# Patient Record
Sex: Female | Born: 1965
Health system: Southern US, Community
[De-identification: ages and names within clinical notes are randomized; demographics above are authoritative.]

## PROBLEM LIST (undated history)

## (undated) DIAGNOSIS — G8929 Other chronic pain: Secondary | ICD-10-CM

## (undated) DIAGNOSIS — E119 Type 2 diabetes mellitus without complications: Secondary | ICD-10-CM

## (undated) DIAGNOSIS — J40 Bronchitis, not specified as acute or chronic: Secondary | ICD-10-CM

## (undated) DIAGNOSIS — M549 Dorsalgia, unspecified: Secondary | ICD-10-CM

## (undated) DIAGNOSIS — F32A Depression, unspecified: Secondary | ICD-10-CM

## (undated) DIAGNOSIS — F329 Major depressive disorder, single episode, unspecified: Secondary | ICD-10-CM

## (undated) DIAGNOSIS — M1990 Unspecified osteoarthritis, unspecified site: Secondary | ICD-10-CM

## (undated) HISTORY — PX: CHOLECYSTECTOMY: SHX55

## (undated) HISTORY — PX: TUBAL LIGATION: SHX77

---

## 1997-12-21 ENCOUNTER — Ambulatory Visit (HOSPITAL_BASED_OUTPATIENT_CLINIC_OR_DEPARTMENT_OTHER): Admission: RE | Admit: 1997-12-21 | Discharge: 1997-12-21 | Payer: Self-pay | Admitting: Orthopedic Surgery

## 1998-01-25 ENCOUNTER — Ambulatory Visit (HOSPITAL_BASED_OUTPATIENT_CLINIC_OR_DEPARTMENT_OTHER): Admission: RE | Admit: 1998-01-25 | Discharge: 1998-01-25 | Payer: Self-pay | Admitting: Orthopedic Surgery

## 1998-10-10 ENCOUNTER — Other Ambulatory Visit: Admission: RE | Admit: 1998-10-10 | Discharge: 1998-10-10 | Payer: Self-pay | Admitting: *Deleted

## 1998-12-09 ENCOUNTER — Inpatient Hospital Stay (HOSPITAL_COMMUNITY): Admission: AD | Admit: 1998-12-09 | Discharge: 1998-12-09 | Payer: Self-pay | Admitting: Obstetrics

## 1999-01-14 ENCOUNTER — Inpatient Hospital Stay (HOSPITAL_COMMUNITY): Admission: AD | Admit: 1999-01-14 | Discharge: 1999-01-14 | Payer: Self-pay | Admitting: Obstetrics

## 1999-04-24 ENCOUNTER — Inpatient Hospital Stay (HOSPITAL_COMMUNITY): Admission: AD | Admit: 1999-04-24 | Discharge: 1999-04-24 | Payer: Self-pay | Admitting: Obstetrics

## 1999-04-26 ENCOUNTER — Inpatient Hospital Stay (HOSPITAL_COMMUNITY): Admission: AD | Admit: 1999-04-26 | Discharge: 1999-04-30 | Payer: Self-pay | Admitting: Obstetrics

## 1999-04-26 ENCOUNTER — Encounter (INDEPENDENT_AMBULATORY_CARE_PROVIDER_SITE_OTHER): Payer: Self-pay | Admitting: Specialist

## 1999-08-31 ENCOUNTER — Emergency Department (HOSPITAL_COMMUNITY): Admission: EM | Admit: 1999-08-31 | Discharge: 1999-08-31 | Payer: Self-pay | Admitting: Emergency Medicine

## 2000-03-14 ENCOUNTER — Emergency Department (HOSPITAL_COMMUNITY): Admission: EM | Admit: 2000-03-14 | Discharge: 2000-03-14 | Payer: Self-pay | Admitting: *Deleted

## 2000-07-09 ENCOUNTER — Emergency Department (HOSPITAL_COMMUNITY): Admission: EM | Admit: 2000-07-09 | Discharge: 2000-07-09 | Payer: Self-pay | Admitting: Emergency Medicine

## 2003-03-21 ENCOUNTER — Encounter: Payer: Self-pay | Admitting: Emergency Medicine

## 2003-03-21 ENCOUNTER — Emergency Department (HOSPITAL_COMMUNITY): Admission: EM | Admit: 2003-03-21 | Discharge: 2003-03-21 | Payer: Self-pay | Admitting: Emergency Medicine

## 2003-11-21 ENCOUNTER — Emergency Department (HOSPITAL_COMMUNITY): Admission: AD | Admit: 2003-11-21 | Discharge: 2003-11-21 | Payer: Self-pay | Admitting: Family Medicine

## 2003-11-24 ENCOUNTER — Emergency Department (HOSPITAL_COMMUNITY): Admission: AD | Admit: 2003-11-24 | Discharge: 2003-11-24 | Payer: Self-pay | Admitting: Family Medicine

## 2005-05-16 ENCOUNTER — Encounter: Admission: RE | Admit: 2005-05-16 | Discharge: 2005-05-16 | Payer: Self-pay | Admitting: Occupational Medicine

## 2005-07-05 ENCOUNTER — Emergency Department (HOSPITAL_COMMUNITY): Admission: EM | Admit: 2005-07-05 | Discharge: 2005-07-05 | Payer: Self-pay | Admitting: Family Medicine

## 2006-07-08 ENCOUNTER — Emergency Department (HOSPITAL_COMMUNITY): Admission: EM | Admit: 2006-07-08 | Discharge: 2006-07-08 | Payer: Self-pay | Admitting: Family Medicine

## 2006-09-04 ENCOUNTER — Emergency Department (HOSPITAL_COMMUNITY): Admission: EM | Admit: 2006-09-04 | Discharge: 2006-09-04 | Payer: Self-pay | Admitting: Family Medicine

## 2006-12-25 ENCOUNTER — Emergency Department (HOSPITAL_COMMUNITY): Admission: EM | Admit: 2006-12-25 | Discharge: 2006-12-25 | Payer: Self-pay | Admitting: Family Medicine

## 2007-01-04 ENCOUNTER — Emergency Department (HOSPITAL_COMMUNITY): Admission: EM | Admit: 2007-01-04 | Discharge: 2007-01-04 | Payer: Self-pay | Admitting: Emergency Medicine

## 2007-03-21 ENCOUNTER — Emergency Department (HOSPITAL_COMMUNITY): Admission: EM | Admit: 2007-03-21 | Discharge: 2007-03-21 | Payer: Self-pay | Admitting: Family Medicine

## 2007-11-19 ENCOUNTER — Emergency Department (HOSPITAL_COMMUNITY): Admission: EM | Admit: 2007-11-19 | Discharge: 2007-11-19 | Payer: Self-pay | Admitting: Family Medicine

## 2007-12-12 ENCOUNTER — Emergency Department (HOSPITAL_COMMUNITY): Admission: EM | Admit: 2007-12-12 | Discharge: 2007-12-12 | Payer: Self-pay | Admitting: Family Medicine

## 2008-03-02 ENCOUNTER — Ambulatory Visit (HOSPITAL_COMMUNITY): Admission: RE | Admit: 2008-03-02 | Discharge: 2008-03-02 | Payer: Self-pay | Admitting: Family Medicine

## 2009-05-30 ENCOUNTER — Emergency Department (HOSPITAL_COMMUNITY): Admission: EM | Admit: 2009-05-30 | Discharge: 2009-05-30 | Payer: Self-pay | Admitting: Emergency Medicine

## 2009-06-10 ENCOUNTER — Emergency Department (HOSPITAL_COMMUNITY): Admission: EM | Admit: 2009-06-10 | Discharge: 2009-06-10 | Payer: Self-pay | Admitting: Family Medicine

## 2010-06-27 ENCOUNTER — Emergency Department (HOSPITAL_COMMUNITY): Admission: EM | Admit: 2010-06-27 | Discharge: 2010-06-27 | Payer: Self-pay | Admitting: Family Medicine

## 2010-07-19 ENCOUNTER — Emergency Department (HOSPITAL_COMMUNITY): Admission: EM | Admit: 2010-07-19 | Discharge: 2010-07-19 | Payer: Self-pay | Admitting: Family Medicine

## 2010-11-19 ENCOUNTER — Emergency Department (HOSPITAL_COMMUNITY)
Admission: EM | Admit: 2010-11-19 | Discharge: 2010-11-20 | Disposition: A | Discharge status: Home / Self Care | Payer: BC Managed Care – PPO | Attending: Emergency Medicine | Admitting: Emergency Medicine

## 2010-11-19 DIAGNOSIS — M545 Low back pain, unspecified: Secondary | ICD-10-CM | POA: Insufficient documentation

## 2010-11-19 DIAGNOSIS — R52 Pain, unspecified: Secondary | ICD-10-CM

## 2010-11-19 DIAGNOSIS — R109 Unspecified abdominal pain: Secondary | ICD-10-CM | POA: Insufficient documentation

## 2010-11-19 DIAGNOSIS — N898 Other specified noninflammatory disorders of vagina: Secondary | ICD-10-CM | POA: Insufficient documentation

## 2010-11-19 LAB — WET PREP, GENITAL
Clue Cells Wet Prep HPF POC: NONE SEEN
Trich, Wet Prep: NONE SEEN
Yeast Wet Prep HPF POC: NONE SEEN

## 2010-11-20 ENCOUNTER — Encounter (HOSPITAL_COMMUNITY): Payer: Self-pay

## 2010-11-20 ENCOUNTER — Emergency Department (HOSPITAL_COMMUNITY): Payer: BC Managed Care – PPO

## 2010-11-20 LAB — URINE MICROSCOPIC-ADD ON

## 2010-11-20 LAB — COMPREHENSIVE METABOLIC PANEL
ALT: 25 U/L (ref 0–35)
Albumin: 3.5 g/dL (ref 3.5–5.2)
Alkaline Phosphatase: 56 U/L (ref 39–117)
BUN: 15 mg/dL (ref 6–23)
Calcium: 8.9 mg/dL (ref 8.4–10.5)
Chloride: 105 mEq/L (ref 96–112)
Creatinine, Ser: 0.64 mg/dL (ref 0.4–1.2)
GFR calc non Af Amer: >60 mL/min (ref >60)
Glucose, Bld: 123 mg/dL — ABNORMAL HIGH (ref 70–99)
Potassium: 4.1 mEq/L (ref 3.5–5.1)
Total Protein: 7.1 g/dL (ref 6.0–8.3)

## 2010-11-20 LAB — URINALYSIS, ROUTINE W REFLEX MICROSCOPIC
Bilirubin Urine: NEGATIVE
Glucose, UA: NEGATIVE mg/dL
Ketones, ur: NEGATIVE mg/dL
Nitrite: NEGATIVE
Protein, ur: NEGATIVE mg/dL
Urobilinogen, UA: 1 mg/dL (ref 0.0–1.0)
pH: 7 (ref 5.0–8.0)

## 2010-11-20 LAB — CBC
HCT: 37.8 % (ref 36.0–46.0)
Hemoglobin: 12.2 g/dL (ref 12.0–15.0)
MCH: 27.5 pg (ref 26.0–34.0)
MCHC: 32.3 g/dL (ref 30.0–36.0)
MCV: 85.3 fL (ref 78.0–100.0)
Platelets: 184 10*3/uL (ref 150–400)
RBC: 4.43 MIL/uL (ref 3.87–5.11)
RDW: 14 % (ref 11.5–15.5)

## 2010-11-20 LAB — DIFFERENTIAL
Basophils Absolute: 0 10*3/uL (ref 0.0–0.1)
Basophils Relative: 0 % (ref 0–1)
Eosinophils Absolute: 0.2 10*3/uL (ref 0.0–0.7)
Eosinophils Relative: 2 % (ref 0–5)
Lymphocytes Relative: 33 % (ref 12–46)
Lymphs Abs: 2.5 10*3/uL (ref 0.7–4.0)
Monocytes Absolute: 0.4 10*3/uL (ref 0.1–1.0)
Monocytes Relative: 5 % (ref 3–12)
Neutrophils Relative %: 59 % (ref 43–77)

## 2010-11-20 LAB — POCT PREGNANCY, URINE: Preg Test, Ur: NEGATIVE

## 2010-11-20 LAB — LIPASE, BLOOD: Lipase: 68 U/L — ABNORMAL HIGH (ref 11–59)

## 2010-11-21 LAB — GC/CHLAMYDIA PROBE AMP, GENITAL
Chlamydia, DNA Probe: NEGATIVE
GC Probe Amp, Genital: NEGATIVE

## 2010-12-25 ENCOUNTER — Inpatient Hospital Stay (INDEPENDENT_AMBULATORY_CARE_PROVIDER_SITE_OTHER)
Admission: RE | Admit: 2010-12-25 | Discharge: 2010-12-25 | Disposition: A | Discharge status: Home / Self Care | Payer: BC Managed Care – PPO | Source: Ambulatory Visit | Attending: Family Medicine | Admitting: Family Medicine

## 2010-12-25 DIAGNOSIS — M542 Cervicalgia: Secondary | ICD-10-CM

## 2010-12-25 DIAGNOSIS — S139XXA Sprain of joints and ligaments of unspecified parts of neck, initial encounter: Secondary | ICD-10-CM

## 2011-06-04 LAB — POCT RAPID STREP A: Streptococcus, Group A Screen (Direct): NEGATIVE

## 2011-06-21 ENCOUNTER — Ambulatory Visit (INDEPENDENT_AMBULATORY_CARE_PROVIDER_SITE_OTHER): Payer: BC Managed Care – PPO

## 2011-06-21 ENCOUNTER — Inpatient Hospital Stay (INDEPENDENT_AMBULATORY_CARE_PROVIDER_SITE_OTHER)
Admission: RE | Admit: 2011-06-21 | Discharge: 2011-06-21 | Disposition: A | Discharge status: Home / Self Care | Payer: BC Managed Care – PPO | Source: Ambulatory Visit | Attending: Family Medicine | Admitting: Family Medicine

## 2011-06-21 DIAGNOSIS — J45909 Unspecified asthma, uncomplicated: Secondary | ICD-10-CM

## 2011-06-25 ENCOUNTER — Inpatient Hospital Stay (INDEPENDENT_AMBULATORY_CARE_PROVIDER_SITE_OTHER)
Admission: RE | Admit: 2011-06-25 | Discharge: 2011-06-25 | Disposition: A | Discharge status: Home / Self Care | Payer: BC Managed Care – PPO | Source: Ambulatory Visit | Attending: Family Medicine | Admitting: Family Medicine

## 2011-06-25 DIAGNOSIS — J45909 Unspecified asthma, uncomplicated: Secondary | ICD-10-CM

## 2011-07-31 ENCOUNTER — Emergency Department (INDEPENDENT_AMBULATORY_CARE_PROVIDER_SITE_OTHER)
Admission: EM | Admit: 2011-07-31 | Discharge: 2011-07-31 | Disposition: A | Discharge status: Home / Self Care | Payer: BC Managed Care – PPO | Source: Home / Self Care | Attending: Emergency Medicine | Admitting: Emergency Medicine

## 2011-07-31 ENCOUNTER — Encounter (HOSPITAL_COMMUNITY): Payer: Self-pay | Admitting: *Deleted

## 2011-07-31 DIAGNOSIS — J3489 Other specified disorders of nose and nasal sinuses: Secondary | ICD-10-CM

## 2011-07-31 DIAGNOSIS — R0981 Nasal congestion: Secondary | ICD-10-CM

## 2011-07-31 DIAGNOSIS — R05 Cough: Secondary | ICD-10-CM

## 2011-07-31 HISTORY — DX: Depression, unspecified: F32.A

## 2011-07-31 HISTORY — DX: Unspecified osteoarthritis, unspecified site: M19.90

## 2011-07-31 HISTORY — DX: Major depressive disorder, single episode, unspecified: F32.9

## 2011-07-31 HISTORY — DX: Bronchitis, not specified as acute or chronic: J40

## 2011-07-31 MED ORDER — FEXOFENADINE HCL 60 MG PO TABS: 180.0000 mg | ORAL_TABLET | Freq: Every day | ORAL | Status: DC

## 2011-07-31 MED ORDER — PREDNISONE 20 MG PO TABS: 40.0000 mg | ORAL_TABLET | Freq: Every day | ORAL | Status: AC

## 2011-07-31 MED ORDER — FEXOFENADINE HCL 60 MG PO TABS: 180.0000 mg | ORAL_TABLET | Freq: Once | ORAL | Status: DC

## 2011-07-31 MED ORDER — FEXOFENADINE HCL 60 MG PO TABS: 60.0000 mg | ORAL_TABLET | Freq: Two times a day (BID) | ORAL | Status: DC

## 2011-07-31 NOTE — ED Notes (Signed)
Pt reports having  Bronchitis  several weeks ago.  She did get some better, but today c/o pressure in her sinus, productive cough--thick sputum with streaks of blood--, achy back, neck and  Shoulders.  denies fever.  She tried albuterol inhaler with only partial relief

## 2011-07-31 NOTE — ED Provider Notes (Signed)
History     CSN: [id number] Arrival date & time: 07/31/2011 10:20 AM   First MD Initiated Contact with Patient 07/31/11 [phone]      Chief Complaint  Patient presents with  . URI    (HPI Comments: Pressure between my nose and head congested" x 2 days coughing up with streaks of blood and green looking phlegm, can't see my doctor cause i ow them 100 dollars"  Patient is a 45 y.o. female presenting with URI. The history is provided by the patient. No language interpreter was used.  URI The primary symptoms include headaches, sore throat and cough. Primary symptoms do not include fever, fatigue, ear pain, swollen glands, wheezing, nausea, vomiting, myalgias, arthralgias or rash. The current episode started 2 days ago. This is a new problem.  Symptoms associated with the illness include facial pain, sinus pressure, congestion and rhinorrhea. The illness is not associated with chills or plugged ear sensation.    Past Medical History  Diagnosis Date  . Bronchitis   . Depression   . Osteoarthritis     Past Surgical History  Procedure Date  . Cholecystectomy   . Cesarean section   . Tubal ligation     Family History  Problem Relation Age of Onset  . Cancer Father     History  Substance Use Topics  . Smoking status: Former Smoker    Quit date: 07/31/2007  . Smokeless tobacco: Not on file  . Alcohol Use: No    OB History    Grav Para Term Preterm Abortions TAB SAB Ect Mult Living                  Review of Systems  Constitutional: Negative for fever, chills and fatigue.  HENT: Positive for congestion, sore throat, rhinorrhea, postnasal drip and sinus pressure. Negative for ear pain and sneezing.   Respiratory: Positive for cough. Negative for chest tightness and wheezing.   Gastrointestinal: Negative for nausea and vomiting.  Musculoskeletal: Negative for myalgias and arthralgias.  Skin: Negative for rash.  Neurological: Positive for headaches.    Allergies  Biaxin  and Sulfa antibiotics  Home Medications   Current Outpatient Rx  Name Route Sig Dispense Refill  . ALBUTEROL SULFATE HFA 108 (90 BASE) MCG/ACT IN AERS Inhalation Inhale 2 puffs into the lungs every 6 (six) hours as needed.      Marland Kitchen CITALOPRAM HYDROBROMIDE 10 MG PO TABS Oral Take 10 mg by mouth daily.      Marland Kitchen DICLOFENAC SODIUM 25 MG PO TBEC Oral Take 25 mg by mouth.      . TRAMADOL HCL 50 MG PO TABS Oral Take 50 mg by mouth every 6 (six) hours as needed. Maximum dose= 8 tablets per day       BP 128/70  Pulse 88  Temp(Src) 97.9 F (36.6 C) (Oral)  Resp 18  SpO2 100%  Physical Exam  Constitutional: No distress.  HENT:  Head: Normocephalic.  Mouth/Throat: No oropharyngeal exudate.  Eyes: Conjunctivae are normal.  Neck: Trachea normal and normal range of motion. No mass present.  Cardiovascular: Normal rate.   Pulmonary/Chest: Effort normal and breath sounds normal. No respiratory distress. She has no wheezes. She has no rales.  Lymphadenopathy:    She has no cervical adenopathy.  Neurological: She is alert.    ED Course  Procedures (including critical care time)  Labs Reviewed - No data to display No results found.   No diagnosis found.    MDM  Cough-sinus  congestion x 2 days- requesting work note-        Jimmie Molly, MD 07/31/11 [phone]

## 2011-07-31 NOTE — ED Notes (Signed)
[facility] pharmacy sent fax asking to verify quantity on Allegra.  Dr. Artis Flock said to change it to # 30.  I called [phone] and gave information to the Pharmacist. Vassie Moselle 07/31/2011

## 2011-10-10 ENCOUNTER — Emergency Department (INDEPENDENT_AMBULATORY_CARE_PROVIDER_SITE_OTHER)
Admission: EM | Admit: 2011-10-10 | Discharge: 2011-10-10 | Disposition: A | Discharge status: Home / Self Care | Payer: BC Managed Care – PPO | Source: Home / Self Care | Attending: Family Medicine | Admitting: Family Medicine

## 2011-10-10 ENCOUNTER — Encounter (HOSPITAL_COMMUNITY): Payer: Self-pay | Admitting: *Deleted

## 2011-10-10 DIAGNOSIS — F32A Depression, unspecified: Secondary | ICD-10-CM

## 2011-10-10 DIAGNOSIS — F329 Major depressive disorder, single episode, unspecified: Secondary | ICD-10-CM

## 2011-10-10 DIAGNOSIS — G8929 Other chronic pain: Secondary | ICD-10-CM

## 2011-10-10 DIAGNOSIS — M549 Dorsalgia, unspecified: Secondary | ICD-10-CM

## 2011-10-10 MED ORDER — CITALOPRAM HYDROBROMIDE 10 MG PO TABS: 10.0000 mg | ORAL_TABLET | Freq: Every day | ORAL | Status: DC

## 2011-10-10 MED ORDER — TRAMADOL HCL 50 MG PO TABS: 50.0000 mg | ORAL_TABLET | Freq: Four times a day (QID) | ORAL | Status: DC | PRN

## 2011-10-10 MED ORDER — DICLOFENAC SODIUM 50 MG PO TBEC: 50.0000 mg | DELAYED_RELEASE_TABLET | Freq: Three times a day (TID) | ORAL | Status: AC | PRN

## 2011-10-10 NOTE — ED Provider Notes (Signed)
History     CSN: [id number]  Arrival date & time 10/10/11  0865   First MD Initiated Contact with Patient 10/10/11 [phone]      Chief Complaint  Patient presents with  . Headache  . Leg Pain  . Back Pain    (Consider location/radiation/quality/duration/timing/severity/associated sxs/prior treatment) HPI Comments: Amanda Larson presents for refill of her chronic medications. She is in the process of changing providers after her recent practice refused to see her secondary to scheduling conflicts and outstanding payments. She reports a hx of chronic back pain, secondary to arthritis, and depression. She takes diclofenac daily and citalopram. She uses tramadol as needed. She denies any exacerbation or change in her chronic symptoms.   Patient is a 46 y.o. female presenting with back pain. The history is provided by the patient.  Back Pain  This is a chronic problem. The current episode started more than 1 week ago. The problem occurs constantly. The problem has not changed since onset.The pain is associated with no known injury. The pain is moderate. The symptoms are aggravated by bending and twisting. Pertinent negatives include no numbness, no bowel incontinence, no perianal numbness, no paresthesias, no paresis, no tingling and no weakness. She has tried analgesics and NSAIDs for the symptoms. Risk factors include obesity and lack of exercise.    Past Medical History  Diagnosis Date  . Bronchitis   . Depression   . Osteoarthritis     Past Surgical History  Procedure Date  . Cholecystectomy   . Cesarean section   . Tubal ligation     Family History  Problem Relation Age of Onset  . Cancer Father     History  Substance Use Topics  . Smoking status: Former Smoker    Quit date: 07/31/2007  . Smokeless tobacco: Not on file  . Alcohol Use: No    OB History    Grav Para Term Preterm Abortions TAB SAB Ect Mult Living                  Review of Systems  Constitutional:  Negative.   HENT: Negative.   Eyes: Negative.   Respiratory: Negative.   Cardiovascular: Negative.   Gastrointestinal: Negative.  Negative for bowel incontinence.  Genitourinary: Negative.   Musculoskeletal: Positive for back pain.  Skin: Negative.   Neurological: Negative.  Negative for tingling, weakness, numbness and paresthesias.  Psychiatric/Behavioral: Negative.     Allergies  Biaxin and Sulfa antibiotics  Home Medications   Current Outpatient Rx  Name Route Sig Dispense Refill  . ALBUTEROL SULFATE HFA 108 (90 BASE) MCG/ACT IN AERS Inhalation Inhale 2 puffs into the lungs every 6 (six) hours as needed.      Marland Kitchen FEXOFENADINE HCL 60 MG PO TABS Oral Take 1 tablet (60 mg total) by mouth 2 (two) times daily. X 10 days 20 tablet 0  . IBUPROFEN 800 MG PO TABS Oral Take 800 mg by mouth every 8 (eight) hours as needed. Taking 5 at a time    . CITALOPRAM HYDROBROMIDE 10 MG PO TABS Oral Take 1 tablet (10 mg total) by mouth daily. 30 tablet 0  . DICLOFENAC SODIUM 50 MG PO TBEC Oral Take 1 tablet (50 mg total) by mouth 3 (three) times daily as needed. 60 tablet 0  . TRAMADOL HCL 50 MG PO TABS Oral Take 1 tablet (50 mg total) by mouth every 6 (six) hours as needed. Maximum dose= 8 tablets per day 30 tablet 0    BP  133/88  Pulse 89  Temp(Src) 98.8 F (37.1 C) (Oral)  Resp 20  SpO2 96%  Physical Exam  Nursing note and vitals reviewed. Constitutional: She is oriented to person, place, and time. She appears well-developed and well-nourished.  HENT:  Head: Normocephalic and atraumatic.  Eyes: EOM are normal.  Neck: Normal range of motion.  Pulmonary/Chest: Effort normal.  Musculoskeletal: Normal range of motion.       Lumbar back: She exhibits tenderness, bony tenderness and pain. She exhibits no swelling and no spasm.  Neurological: She is alert and oriented to person, place, and time.  Skin: Skin is warm and dry.  Psychiatric: Her behavior is normal.    ED Course  Procedures  (including critical care time)  Labs Reviewed - No data to display No results found.   1. Chronic back pain   2. Depression       MDM  Refilled diclofenac, citalopram, and tramadol; referred to [facility]        Richardo Priest, MD 10/10/11 [phone]

## 2011-10-10 NOTE — ED Notes (Signed)
Pt with c/o soreness legs/back pain / headache x one week - taking otc ibuprofen per pt ran out of medications diclofenac/citalopram/tramadol - per pt diclofenac relieves her pain - needs referral for new primary care physician

## 2011-12-12 ENCOUNTER — Emergency Department (INDEPENDENT_AMBULATORY_CARE_PROVIDER_SITE_OTHER)
Admission: EM | Admit: 2011-12-12 | Discharge: 2011-12-12 | Disposition: A | Discharge status: Home / Self Care | Payer: BC Managed Care – PPO | Source: Home / Self Care | Attending: Emergency Medicine | Admitting: Emergency Medicine

## 2011-12-12 ENCOUNTER — Encounter (HOSPITAL_COMMUNITY): Payer: Self-pay | Admitting: *Deleted

## 2011-12-12 DIAGNOSIS — L409 Psoriasis, unspecified: Secondary | ICD-10-CM

## 2011-12-12 DIAGNOSIS — L408 Other psoriasis: Secondary | ICD-10-CM

## 2011-12-12 DIAGNOSIS — M722 Plantar fascial fibromatosis: Secondary | ICD-10-CM

## 2011-12-12 DIAGNOSIS — I872 Venous insufficiency (chronic) (peripheral): Secondary | ICD-10-CM

## 2011-12-12 LAB — POCT I-STAT, CHEM 8
BUN: 18 mg/dL (ref 6–23)
Calcium, Ion: 1.2 mmol/L (ref 1.12–1.32)
Chloride: 107 mEq/L (ref 96–112)
Glucose, Bld: 101 mg/dL — ABNORMAL HIGH (ref 70–99)

## 2011-12-12 LAB — POCT URINALYSIS DIP (DEVICE)
Bilirubin Urine: NEGATIVE
Hgb urine dipstick: NEGATIVE
Ketones, ur: 15 mg/dL — AB
pH: 7 (ref 5.0–8.0)

## 2011-12-12 MED ORDER — DICLOFENAC SODIUM 75 MG PO TBEC: 75.0000 mg | DELAYED_RELEASE_TABLET | Freq: Two times a day (BID) | ORAL | Status: AC

## 2011-12-12 MED ORDER — TRAMADOL HCL 50 MG PO TABS: 100.0000 mg | ORAL_TABLET | Freq: Three times a day (TID) | ORAL | Status: DC | PRN

## 2011-12-12 MED ORDER — TRIAMCINOLONE ACETONIDE 0.1 % EX CREA: TOPICAL_CREAM | Freq: Three times a day (TID) | CUTANEOUS | Status: AC

## 2011-12-12 NOTE — Discharge Instructions (Signed)
Do stretching exercises to right foot twice daily followed by ice massage.  Wear heal pad or heel cup.  Wear knee high support hose.  Take Venastat daily for swelling.  Plantar Fasciitis Plantar fasciitis is a common condition that causes foot pain. It is soreness (inflammation) of the band of tough fibrous tissue on the bottom of the foot that runs from the heel bone (calcaneus) to the ball of the foot. The cause of this soreness may be from excessive standing, poor fitting shoes, running on hard surfaces, being overweight, having an abnormal walk, or overuse (this is common in runners) of the painful foot or feet. It is also common in aerobic exercise dancers and ballet dancers. SYMPTOMS  Most people with plantar fasciitis complain of:  Severe pain in the morning on the bottom of their foot especially when taking the first steps out of bed. This pain recedes after a few minutes of walking.   Severe pain is experienced also during walking following a long period of inactivity.   Pain is worse when walking barefoot or up stairs  DIAGNOSIS   Your caregiver will diagnose this condition by examining and feeling your foot.   Special tests such as X-rays of your foot, are usually not needed.  PREVENTION   Consult a sports medicine professional before beginning a new exercise program.   Walking programs offer a good workout. With walking there is a lower chance of overuse injuries common to runners. There is less impact and less jarring of the joints.   Begin all new exercise programs slowly. If problems or pain develop, decrease the amount of time or distance until you are at a comfortable level.   Wear good shoes and replace them regularly.   Stretch your foot and the heel cords at the back of the ankle (Achilles tendon) both before and after exercise.   Run or exercise on even surfaces that are not hard. For example, asphalt is better than pavement.   Do not run barefoot on hard  surfaces.   If using a treadmill, vary the incline.   Do not continue to workout if you have foot or joint problems. Seek professional help if they do not improve.  HOME CARE INSTRUCTIONS   Avoid activities that cause you pain until you recover.   Use ice or cold packs on the problem or painful areas after working out.   Only take over-the-counter or prescription medicines for pain, discomfort, or fever as directed by your caregiver.   Soft shoe inserts or athletic shoes with air or gel sole cushions may be helpful.   If problems continue or become more severe, consult a sports medicine caregiver or your own health care provider. Cortisone is a potent anti-inflammatory medication that may be injected into the painful area. You can discuss this treatment with your caregiver.  MAKE SURE YOU:   Understand these instructions.   Will watch your condition.   Will get help right away if you are not doing well or get worse.  Document Released: 05/22/2001 Document Revised: 08/16/2011 Document Reviewed: 07/21/2008 [facility] Patient Information 2012 [place], [place].Psoriasis Psoriasis is a common, long-lasting (chronic) inflammation of the skin. It affects both men and women equally, of all ages and all races. Psoriasis cannot be passed from person to person (not contagious). Psoriasis varies from mild to very severe. When severe, it can greatly affect your quality of life. Psoriasis is an inflammatory disorder affecting the skin as well as other organs including the  joints (causing an arthritis). With psoriasis, the skin sheds its top layer of cells more rapidly than it does in someone without psoriasis. CAUSES  The cause of psoriasis is largely unknown. Genetics, your immune system, and the environment seem to play a role in causing psoriasis. Factors that can make psoriasis worse include:  Damage or trauma to the skin, such as cuts, scrapes, and sunburn. This damage often causes new areas of  psoriasis (lesions).   Winter dryness and lack of sunlight.   Medicines such as lithium, beta-blockers, antimalarial drugs, ACE inhibitors, nonsteroidal anti-inflammatory drugs (ibuprofen, aspirin), and terbinafine. Let your caregiver know if you are taking any of these drugs.   Alcohol. Excessive alcohol use should be avoided if you have psoriasis. Drinking large amounts of alcohol can affect:   How well your psoriasis treatment works.   How safe your psoriasis treatment is.   Smoking. If you smoke, ask your caregiver for help to quit.   Stress.   Bacterial or viral infections.   Arthritis. Arthritis associated with psoriasis (psoriatic arthritis) affects less than 10% of patients with psoriasis. The arthritic intensity does not always match the skin psoriasis intensity. It is important to let your caregiver know if your joints hurt or if they are stiff.  SYMPTOMS  The most common form of psoriasis begins with little red bumps that gradually become larger. The bumps begin to form scales that flake off easily. The lower layers of scales stick together. When these scales are scratched or removed, the underlying skin is tender and bleeds easily. These areas then grow in size and may become large. Psoriasis often creates a rash that looks the same on both sides of the body (symmetrical). It often affects the elbows, knees, groin, genitals, arms, legs, scalp, and nails. Affected nails often have pitting, loosen, thicken, crumble, and are difficult to treat.  "Inverse psoriasis"occurs in the armpits, under breasts, in skin folds, and around the groin, buttocks, and genitals.   "Guttate psoriasis" generally occurs in children and young adults following a recent sore throat (strep throat). It begins with many small, red, scaly spots on the skin. It clears spontaneously in weeks or a few months without treatment.  DIAGNOSIS  Psoriasis is diagnosed by physical exam. A tissue sample (biopsy) may  also be taken. TREATMENT The treatment of psoriasis depends on your age, health, and living conditions.  Steroid (cortisone) creams, lotions, and ointments may be used. These treatments are associated with thinning of the skin, blood vessels that get larger (dilated), loss of skin pigmentation, and easy bruising. It is important to use these steroids as directed by your caregiver. Only treat the affected areas and not the normal, unaffected skin. People on long-term steroid treatment should wear a medical alert bracelet. Injections may be used in areas that are difficult to treat.   Scalp treatments are available as shampoos, solutions, sprays, foams, and oils. Avoid scratching the scalp and picking at the scales.   Anthralin medicine works well on areas that are difficult to treat. However, it stains clothes and skin and may cause temporary irritation.   Synthetic vitamin D (calcipotriene)can be used on small areas. It is available by prescription. The forms of synthetic vitamin D available in health food stores do not help with psoriasis.   Coal tarsare available in various strengths for psoriasis that is difficult to treat. They are one of the longest used treatments for difficult to treat psoriasis. However, they are messy to use.  Light therapy (UV therapy) can be carefully and professionally monitored in a dermatologist's office. Careful sunbathing is helpful for many people as directed by your caregiver. The exposure should be just long enough to cause a mild redness (erythema) of your skin. Avoid sunburn as this may make the condition worse. Sunscreen (SPF of 30 or higher) should be used to protect against sunburn. Cataracts, wrinkles, and skin aging are some of the harmful side effects of light therapy.   If creams (topical medicines) fail, there are several other options for systemic or oral medicines your caregiver can suggest.  Psoriasis can sometimes be very difficult to treat. It  can come and go. It is necessary to follow up with your caregiver regularly if your psoriasis is difficult to treat. Usually, with persistence you can get a good amount of relief. Maintaining consistent care is important. Do not change caregivers just because you do not see immediate results. It may take several trials to find the right combination of treatment for you. PREVENTING FLARE-UPS  Wear gloves while you wash dishes, while cleaning, and when you are outside in the cold.   If you have radiators, place a bowl of water or damp towel on the radiator. This will help put water back in the air. You can also use a humidifier to keep the air moist. Try to keep the humidity at about 60% in your home.   Apply moisturizer while your skin is still damp from bathing or showering. This traps water in the skin.   Avoid long, hot baths or showers. Keep soap use to a minimum. Soaps dry out the skin and wash away the protective oils. Use a fragrance free, dye free soap.   Drink enough water and fluids to keep your urine clear or pale yellow. Not drinking enough water depletes your skin's water supply.   Turn off the heat at night and keep it low during the day. Cool air is less drying.  SEEK MEDICAL CARE IF:  You have increasing pain in the affected areas.   You have uncontrolled bleeding in the affected areas.   You have increasing redness or warmth in the affected areas.   You start to have pain or stiffness in your joints.   You start feeling depressed about your condition.   You have a fever.  Document Released: 08/24/2000 Document Revised: 08/16/2011 Document Reviewed: 02/19/2011 [facility] Patient Information 2012 [place], [place].Venous Stasis and Chronic Venous Insufficiency As people age, the veins located in their legs may weaken and stretch. When veins weaken and lose the ability to pump blood effectively, the condition is called chronic venous insufficiency (CVI) or venous stasis. Almost  all veins return blood back to the heart. This happens by:  The force of the heart pumping fresh blood pushes blood back to the heart.   Blood flowing to the heart from the force of gravity.  In the deep veins of the legs, blood has to fight gravity and flow upstream back to the heart. Here, the leg muscles contract to pump blood back toward the heart. Vein walls are elastic, and many veins have small valves that only allow blood to flow in one direction. When leg muscles contract, they push inward against the elastic vein walls. This squeezes blood upward, opens the valves, and moves blood toward the heart. When leg muscles relax, the vein wall also relaxes and the valves inside the vein close to prevent blood from flowing backward. This method of pumping blood out  of the legs is called the venous pump. CAUSES  The venous pump works best while walking and leg muscles are contracting. But when a person sits or stands, blood pressure in leg veins can build. Deep veins are usually able to withstand short periods of inactivity, but long periods of inactivity (and increased pressure) can stretch, weaken, and damage vein walls. High blood pressure can also stretch and damage vein walls. The veins may no longer be able to pump blood back to the heart. Venous hypertension (high blood pressure inside veins) that lasts over time is a primary cause of CVI. CVI can also be caused by:   Deep vein thrombosis, a condition where a thrombus (blood clot) blocks blood flow in a vein.   Phlebitis, an inflammation of a superficial vein that causes a blood clot to form.  Other risk factors for CVI may include:   Heredity.   Obesity.   Pregnancy.   Sedentary lifestyle.   Smoking.   Jobs requiring long periods of standing or sitting in one place.   Age and gender:   Women in their 31's and 42's and men in their 25's are more prone to developing CVI.  SYMPTOMS  Symptoms of CVI may include:   Varicose veins.     Ulceration or skin breakdown.   Lipodermatosclerosis, a condition that affects the skin just above the ankle, usually on the inside surface. Over time the skin becomes brown, smooth, tight and often painful. Those with this condition have a high risk of developing skin ulcers.   Reddened or discolored skin on the leg.   Swelling.  DIAGNOSIS  Your caregiver can diagnose CVI after performing a careful medical history and physical examination. To confirm the diagnosis, the following tests may also be ordered:   Duplex ultrasound.   Plethysmography (tests blood flow).   Venograms (x-ray using a special dye).  TREATMENT The goals of treatment for CVI are to restore a person to an active life and to minimize pain or disability. Typically, CVI does not pose a serious threat to life or limb, and with proper treatment most people with this condition can continue to lead active lives. In most cases, mild CVI can be treated on an outpatient basis with simple procedures. Treatment methods include:   Elastic compression socks.   Sclerotherapy, a procedure involving an injection of a material that "dissolves" the damaged veins. Other veins in the network of blood vessels take over the function of the damaged veins.   Vein stripping (an older procedure less commonly used).   Laser Ablation surgery.   Valve repair.  HOME CARE INSTRUCTIONS   Elastic compression socks must be worn every day. They can help with symptoms and lower the chances of the problem getting worse, but they do not cure the problem.   Only take over-the-counter or prescription medicines for pain, discomfort, or fever as directed by your caregiver.   Your caregiver will review your other medications with you.  SEEK MEDICAL CARE IF:   You are confused about how to take your medications.   There is redness, swelling, or increasing pain in the affected area.   There is a red streak or line that extends up or down from the  affected area.   There is a breakdown or loss of skin in the affected area, even if the breakdown is small.   You develop an unexplained oral temperature above 102 F (38.9 C).   There is an injury to the  affected area.  SEEK IMMEDIATE MEDICAL CARE IF:   There is an injury and open wound to the affected area.   Pain is not adequately relieved with pain medication prescribed or becomes severe.   An oral temperature above 102 F (38.9 C) develops.   The foot/ankle below the affected area becomes suddenly numb or the area feels weak and hard to move.  MAKE SURE YOU:   Understand these instructions.   Will watch your condition.   Will get help right away if you are not doing well or get worse.  Document Released: 12/31/2006 Document Revised: 08/16/2011 Document Reviewed: 03/10/2007 [facility] Patient Information 2012 [place], [place].

## 2011-12-12 NOTE — ED Provider Notes (Signed)
Chief Complaint  Patient presents with  . Leg Swelling    History of Present Illness:  The patient is a 46 year old female with psoriasis and arthritis who has had aching in her right foot for the past 3-4 weeks. The pain is localized to the plantar surface of the heel. It hurts her to stand. The pain is sometimes severe. Because of this she's been favoring her right foot and bearing more weight on her left foot, and since then she's also had pain over the dorsum of her left foot which shoots up her leg towards her knee. It hurts her to stand for any length of time and she has to sit down to do her job. She's been out of arthritis meds including diclofenac and tramadol. She has arthritis in her back and legs. Also for the past 3 or 4 weeks she has swelling in her ankles and feet, right worse than left. She denies any PND or orthopnea. She's had no history of congestive heart failure cardiac problems of any kind. No history of kidney problems, hypothyroidism, or liver disease. She denies any calf pain. She's had no chest pain or shortness of breath. No bowel pain or swelling. She does have psoriasis and she uses over-the-counter meds for this but it's not under very good control.  Review of Systems:  Other than noted above, the patient denies any of the following symptoms: Systemic:  No fever, chills, sweats, weight gain or loss. Respiratory:  No coughing, wheezing, or shortness of breath. Cardiac:  No chest pain, tightness, pressure or syncope. GI:  No abdominal pain, swelling, distension, nausea, or vomiting. GU:  No dysuria, frequency, or hematuria. Ext:  No joint pain, muscle pain, or weakness. Skin:  No rash or itching. Neuro:  No paresthesias.   PMFSH:  Past medical history, family history, social history, meds, and allergies were reviewed.  Physical Exam:   Vital signs:  BP 159/78  Pulse 91  Temp(Src) 97.1 F (36.2 C) (Oral)  Resp 18  SpO2 96% Gen:  Alert, oriented, in no  distress. Neck:  No tenderness, adenopathy, or JVD. Lungs:  Breath sounds clear and equal bilaterally.  No rales, rhonchi or wheezes. Heart:  Regular rhythm, no gallops or murmers. Abdomen:  Soft, nontender, no organomegaly or mass. Ext:  She has pitting edema of both ankles, right more so than left. No calf tenderness and Homans sign is negative. There is pain to palpation over the insertion of the plantar fascia on the right heel. No pain over the ankle joints or of the Achilles tendon. She does have some pain around the sides of the calcaneus as well. No pain to palpation over the MTP joints. Neuro:  Alert and oriented times 3.  No muscle weakness.  Sensation intact to light touch. Skin:  Warm and dry.  No rash or skin lesions.  Labs:   Results for orders placed during the hospital encounter of 12/12/11  POCT URINALYSIS DIP (DEVICE)      Component Value Range   Glucose, UA NEGATIVE  NEGATIVE (mg/dL)   Bilirubin Urine NEGATIVE  NEGATIVE    Ketones, ur 15 (*) NEGATIVE (mg/dL)   Specific Gravity, Urine 1.020  1.005 - 1.030    Hgb urine dipstick NEGATIVE  NEGATIVE    pH 7.0  5.0 - 8.0    Protein, ur NEGATIVE  NEGATIVE (mg/dL)   Urobilinogen, UA 4.0 (*) 0.0 - 1.0 (mg/dL)   Nitrite NEGATIVE  NEGATIVE    Leukocytes, UA NEGATIVE  NEGATIVE   POCT I-STAT, CHEM 8      Component Value Range   Sodium 144  135 - 145 (mEq/L)   Potassium 3.9  3.5 - 5.1 (mEq/L)   Chloride 107  96 - 112 (mEq/L)   BUN 18  6 - 23 (mg/dL)   Creatinine, Ser 5.78  0.50 - 1.10 (mg/dL)   Glucose, Bld 469 (*) 70 - 99 (mg/dL)   Calcium, Ion 6.29  1.12 - 1.32 (mmol/L)   TCO2 26  0 - 100 (mmol/L)   Hemoglobin 13.6  12.0 - 15.0 (g/dL)   HCT 52.8  41.3 - 24.4 (%)    Assessment:  The primary encounter diagnosis was Plantar fasciitis of right foot. Diagnoses of Venous insufficiency and Psoriasis were also pertinent to this visit.  Plan:   1.  The following meds were prescribed:   New Prescriptions   DICLOFENAC (VOLTAREN)  75 MG EC TABLET    Take 1 tablet (75 mg total) by mouth 2 (two) times daily.   TRAMADOL (ULTRAM) 50 MG TABLET    Take 2 tablets (100 mg total) by mouth every 8 (eight) hours as needed for pain.   TRIAMCINOLONE CREAM (KENALOG) 0.1 %    Apply topically 3 (three) times daily.   2.  The patient was instructed in symptomatic care and handouts were given. 3.  The patient was told to return if becoming worse in any way, if no better in 3 or 4 days, and given some red flag symptoms that would indicate earlier return. 4.  She was told to do stretching exercises to the plantar fascia twice daily followed by ice massage. I also suggested use of a heel pad or heel cup. For the edema I suggested knee-high support hose and use of Venastat. I also suggested she try to minimize salt and sodium intake and try to lose weight. I suggested she followup with her primary care doctor soon as possible.    Reuben Likes, MD 12/12/11 2129

## 2011-12-12 NOTE — ED Notes (Signed)
Pt  Reports   Pain /  Swelling  Of  Both  Feet  And  Legs      She        denys  Any  Injury  She       Reports  The  Pain is  Worse  On weight  Bearing         She  Reports  She  Has  Been  Soaking her  Feet and  Legs  To alleviate  The  Symptoms

## 2012-02-07 ENCOUNTER — Encounter (HOSPITAL_COMMUNITY): Payer: Self-pay | Admitting: *Deleted

## 2012-02-07 ENCOUNTER — Emergency Department (INDEPENDENT_AMBULATORY_CARE_PROVIDER_SITE_OTHER)
Admission: EM | Admit: 2012-02-07 | Discharge: 2012-02-07 | Disposition: A | Discharge status: Home / Self Care | Payer: BC Managed Care – PPO | Source: Home / Self Care | Attending: Emergency Medicine | Admitting: Emergency Medicine

## 2012-02-07 DIAGNOSIS — K047 Periapical abscess without sinus: Secondary | ICD-10-CM

## 2012-02-07 HISTORY — DX: Other chronic pain: G89.29

## 2012-02-07 HISTORY — DX: Dorsalgia, unspecified: M54.9

## 2012-02-07 MED ORDER — AMOXICILLIN-POT CLAVULANATE 875-125 MG PO TABS: 1.0000 | ORAL_TABLET | Freq: Two times a day (BID) | ORAL | Status: AC

## 2012-02-07 MED ORDER — LIDOCAINE VISCOUS 2 % MT SOLN: 10.0000 mL | OROMUCOSAL | Status: AC | PRN

## 2012-02-07 MED ORDER — CHLORHEXIDINE GLUCONATE 0.12 % MT SOLN: OROMUCOSAL | Status: AC

## 2012-02-07 NOTE — ED Provider Notes (Signed)
History     CSN: [id number]  Arrival date & time 02/07/12  1513   First MD Initiated Contact with Patient 02/07/12 1515      Chief Complaint  Patient presents with  . Facial Swelling    (Consider location/radiation/quality/duration/timing/severity/associated sxs/prior treatment) HPI Comments: Patient states that she has a known "bad tooth" in her left lateral incisor,  and she was supposed to get a root canal, but she never went back for the procedure. States that the tooth broke several days ago, she reports dental pain. She's been taking her Voltaren, tramadol Rx to her for her chronic back pain with adequate pain relief. She also started amoxicillin unknown dose, took one pill yesterday, and another pill this morning. States she woke up this afternoon with the left side of her face swollen and tender. No nausea, vomiting, fevers, visual changes, nasal congestion. No drooling, trismus, neck pain, sensation of throat swelling shut.  Patient is a 46 y.o. female presenting with tooth pain. The history is provided by the patient. No language interpreter was used.  Dental PainThe primary symptoms include mouth pain. The symptoms began 2 days ago. The symptoms are worsening. The symptoms are new. The symptoms occur constantly.  Additional symptoms include: dental sensitivity to temperature, gum tenderness and facial swelling. Additional symptoms do not include: gum swelling, purulent gums, jaw pain, drooling and ear pain. Medical issues include: periodontal disease. Medical issues do not include: smoking.    Past Medical History  Diagnosis Date  . Bronchitis   . Depression   . Osteoarthritis   . Chronic back pain     Past Surgical History  Procedure Date  . Cholecystectomy   . Cesarean section   . Tubal ligation     Family History  Problem Relation Age of Onset  . Cancer Father     History  Substance Use Topics  . Smoking status: Former Smoker    Quit date: 07/31/2007  .  Smokeless tobacco: Not on file  . Alcohol Use: No    OB History    Grav Para Term Preterm Abortions TAB SAB Ect Mult Living                  Review of Systems  HENT: Positive for facial swelling. Negative for ear pain and drooling.     Allergies  Clarithromycin and Sulfa antibiotics  Home Medications   Current Outpatient Rx  Name Route Sig Dispense Refill  . ALBUTEROL SULFATE HFA 108 (90 BASE) MCG/ACT IN AERS Inhalation Inhale 2 puffs into the lungs every 6 (six) hours as needed.      . AMOXICILLIN-POT CLAVULANATE 875-125 MG PO TABS Oral Take 1 tablet by mouth 2 (two) times daily. X 10 days 20 tablet 0  . CHLORHEXIDINE GLUCONATE 0.12 % MT SOLN  15 mL swish and spit bid 120 mL 0  . CITALOPRAM HYDROBROMIDE 10 MG PO TABS Oral Take 1 tablet (10 mg total) by mouth daily. 30 tablet 0  . DICLOFENAC SODIUM 50 MG PO TBEC Oral Take 1 tablet (50 mg total) by mouth 3 (three) times daily as needed. 60 tablet 0  . DICLOFENAC SODIUM 75 MG PO TBEC Oral Take 1 tablet (75 mg total) by mouth 2 (two) times daily. 60 tablet 0  . FEXOFENADINE HCL 60 MG PO TABS Oral Take 1 tablet (60 mg total) by mouth 2 (two) times daily. X 10 days 20 tablet 0  . LIDOCAINE VISCOUS 2 % MT SOLN Oral Take 10  mLs by mouth as needed for pain. Hold in mouth and spit. Do not swallow. 100 mL 0  . TRAMADOL HCL 50 MG PO TABS Oral Take 1 tablet (50 mg total) by mouth every 6 (six) hours as needed. Maximum dose= 8 tablets per day 30 tablet 0  . TRIAMCINOLONE ACETONIDE 0.1 % EX CREA Topical Apply topically 3 (three) times daily. 454 g 0    BP 125/78  Pulse 110  Temp(Src) 98.4 F (36.9 C) (Oral)  Resp 20  SpO2 97%  Physical Exam  Nursing note and vitals reviewed. Constitutional: She is oriented to person, place, and time. She appears well-developed and well-nourished. No distress.  HENT:  Head: Normocephalic and atraumatic. No trismus in the jaw.    Nose: Nose normal. Left sinus exhibits no maxillary sinus tenderness.    Mouth/Throat: Uvula is midline and oropharynx is clear and moist. Abnormal dentition. Dental caries present.         Tender facial swelling, see drawing. No loss of nasolabial fold. Left upper lateral incisor tooth #10 broken in half. Pulp exposed. Tooth slightly loose, tender with palpation. Gingiva tender to percussion. No purulent drainage.  Eyes: Conjunctivae and EOM are normal. Pupils are equal, round, and reactive to light.  Neck: Normal range of motion.  Cardiovascular: Normal rate.   Pulmonary/Chest: Effort normal.  Abdominal: She exhibits no distension.  Musculoskeletal: Normal range of motion.  Lymphadenopathy:    She has no cervical adenopathy.  Neurological: She is alert and oriented to person, place, and time.  Skin: Skin is warm and dry.  Psychiatric: She has a normal mood and affect. Her behavior is normal. Judgment and thought content normal.    ED Course  Procedures (including critical care time)  Labs Reviewed - No data to display No results found.   1. Dental abscess     MDM  Afebrile here. No evidence of canine space abscess at this time, but advised patient that she needs to have close followup with dentist or oral surgeon, as this could progress rapidly. Starting her on Augmentin, Peridex. She states that she has plenty of Voltaren and Tylenol at home. Controlling her pain currently. Will have her continue this as well.   Luiz Blare, MD 02/07/12 [phone]

## 2012-02-07 NOTE — ED Notes (Signed)
Pt  Reports  Swelling of  l  Side  Of  Face    For  Several  Days       She   Took  Some  amox  She  Had  On hand     -    She  Reports  It  Started  Odd  As  A  Toothache  But she has  Dental  Decay

## 2012-02-07 NOTE — Discharge Instructions (Signed)
Continue the voltaren and tramadol for pain and swelling. Cold compresses. Follow up with your dentist or one of our referrals within 48 hours. Return sooner if you do not get better on the augmentin, if you have a fever >100.4, or other concerns.

## 2012-06-03 ENCOUNTER — Emergency Department (INDEPENDENT_AMBULATORY_CARE_PROVIDER_SITE_OTHER)
Admission: EM | Admit: 2012-06-03 | Discharge: 2012-06-03 | Disposition: A | Discharge status: Home / Self Care | Payer: BC Managed Care – PPO | Source: Home / Self Care | Attending: Family Medicine | Admitting: Family Medicine

## 2012-06-03 ENCOUNTER — Encounter (HOSPITAL_COMMUNITY): Payer: Self-pay | Admitting: Emergency Medicine

## 2012-06-03 DIAGNOSIS — J069 Acute upper respiratory infection, unspecified: Secondary | ICD-10-CM

## 2012-06-03 MED ORDER — FLUTICASONE PROPIONATE 50 MCG/ACT NA SUSP: 2.0000 | Freq: Every day | NASAL | Status: DC

## 2012-06-03 NOTE — ED Provider Notes (Signed)
History     CSN: [id number]  Arrival date & time 06/03/12  1239   First MD Initiated Contact with Patient 06/03/12 1415      Chief Complaint  Patient presents with  . Cough    (Consider location/radiation/quality/duration/timing/severity/associated sxs/prior treatment) Patient is a 46 y.o. female presenting with cough. The history is provided by the patient.  Cough Associated symptoms include rhinorrhea and sore throat. Pertinent negatives include no shortness of breath and no wheezing.  Hila is a 46 y.o. female who complains of cough for 3 days.  No sore throat + cough No pleuritic pain No wheezing + nasal congestion + post-nasal drainage + sinus pain/pressure No voice changes No chest congestion No itchy/red eyes No earache No hemoptysis No SOB No chills/sweats No fever No nausea No vomiting No abdominal pain No diarrhea No skin rashes No fatigue No myalgias No headache  No ill contacts   Past Medical History  Diagnosis Date  . Bronchitis   . Depression   . Osteoarthritis   . Chronic back pain     Past Surgical History  Procedure Date  . Cholecystectomy   . Cesarean section   . Tubal ligation     Family History  Problem Relation Age of Onset  . Cancer Father     History  Substance Use Topics  . Smoking status: Former Smoker    Quit date: 07/31/2007  . Smokeless tobacco: Not on file  . Alcohol Use: No    OB History    Grav Para Term Preterm Abortions TAB SAB Ect Mult Living                  Review of Systems  Constitutional: Negative.   HENT: Positive for congestion, sore throat, rhinorrhea, sneezing, postnasal drip and sinus pressure. Negative for trouble swallowing and voice change.   Respiratory: Positive for cough. Negative for choking, shortness of breath, wheezing and stridor.   Cardiovascular: Negative.     Allergies  Clarithromycin and Sulfa antibiotics  Home Medications   Current Outpatient Rx  Name Route Sig  Dispense Refill  . CITALOPRAM HYDROBROMIDE 10 MG PO TABS Oral Take 1 tablet (10 mg total) by mouth daily. 30 tablet 0  . DICLOFENAC SODIUM 50 MG PO TBEC Oral Take 1 tablet (50 mg total) by mouth 3 (three) times daily as needed. 60 tablet 0  . TRAMADOL HCL 50 MG PO TABS Oral Take 1 tablet (50 mg total) by mouth every 6 (six) hours as needed. Maximum dose= 8 tablets per day 30 tablet 0  . ALBUTEROL SULFATE HFA 108 (90 BASE) MCG/ACT IN AERS Inhalation Inhale 2 puffs into the lungs every 6 (six) hours as needed.      Marland Kitchen DICLOFENAC SODIUM 75 MG PO TBEC Oral Take 1 tablet (75 mg total) by mouth 2 (two) times daily. 60 tablet 0  . FEXOFENADINE HCL 60 MG PO TABS Oral Take 1 tablet (60 mg total) by mouth 2 (two) times daily. X 10 days 20 tablet 0  . FLUTICASONE PROPIONATE 50 MCG/ACT NA SUSP Nasal Place 2 sprays into the nose daily. 16 g 2  . TRIAMCINOLONE ACETONIDE 0.1 % EX CREA Topical Apply topically 3 (three) times daily. 454 g 0    BP 132/7  Pulse 72  Temp 98.3 F (36.8 C) (Oral)  Resp 20  SpO2 97%  Physical Exam  Nursing note and vitals reviewed. Constitutional: She is oriented to person, place, and time. Vital signs are normal. She appears  well-developed and well-nourished. She is active and cooperative.  HENT:  Head: Normocephalic.  Right Ear: Hearing, tympanic membrane, external ear and ear canal normal.  Left Ear: Hearing, tympanic membrane, external ear and ear canal normal.  Nose: Nose normal.  Mouth/Throat: Uvula is midline, oropharynx is clear and moist and mucous membranes are normal.  Eyes: Conjunctivae normal are normal. Pupils are equal, round, and reactive to light. No scleral icterus.  Neck: Trachea normal. Neck supple.  Cardiovascular: Normal rate, regular rhythm, normal heart sounds and normal pulses.   Pulmonary/Chest: Effort normal and breath sounds normal.  Lymphadenopathy:       Head (right side): No submental, no submandibular, no tonsillar, no preauricular, no  posterior auricular and no occipital adenopathy present.       Head (left side): No submental, no submandibular, no tonsillar, no preauricular, no posterior auricular and no occipital adenopathy present.    She has no cervical adenopathy.  Neurological: She is alert and oriented to person, place, and time. No cranial nerve deficit or sensory deficit.  Skin: Skin is warm and dry.  Psychiatric: She has a normal mood and affect. Her speech is normal and behavior is normal. Judgment and thought content normal. Cognition and memory are normal.    ED Course  Procedures (including critical care time)  Labs Reviewed - No data to display No results found.   1. URI (upper respiratory infection)       MDM         Johnsie Kindred, NP 06/03/12 1432

## 2012-06-03 NOTE — ED Notes (Signed)
Pt c/o cough w/yellow sputum x2 days... It's worse at night preventing her to sleep... Also started having diarrhea this morning... Denies: fever, vomiting, nausea

## 2012-06-06 NOTE — ED Provider Notes (Signed)
Medical screening examination/treatment/procedure(s) were performed by resident physician or non-physician practitioner and as supervising physician I was immediately available for consultation/collaboration.   Barkley Bruns MD.    Linna Hoff, MD 06/06/12 [phone]

## 2012-09-26 ENCOUNTER — Other Ambulatory Visit (HOSPITAL_COMMUNITY)
Admission: RE | Admit: 2012-09-26 | Discharge: 2012-09-26 | Disposition: A | Discharge status: Home / Self Care | Payer: BC Managed Care – PPO | Source: Ambulatory Visit | Attending: Family Medicine | Admitting: Family Medicine

## 2012-09-26 ENCOUNTER — Other Ambulatory Visit: Payer: Self-pay | Admitting: Family Medicine

## 2012-09-26 DIAGNOSIS — Z01419 Encounter for gynecological examination (general) (routine) without abnormal findings: Secondary | ICD-10-CM | POA: Insufficient documentation

## 2014-03-10 ENCOUNTER — Emergency Department (INDEPENDENT_AMBULATORY_CARE_PROVIDER_SITE_OTHER)
Admission: EM | Admit: 2014-03-10 | Discharge: 2014-03-10 | Disposition: A | Discharge status: Home / Self Care | Payer: Self-pay | Source: Home / Self Care | Attending: Family Medicine | Admitting: Family Medicine

## 2014-03-10 ENCOUNTER — Emergency Department (INDEPENDENT_AMBULATORY_CARE_PROVIDER_SITE_OTHER): Payer: BC Managed Care – PPO

## 2014-03-10 ENCOUNTER — Encounter (HOSPITAL_COMMUNITY): Payer: Self-pay | Admitting: Emergency Medicine

## 2014-03-10 DIAGNOSIS — J45901 Unspecified asthma with (acute) exacerbation: Secondary | ICD-10-CM

## 2014-03-10 MED ORDER — PREDNISONE 20 MG PO TABS
60.0000 mg | ORAL_TABLET | Freq: Once | ORAL | Status: AC
  Administered 2014-03-10: 60 mg via ORAL

## 2014-03-10 MED ORDER — IPRATROPIUM BROMIDE 0.02 % IN SOLN
RESPIRATORY_TRACT | Status: AC
  Filled 2014-03-10: qty 2.5

## 2014-03-10 MED ORDER — METHYLPREDNISOLONE SODIUM SUCC 125 MG IJ SOLR
125.0000 mg | Freq: Once | INTRAMUSCULAR | Status: AC
  Administered 2014-03-10: 125 mg via INTRAMUSCULAR

## 2014-03-10 MED ORDER — ALBUTEROL SULFATE HFA 108 (90 BASE) MCG/ACT IN AERS: 2.0000 | INHALATION_SPRAY | Freq: Four times a day (QID) | RESPIRATORY_TRACT | Status: DC | PRN

## 2014-03-10 MED ORDER — ALBUTEROL SULFATE (2.5 MG/3ML) 0.083% IN NEBU
5.0000 mg | INHALATION_SOLUTION | Freq: Once | RESPIRATORY_TRACT | Status: AC
  Administered 2014-03-10: 5 mg via RESPIRATORY_TRACT

## 2014-03-10 MED ORDER — METHYLPREDNISOLONE SODIUM SUCC 125 MG IJ SOLR
INTRAMUSCULAR | Status: AC
  Filled 2014-03-10: qty 2

## 2014-03-10 MED ORDER — PREDNISONE 20 MG PO TABS
ORAL_TABLET | ORAL | Status: AC
  Filled 2014-03-10: qty 3

## 2014-03-10 MED ORDER — ALBUTEROL SULFATE (2.5 MG/3ML) 0.083% IN NEBU
INHALATION_SOLUTION | RESPIRATORY_TRACT | Status: AC
  Filled 2014-03-10: qty 6

## 2014-03-10 MED ORDER — PREDNISONE 50 MG PO TABS: 50.0000 mg | ORAL_TABLET | Freq: Every day | ORAL | Status: DC

## 2014-03-10 MED ORDER — METHYLPREDNISOLONE SODIUM SUCC 125 MG IJ SOLR: 125.0000 mg | Freq: Once | INTRAMUSCULAR | Status: DC

## 2014-03-10 MED ORDER — IPRATROPIUM BROMIDE 0.02 % IN SOLN
0.5000 mg | Freq: Once | RESPIRATORY_TRACT | Status: AC
  Administered 2014-03-10: 0.5 mg via RESPIRATORY_TRACT

## 2014-03-10 NOTE — ED Notes (Addendum)
States she got caught in rain a couple of days ago, and since then has been coughing, wheezing, minimal control of her breathing issues w her [employer] MDI. Reportedly expectorating green secretions. Able to articulate in full sentences. Scattered faint wheezing on ascultation

## 2014-03-10 NOTE — ED Notes (Signed)
Went to get pt for CXR, patient receiving breathing treatment

## 2014-03-10 NOTE — Discharge Instructions (Signed)
Thank you for coming in today. °Call or go to the emergency room if you get worse, have trouble breathing, have chest pains, or palpitations.  ° ° °Asthma, Acute Bronchospasm °Acute bronchospasm caused by asthma is also referred to as an asthma attack. Bronchospasm means your air passages become narrowed. The narrowing is caused by inflammation and tightening of the muscles in the air tubes (bronchi) in your lungs. This can make it hard to breathe or cause you to wheeze and cough. °CAUSES °Possible triggers are: °· Animal dander from the skin, hair, or feathers of animals. °· Dust mites contained in house dust. °· Cockroaches. °· Pollen from trees or grass. °· Mold. °· Cigarette or tobacco smoke. °· Air pollutants such as dust, household cleaners, hair sprays, aerosol sprays, paint fumes, strong chemicals, or strong odors. °· Cold air or weather changes. Cold air may trigger inflammation. Winds increase molds and pollens in the air. °· Strong emotions such as crying or laughing hard. °· Stress. °· Certain medicines such as aspirin or beta-blockers. °· Sulfites in foods and drinks, such as dried fruits and wine. °· Infections or inflammatory conditions, such as a flu, cold, or inflammation of the nasal membranes (rhinitis). °· Gastroesophageal reflux disease (GERD). GERD is a condition where stomach acid backs up into your esophagus. °· Exercise or strenuous activity. °SIGNS AND SYMPTOMS  °· Wheezing. °· Excessive coughing, particularly at night. °· Chest tightness. °· Shortness of breath. °DIAGNOSIS  °Your health care provider will ask you about your medical history and perform a physical exam. A chest X-ray or blood testing may be performed to look for other causes of your symptoms or other conditions that may have triggered your asthma attack.  °TREATMENT  °Treatment is aimed at reducing inflammation and opening up the airways in your lungs.  Most asthma attacks are treated with inhaled medicines. These include  quick relief or rescue medicines (such as bronchodilators) and controller medicines (such as inhaled corticosteroids). These medicines are sometimes given through an inhaler or a nebulizer. Systemic steroid medicine taken by mouth or given through an IV tube also can be used to reduce the inflammation when an attack is moderate or severe. Antibiotic medicines are only used if a bacterial infection is present.  °HOME CARE INSTRUCTIONS  °· Rest. °· Drink plenty of liquids. This helps the mucus to remain thin and be easily coughed up. Only use caffeine in moderation and do not use alcohol until you have recovered from your illness. °· Do not smoke. Avoid being exposed to secondhand smoke. °· You play a critical role in keeping yourself in good health. Avoid exposure to things that cause you to wheeze or to have breathing problems. °· Keep your medicines up-to-date and available. Carefully follow your health care provider's treatment plan. °· Take your medicine exactly as prescribed. °· When pollen or pollution is bad, keep windows closed and use an air conditioner or go to places with air conditioning. °· Asthma requires careful medical care. See your health care provider for a follow-up as advised. If you are more than [redacted] weeks pregnant and you were prescribed any new medicines, let your obstetrician know about the visit and how you are doing. Follow up with your health care provider as directed. °· After you have recovered from your asthma attack, make an appointment with your outpatient doctor to talk about ways to reduce the likelihood of future attacks. If you do not have a doctor who manages your asthma, make an appointment with a   primary care doctor to discuss your asthma. °SEEK IMMEDIATE MEDICAL CARE IF:  °· You are getting worse. °· You have trouble breathing. If severe, call your local emergency services (911 in the [place] °· You develop chest pain or discomfort. °· You are vomiting. °· You are not able to  keep fluids down. °· You are coughing up yellow, green, brown, or bloody sputum. °· You have a fever and your symptoms suddenly get worse. °· You have trouble swallowing. °MAKE SURE YOU:  °· Understand these instructions. °· Will watch your condition. °· Will get help right away if you are not doing well or get worse. °Document Released: 12/12/2006 Document Revised: 09/01/2013 Document Reviewed: 03/04/2013 °[employer]® Patient Information ©2015 [employer]. This information is not intended to replace advice given to you by your health care provider. Make sure you discuss any questions you have with your health care provider. ° °

## 2014-03-10 NOTE — ED Provider Notes (Signed)
Amanda GrapesKristina M Larson is a 48 y.o. female who presents to Urgent Care today for wheezing shortness of breath and cough. Symptoms present for days. No fevers chills nausea vomiting or diarrhea. Patient has tried albuterol multiple times which helps only a little.   Past Medical History  Diagnosis Date  . Bronchitis   . Depression   . Osteoarthritis   . Chronic back pain    History  Substance Use Topics  . Smoking status: Former Smoker    Quit date: 07/31/2007  . Smokeless tobacco: Not on file  . Alcohol Use: No   ROS as above Medications: No current facility-administered medications for this encounter.   Current Outpatient Prescriptions  Medication Sig Dispense Refill  . albuterol (PROVENTIL HFA;VENTOLIN HFA) 108 (90 BASE) MCG/ACT inhaler Inhale 2 puffs into the lungs every 6 (six) hours as needed.  1 Inhaler  1  . citalopram (CELEXA) 10 MG tablet Take 1 tablet (10 mg total) by mouth daily.  30 tablet  0  . fexofenadine (ALLEGRA) 60 MG tablet Take 1 tablet (60 mg total) by mouth 2 (two) times daily. X 10 days  20 tablet  0  . fluticasone (FLONASE) 50 MCG/ACT nasal spray Place 2 sprays into the nose daily.  16 g  2  . predniSONE (DELTASONE) 50 MG tablet Take 1 tablet (50 mg total) by mouth daily.  5 tablet  0  . traMADol (ULTRAM) 50 MG tablet Take 1 tablet (50 mg total) by mouth every 6 (six) hours as needed. Maximum dose= 8 tablets per day  30 tablet  0    Exam:  BP 137/89  Pulse 101  Temp(Src) 98.4 F (36.9 C) (Oral)  Resp 16  SpO2 94%  LMP 01/17/2014 Gen: Well NAD morbidly obese HEENT: EOMI,  MMM Lungs: Increased work of breathing with prolonged expiratory phase and wheezing. Heart: RRR no MRG Abd: NABS, Soft. NT, ND Exts: Brisk capillary refill, warm and well perfused.   Patient was given a duoneb treatment and improved. She continues to have mild wheezing but had improved WOB.   No results found for this or any previous visit (from the past 24 hour(s)). Dg Chest 2  View  03/10/2014   CLINICAL DATA:  48 year old female with shortness of breath cough and wheeze. History of bronchitis. Possible asthma. Initial encounter.  EXAM: CHEST  2 VIEW  COMPARISON:  06/21/2011 and earlier.  FINDINGS: Large body habitus. Somewhat low but larger lung volumes than in 2012. Normal cardiac size and mediastinal contours. Visualized tracheal air column is within normal limits. No pneumothorax, pulmonary edema, pleural effusion or confluent pulmonary opacity. No acute osseous abnormality identified.  IMPRESSION: No acute cardiopulmonary abnormality.   Electronically Signed   By: Augusto GambleLee  Hall M.D.   On: 03/10/2014 17:14    Assessment and Plan: 48 y.o. female with asthma exacerbation. Plan to treat with prednisone and albuterol.  Discussed warning signs or symptoms. Please see discharge instructions. Patient expresses understanding.    Rodolph BongEvan S Solace Wendorff, MD 03/10/14 [phone]

## 2014-07-27 ENCOUNTER — Emergency Department (HOSPITAL_COMMUNITY)
Admission: EM | Admit: 2014-07-27 | Discharge: 2014-07-27 | Disposition: A | Discharge status: Home / Self Care | Payer: Self-pay | Attending: Emergency Medicine | Admitting: Emergency Medicine

## 2014-07-27 ENCOUNTER — Encounter (HOSPITAL_COMMUNITY): Payer: Self-pay | Admitting: Emergency Medicine

## 2014-07-27 DIAGNOSIS — Z7951 Long term (current) use of inhaled steroids: Secondary | ICD-10-CM | POA: Insufficient documentation

## 2014-07-27 DIAGNOSIS — Z791 Long term (current) use of non-steroidal anti-inflammatories (NSAID): Secondary | ICD-10-CM | POA: Insufficient documentation

## 2014-07-27 DIAGNOSIS — G8929 Other chronic pain: Secondary | ICD-10-CM | POA: Insufficient documentation

## 2014-07-27 DIAGNOSIS — M1990 Unspecified osteoarthritis, unspecified site: Secondary | ICD-10-CM | POA: Insufficient documentation

## 2014-07-27 DIAGNOSIS — M25571 Pain in right ankle and joints of right foot: Secondary | ICD-10-CM | POA: Insufficient documentation

## 2014-07-27 DIAGNOSIS — R269 Unspecified abnormalities of gait and mobility: Secondary | ICD-10-CM | POA: Insufficient documentation

## 2014-07-27 DIAGNOSIS — Z87891 Personal history of nicotine dependence: Secondary | ICD-10-CM | POA: Insufficient documentation

## 2014-07-27 DIAGNOSIS — Z79899 Other long term (current) drug therapy: Secondary | ICD-10-CM | POA: Insufficient documentation

## 2014-07-27 DIAGNOSIS — Z8709 Personal history of other diseases of the respiratory system: Secondary | ICD-10-CM | POA: Insufficient documentation

## 2014-07-27 DIAGNOSIS — F329 Major depressive disorder, single episode, unspecified: Secondary | ICD-10-CM | POA: Insufficient documentation

## 2014-07-27 MED ORDER — DICLOFENAC SODIUM 25 MG PO TBEC
25.0000 mg | DELAYED_RELEASE_TABLET | ORAL | Status: AC
  Administered 2014-07-27: 25 mg via ORAL
  Filled 2014-07-27: qty 1

## 2014-07-27 MED ORDER — DICLOFENAC SODIUM 75 MG PO TBEC: 75.0000 mg | DELAYED_RELEASE_TABLET | Freq: Two times a day (BID) | ORAL | Status: DC

## 2014-07-27 MED ORDER — TRAMADOL HCL 50 MG PO TABS
50.0000 mg | ORAL_TABLET | ORAL | Status: AC
  Administered 2014-07-27: 50 mg via ORAL
  Filled 2014-07-27: qty 1

## 2014-07-27 MED ORDER — TRAMADOL HCL 50 MG PO TABS: 50.0000 mg | ORAL_TABLET | Freq: Four times a day (QID) | ORAL | Status: DC | PRN

## 2014-07-27 MED ORDER — CITALOPRAM HYDROBROMIDE 10 MG PO TABS: 10.0000 mg | ORAL_TABLET | Freq: Every day | ORAL | Status: DC

## 2014-07-27 NOTE — ED Notes (Signed)
Pt. Stated, Im having severe pain in my right foot and ankle, it started around 4 today.  i have arthritis and Im out of my medicine.

## 2014-07-27 NOTE — Discharge Instructions (Signed)
Please follow the directions provided.  Be sure to establish care with one of the resources provided for follow-up.  Use the medicines as directed.  Don't hesitate to return for any new, worsening or concerning symptoms.     SEEK IMMEDIATE MEDICAL CARE IF:  You have a fever.  You develop severe joint pain, swelling or redness.  Many joints are involved and become painful and swollen.  There is severe back pain and/or leg weakness.  You have loss of bowel or bladder control.    Emergency Department Resource Guide 1) Find a Doctor and Pay Out of Pocket Although you won't have to find out who is covered by your insurance plan, it is a good idea to ask around and get recommendations. You will then need to call the office and see if the doctor you have chosen will accept you as a new patient and what types of options they offer for patients who are self-pay. Some doctors offer discounts or will set up payment plans for their patients who do not have insurance, but you will need to ask so you aren't surprised when you get to your appointment.  2) Contact Your Local Health Department Not all health departments have doctors that can see patients for sick visits, but many do, so it is worth a call to see if yours does. If you don't know where your local health department is, you can check in your phone book. The CDC also has a tool to help you locate your state's health department, and many state websites also have listings of all of their local health departments.  3) Find a Walk-in Clinic If your illness is not likely to be very severe or complicated, you may want to try a walk in clinic. These are popping up all over the country in pharmacies, drugstores, and shopping centers. They're usually staffed by nurse practitioners or physician assistants that have been trained to treat common illnesses and complaints. They're usually fairly quick and inexpensive. However, if you have serious medical issues or  chronic medical problems, these are probably not your best option.  No Primary Care Doctor: - Call Health Connect at  [phone] - they can help you locate a primary care doctor that  accepts your insurance, provides certain services, etc. - Physician Referral Service- [phone]  Chronic Pain Problems: Organization         Address  Phone   Notes  [facility]  [phone] Patients need to be referred by their primary care doctor.   Medication Assistance: Organization         Address  Phone   Notes  [facility] Medication [facility] [address] [place], [place] [address] [phone] --Must be a resident of [facility] -- Must have NO insurance coverage whatsoever (no Medicaid/ Medicare, etc.) -- The pt. MUST have a primary care doctor that directs their care regularly and follows them in the community   MedAssist  [phone]   [employer]  [phone]    Agencies that provide inexpensive medical care: Organization         Address  Phone   Notes  [facility]  [phone]   [facility]    [phone]   [facility] [address] [place], [place] [address] [phone]   [facility] [address], [place] [phone]   Planned Parenthood    (  [phone]   [facility]    [phone]   [facility]  [address], [place] Phone:  [phone], Fax:  [phone] Hours of Operation:  9 am - 6 pm, M-F.  Also accepts Medicaid/Medicare and self-pay.  [facility] for [place], [address], [place] Phone: [phone], Fax: [phone]. Hours of Operation:  8:30 am - 5:30 pm, M-F.  Also accepts Medicaid and self-pay.  [facility] [place] [address], [place] Phone: [phone]   [place], [place], [place]  [phone], Ext. [phone] Mondays & Thursdays: 7-9 AM.  First 15 patients are seen on a first come, first serve basis.    [place] Providers:  Organization         Address  Phone   Notes  [facility] [address], [place] [phone] Also accepts self-pay patients.  [facility] [address], [place]  [phone]   [place], [address], [place] [phone]   [facility] [address] [place] [phone]   Lucianne Lei [address], [place]   [phone] Only accepts [place] Access [place] patients after they have their name applied to their card.   Self-Pay (no insurance) in [facility]:  Organization         Address  Phone   Notes  Sickle Cell Patients, [facility] [place] [phone]   [facility] [place] [phone]   [facility] [place]  [place], [place], [place] [phone]   [facility]/Dr. Osei-Bonsu  [address] [place] or [address], [place] [phone] Phone number for both [place] and [place] locations is the same.  [facility] [address] [place] [phone]   [facility] [address] [place] or [address] [phone]   [facility] [address] [place] [phone], phone; [phone], fax Sees patients 1st and 3rd Saturday of every month.  Must not qualify for public or private insurance (i.e. Medicaid, Medicare, [place] Health Choice, Veterans' Benefits)  Household income should be no more than 200% of the poverty level The clinic cannot treat you if you are pregnant or think you are pregnant  Sexually transmitted  diseases are not treated at the clinic.    Dental Care: Organization         Address  Phone  Notes  [facility] [place] [phone] Accepts children up to age 73 who are enrolled in [place] or [place]; pregnant women with a Medicaid card; and children who have applied for Medicaid or [place] Health Choice, but were declined, whose parents can pay a reduced fee at time of service.  [facility] Department of [facility]  [address], [place] [phone] Accepts children up to age 14 who are enrolled in [place] or [place]; pregnant women with a Medicaid card; and children who have applied for Medicaid or [place], but were declined, whose parents can pay a  reduced fee at time of service.  [place] Adult [facility] Access PROGRAM  [place] [phone] Patients are seen by appointment only. Walk-ins are not accepted. [place] will see patients 42 years of age and older. Monday - Tuesday (8am-5pm) Most Wednesdays (8:30-5pm) $30 per visit, cash only  [facility] Adult Dental Access PROGRAM  [address], [facility] [phone] Patients are seen by appointment only. Walk-ins are not accepted. [place] will see patients 71 years of age and older. One Wednesday Evening (Monthly: Volunteer Based).  $30 per visit, cash only  [place]  [phone] for adults; Children under age 66, call Graduate Pediatric Dentistry at [phone]. Children aged 12-14, please call [phone] to request a pediatric application.  Dental services are provided in all areas of dental care including fillings, crowns and bridges, complete and partial dentures, implants, gum treatment, root canals, and extractions. Preventive care is also provided. Treatment is provided to both adults and children. Patients are selected via a  lottery and there is often a waiting list.   [facility] [address], [place]  [phone] [email or web address]   [facility] [address] [place], [place] [phone], Ext. [phone] Second and Fourth Thursday of each month, opens at 6:30 AM; Clinic ends at 9 AM.  Patients are seen on a first-come first-served basis, and a limited number are seen during each clinic.   [facility]  [address] [place], [place] [phone]   Eligibility Requirements You must have lived in [place], [place], or [place] counties for at least the last three months.   You cannot be eligible for state or federal sponsored [employer], including [employer], [place], or [employer].   You generally cannot be eligible for healthcare insurance through your employer.    How to apply: Eligibility screenings are held every Tuesday and Wednesday afternoon from 1:00 pm until 4:00 pm. You do not need an appointment for the interview!  [facility] [address] [place], [place]   [place]  [place] Department  [place]  [phone]    Behavioral Health Resources in the Community: Intensive Outpatient Programs Organization         Address  Phone  Notes  [place] [address], [place], [place] [phone]   [facility] [address] [place], [place]   ADS: Alcohol & Drug Svcs [address] [place], [place]   [place] [address],  [place], [place] or [phone]   Substance Abuse Resources Organization         Address  Phone  Notes  Alcohol and Drug Services  [phone]   [place]  [phone]   The [place]   Chinita Pester  [phone]   Residential &  Outpatient Substance Abuse Program  [phone]   Psychological Services Organization         Address  Phone  Notes  [facility] [place]  [place]  [phone]   [place] [address] [place] or [phone]    Mobile Crisis Teams Organization         Address  Phone  Notes  [facility], Mobile Crisis Care Unit  [phone]  [facility]ive Psychotherapeutic Services  [address] [place], [place]   [facility] [address], [place] [phone]    Self-Help/Support Groups Organization         Address  Phone             Notes  [facility]ental [facility] of [place] - variety of support groups  [place] Call for more information  Narcotics Anonymous (NA), [facility]aring [facility] [address], [employer] [place]  2 meetings at this location   Special educational needs teacher         Address  Phone  Notes  [facility] Residential Treatment [place],    [place]  [phone]   [facility]  [address] [place] [address], [place], [place]   [place], [place] [phone] Admissions: 8am-3pm M-F  Incentives Substance [place] [address]    [address], [place] [phone]   [facility] [address] [place], [place], [place]   [facility] [address]  [place], [place]   [facility] Programs - Intensive Outpatient [address], [place], [place]   [facility] ([place].,  [place], [place] [phone] or [phone]   [facility] Services (RTS) [address], [place], [place] Accepts Medicaid  [facility] [address],  [place] [phone] Substance Abuse/Addiction Treatment   [facility] Organization          Address  Phone  Notes  [facility]  [phone]   Domenic Schwab, PhD [address] [place], [place]   [phone] or [phone]   [place] [facility] [place], [place] [phone]   [facility] [address] [place], [place] [phone] Insurance/Medicaid/sponsorship through [facility] and Families [address] [place]                                    [place], [place] [phone] [place] [address], [place] [phone]    Dr. Adele Schilder  [phone]   [facility] Dept. 1) [address] [place] 2) [facility] 3)  [place] [address], [place] [phone]  [phone]   [place] [phone] or [phone] (After Hours)

## 2014-07-27 NOTE — ED Provider Notes (Signed)
CSN: [id number]     Arrival date & time 07/27/14  1827 History   First MD Initiated Contact with Patient 07/27/14 1958     Chief Complaint  Patient presents with  . Ankle Pain  . Foot Pain   (Consider location/radiation/quality/duration/timing/severity/associated sxs/prior Treatment) HPI Amanda Larson is a 48 yo female presenting with right ankle pain onset today.  She reports stepping out of the car and noticing the pain.  She does report of a history of arthritis and being out of her medicines due to a lapse in insurance coverage. She denies any falls or known injury.  She denies any fever, new swelling or redness to the ankle.  Past Medical History  Diagnosis Date  . Bronchitis   . Depression   . Osteoarthritis   . Chronic back pain    Past Surgical History  Procedure Laterality Date  . Cholecystectomy    . Cesarean section    . Tubal ligation     Family History  Problem Relation Age of Onset  . Cancer Father    History  Substance Use Topics  . Smoking status: Former Smoker    Quit date: 07/31/2007  . Smokeless tobacco: Not on file  . Alcohol Use: No   OB History    No data available     Review of Systems  Constitutional: Negative for fever.  Respiratory: Negative for shortness of breath.   Cardiovascular: Negative for chest pain and leg swelling.  Musculoskeletal: Positive for arthralgias and gait problem. Negative for myalgias.  Skin: Negative for rash.  Neurological: Negative for weakness and numbness.      Allergies  Clarithromycin and Sulfa antibiotics  Home Medications   Prior to Admission medications   Medication Sig Start Date End Date Taking? Authorizing Provider  albuterol (PROVENTIL HFA;VENTOLIN HFA) 108 (90 BASE) MCG/ACT inhaler Inhale 2 puffs into the lungs every 6 (six) hours as needed. 03/10/14  Yes Rodolph BongEvan S Corey, MD  citalopram (CELEXA) 10 MG tablet Take 1 tablet (10 mg total) by mouth daily. 10/10/11  Yes Delanna Noticeonald Laney, MD  diclofenac  (VOLTAREN) 75 MG EC tablet Take 75 mg by mouth 2 (two) times daily.   Yes Historical Provider, MD  traMADol (ULTRAM) 50 MG tablet Take 1 tablet (50 mg total) by mouth every 6 (six) hours as needed. Maximum dose= 8 tablets per day 10/10/11  Yes Delanna Noticeonald Laney, MD  fexofenadine (ALLEGRA) 60 MG tablet Take 1 tablet (60 mg total) by mouth 2 (two) times daily. X 10 days 07/31/11 07/30/12  Jimmie MollyPaolo Coll, MD  fluticasone [facility](FLONASE) 50 MCG/ACT nasal spray Place 2 sprays into the nose daily. Patient not taking: Reported on 07/27/2014 06/03/12   Johnsie Kindredarmen L Chatten, NP  predniSONE (DELTASONE) 50 MG tablet Take 1 tablet (50 mg total) by mouth daily. Patient not taking: Reported on 07/27/2014 03/10/14   Rodolph BongEvan S Corey, MD   BP 127/100 mmHg  Pulse 83  Temp(Src) 97.8 F (36.6 C) (Oral)  Resp 18  SpO2 97%  LMP 01/17/2014 Physical Exam  Constitutional: She is oriented to person, place, and time. She appears well-developed and well-nourished. No distress.  HENT:  Head: Normocephalic and atraumatic.  Eyes: Conjunctivae are normal. No scleral icterus.  Neck: Neck supple.  Cardiovascular: Normal rate, regular rhythm and intact distal pulses.   Pulmonary/Chest: Effort normal and breath sounds normal. No respiratory distress.  Abdominal: Soft. There is no tenderness.  Musculoskeletal: She exhibits tenderness. She exhibits no edema.  Right ankle: She exhibits no swelling and no deformity. Tenderness.  Neurological: She is alert and oriented to person, place, and time. She has normal strength. No cranial nerve deficit or sensory deficit. Coordination normal. GCS eye subscore is 4. GCS verbal subscore is 5. GCS motor subscore is 6.  Skin: Skin is warm and dry. No rash noted. She is not diaphoretic.  Nursing note and vitals reviewed.   ED Course  Procedures (including critical care time) Labs Review Labs Reviewed - No data to display  Imaging Review No results found.   EKG Interpretation None      MDM    Final diagnoses:  Ankle pain, right   48 yo female with ankle pain without injury.  Her right ankle is symmetrical to her left ankle and there is no redness, warmth or deformity.  No indication for imaging.  Pt has been off of her arthritis meds because of insurance coverage.  Will treat with home doses of diclofenac and tramadol and provide limited prescriptions and resources to establish care with PCP and orthopedics. Pain managed in ED. Conservative therapy recommended and discussed. Patient will be dc home & is agreeable with above plan. Return precautions provided.   Filed Vitals:   07/27/14 1841 07/27/14 2115  BP: 127/100 122/60  Pulse: 83 63  Temp: 97.8 F (36.6 C) 98.5 F (36.9 C)  TempSrc: Oral Oral  Resp: 18 20  SpO2: 97% 100%   Meds given in ED:  Medications  diclofenac (VOLTAREN) EC tablet 25 mg (25 mg Oral Given 07/27/14 2105)  traMADol (ULTRAM) tablet 50 mg (50 mg Oral Given 07/27/14 2105)    Discharge Medication List as of 07/27/2014  9:16 PM         Harle BattiestElizabeth Lurine Imel, NP 07/29/14 2230  Gilda Creasehristopher J. Pollina, MD 08/02/14 1040

## 2014-07-30 ENCOUNTER — Encounter (HOSPITAL_COMMUNITY): Payer: Self-pay | Admitting: *Deleted

## 2014-07-30 ENCOUNTER — Emergency Department (INDEPENDENT_AMBULATORY_CARE_PROVIDER_SITE_OTHER)
Admission: EM | Admit: 2014-07-30 | Discharge: 2014-07-30 | Disposition: A | Discharge status: Home / Self Care | Payer: Self-pay | Source: Home / Self Care | Attending: Emergency Medicine | Admitting: Emergency Medicine

## 2014-07-30 DIAGNOSIS — L409 Psoriasis, unspecified: Secondary | ICD-10-CM

## 2014-07-30 DIAGNOSIS — G8929 Other chronic pain: Secondary | ICD-10-CM

## 2014-07-30 DIAGNOSIS — M25571 Pain in right ankle and joints of right foot: Secondary | ICD-10-CM

## 2014-07-30 MED ORDER — PREDNISONE 10 MG PO TABS: ORAL_TABLET | ORAL | Status: DC

## 2014-07-30 MED ORDER — TRIAMCINOLONE ACETONIDE 0.5 % EX CREA: 1.0000 "application " | TOPICAL_CREAM | Freq: Two times a day (BID) | CUTANEOUS | Status: DC

## 2014-07-30 NOTE — ED Provider Notes (Signed)
CSN: [id number]     Arrival date & time 07/30/14  1101 History   First MD Initiated Contact with Patient 07/30/14 1157     Chief Complaint  Patient presents with  . Rash   (Consider location/radiation/quality/duration/timing/severity/associated sxs/prior Treatment) HPI  She is a 48 year old woman here for evaluation of right ankle pain and rash.  She was seen for the right ankle pain in the ER 3 days ago. She has known arthritis and she did not of medication for several days. The ER restarted her on diclofenac and tramadol. Her pain is improved, but still present. She is worried about her psoriasis causing the arthritis.  She also describes worsening of her psoriatic rash. She has multiple red plaques on her legs. This is been worse since she has stopped going to the tanning bed.  Past Medical History  Diagnosis Date  . Bronchitis   . Depression   . Osteoarthritis   . Chronic back pain    Past Surgical History  Procedure Laterality Date  . Cholecystectomy    . Cesarean section    . Tubal ligation     Family History  Problem Relation Age of Onset  . Cancer Father    History  Substance Use Topics  . Smoking status: Former Smoker    Quit date: 07/31/2007  . Smokeless tobacco: Not on file  . Alcohol Use: No   OB History    No data available     Review of Systems  Musculoskeletal:       Right ankle pain  Skin: Positive for rash.    Allergies  Clarithromycin and Sulfa antibiotics  Home Medications   Prior to Admission medications   Medication Sig Start Date End Date Taking? Authorizing Provider  albuterol (PROVENTIL HFA;VENTOLIN HFA) 108 (90 BASE) MCG/ACT inhaler Inhale 2 puffs into the lungs every 6 (six) hours as needed. 03/10/14   Rodolph BongEvan S Corey, MD  citalopram (CELEXA) 10 MG tablet Take 1 tablet (10 mg total) by mouth daily. 07/27/14   Harle BattiestElizabeth Tysinger, NP  diclofenac (VOLTAREN) 75 MG EC tablet Take 1 tablet (75 mg total) by mouth 2 (two) times daily. 07/27/14    Harle BattiestElizabeth Tysinger, NP  fexofenadine (ALLEGRA) 60 MG tablet Take 1 tablet (60 mg total) by mouth 2 (two) times daily. X 10 days 07/31/11 07/30/12  Jimmie MollyPaolo Coll, MD  fluticasone [facility](FLONASE) 50 MCG/ACT nasal spray Place 2 sprays into the nose daily. Patient not taking: Reported on 07/27/2014 06/03/12   Johnsie Kindredarmen L Chatten, NP  predniSONE (DELTASONE) 10 MG tablet Take 3 tablets for 5 days, then 2 tablets for 3 days, then 1 tablet for 3 days. 07/30/14   Charm RingsErin J Tyshell Ramberg, MD  traMADol (ULTRAM) 50 MG tablet Take 1 tablet (50 mg total) by mouth every 6 (six) hours as needed. Maximum dose= 8 tablets per day 07/27/14   Harle BattiestElizabeth Tysinger, NP  triamcinolone cream (KENALOG) 0.5 % Apply 1 application topically 2 (two) times daily. 07/30/14   Charm RingsErin J Quaron Delacruz, MD   BP 134/70 mmHg  Pulse 71  Temp(Src) 97.9 F (36.6 C) (Oral)  Resp 20  SpO2 100%  LMP 01/17/2014 Physical Exam  Constitutional: She is oriented to person, place, and time. She appears well-developed and well-nourished. No distress.  Cardiovascular: Normal rate.   Pulmonary/Chest: Effort normal.  Musculoskeletal:  Right ankle: no erythema or edema.  No point tenderness.  Full range of motion.  No laxity.  Neurological: She is alert and oriented to person, place, and time.  Skin:  Raised erythematous plaque on right elbow.  Multiple 0.5cm erythematous plaques with scale over bilateral lower extremities.    ED Course  Procedures (including critical care time) Labs Review Labs Reviewed - No data to display  Imaging Review No results found.   MDM   1. Psoriasis   2. Chronic ankle pain, right    Will treat ankle arthritis as well as psoriasis with a prednisone taper. Also provided triamcinolone 0.5% cream as is the strongest topical steroid available on the $4 list. Follow-up as needed.    Charm RingsErin J Tyreek Clabo, MD 07/30/14 1240

## 2014-07-30 NOTE — ED Notes (Signed)
Pt  Has   Two complaints  Today  She  Reports  Is here  For  A  followup of  Her  r  Ankle she  Reports  Seen er   sev days  Ago for  Ankle  Pain    Still has  Symptoms   Pt  Also reports  A  Dry rash on  Arms     History  Of  Psoriasis      States  Rash  Getting  Worse

## 2014-07-30 NOTE — Discharge Instructions (Signed)
Take the prednisone as on the prescription to help with ankle pain and rash. Use the Triamcinolone 0.5% cream twice a day (this is the strongest cream available on the $4 list). You may benefit from seeing a rheumatologist or dermatologist for additional treatment of the psoriasis. Follow up as needed.

## 2014-08-23 ENCOUNTER — Emergency Department (INDEPENDENT_AMBULATORY_CARE_PROVIDER_SITE_OTHER)
Admission: EM | Admit: 2014-08-23 | Discharge: 2014-08-23 | Disposition: A | Discharge status: Home / Self Care | Payer: Self-pay | Source: Home / Self Care | Attending: Emergency Medicine | Admitting: Emergency Medicine

## 2014-08-23 ENCOUNTER — Ambulatory Visit (HOSPITAL_COMMUNITY): Payer: Self-pay | Attending: Emergency Medicine

## 2014-08-23 ENCOUNTER — Encounter (HOSPITAL_COMMUNITY): Payer: Self-pay | Admitting: Emergency Medicine

## 2014-08-23 DIAGNOSIS — R042 Hemoptysis: Secondary | ICD-10-CM

## 2014-08-23 DIAGNOSIS — R69 Illness, unspecified: Principal | ICD-10-CM

## 2014-08-23 DIAGNOSIS — J111 Influenza due to unidentified influenza virus with other respiratory manifestations: Secondary | ICD-10-CM

## 2014-08-23 DIAGNOSIS — R509 Fever, unspecified: Secondary | ICD-10-CM | POA: Insufficient documentation

## 2014-08-23 LAB — POCT RAPID STREP A: STREPTOCOCCUS, GROUP A SCREEN (DIRECT): NEGATIVE

## 2014-08-23 MED ORDER — BENZONATATE 200 MG PO CAPS: 200.0000 mg | ORAL_CAPSULE | Freq: Three times a day (TID) | ORAL | Status: DC | PRN

## 2014-08-23 MED ORDER — IPRATROPIUM BROMIDE 0.06 % NA SOLN: 2.0000 | Freq: Four times a day (QID) | NASAL | Status: DC

## 2014-08-23 MED ORDER — TRAMADOL HCL 50 MG PO TABS: 100.0000 mg | ORAL_TABLET | Freq: Three times a day (TID) | ORAL | Status: DC | PRN

## 2014-08-23 NOTE — Discharge Instructions (Signed)
Most upper respiratory infections are caused by viruses and do not require antibiotics.  We try to save the antibiotics for when we really need them to prevent bacteria from developing resistance to them.  Here are a few hints about things that can be done at home to help get over an upper respiratory infection quicker: ° °Get extra sleep and extra fluids.  Get 7 to 9 hours of sleep per night and 6 to 8 glasses of water a day.  Getting extra sleep keeps the immune system from getting run down.  Most people with an upper respiratory infection are a little dehydrated.  The extra fluids also keep the secretions liquified and easier to deal with.  Also, get extra vitamin C.  4000 mg per day is the recommended dose. °For the aches, headache, and fever, acetaminophen or ibuprofen are helpful.  These can be alternated every 4 hours.  People with liver disease should avoid large amounts of acetaminophen, and people with ulcer disease, gastroesophageal reflux, gastritis, congestive heart failure, chronic kidney disease, coronary artery disease and the elderly should avoid ibuprofen. °For nasal congestion try Mucinex-D, or if you're having lots of sneezing or clear nasal drainage use Zyrtec-D. People with high blood pressure can take these if their blood pressure is controlled, if not, it's best to avoid the forms with a "D" (decongestants).  You can use the plain Mucinex, Allegra, Claritin, or Zyrtec even if your blood pressure is not controlled.   °A Saline nasal spray such as Ocean Spray can also help.  You can add a decongestant sprays such as Afrin, but you should not use the decongestant sprays for more than 3 or 4 days since they can be habituating.  Breathe Rite nasal strips can also offer a non-drug alternative treatment to nasal congestion, especially at night. °For people with symptoms of sinusitis, sleeping with your head elevated can be helpful.  For sinus pain, moist, hot compresses to the face may provide some  relief.  Many people find that inhaling steam as in a shower or from a pot of steaming water can help. °For any viral infection, zinc containing lozenges such as Cold-Eze or Zicam are helpful.  Zinc helps to fight viral infection.  Hot salt water gargles (8 oz of hot water, 1/2 tsp of table salt, and a pinch of baking soda) can give relief as well as hot beverages such as hot tea.  Sucrets extra strength lozenges will help the sore throat.  °For the cough, take Delsym 2 tsp every 12 hours.  It has also been found recently that Aleve can help control a cough.  The dose is 1 to 2 tablets twice daily with food.  This can be combined with Delsym. (Note, if you are taking ibuprofen, you should not take Aleve as well--take one or the other.) °A cool mist vaporizer will help keep your mucous membranes from drying out.  ° °It's important when you have an upper respiratory infection not to pass the infection to others.  This involves being very careful about the following: ° °Frequent hand washing or use of hand sanitizer, especially after coughing, sneezing, blowing your nose or touching your face, nose or eyes. °Do not shake hands or touch anyone and try to avoid touching surfaces that other people use such as doorknobs, shopping carts, telephones and computer keyboards. °Use tissues and dispose of them properly in a garbage can or ziplock bag. °Cough into your sleeve. °Do not let others eat or   drink after you. ° °It's also important to recognize the signs of serious illness and get evaluated if they occur: °Any respiratory infection that lasts more than 7 to 10 days.  Yellow nasal drainage and sputum are not reliable indicators of a bacterial infection, but if they last for more than 1 week, see your doctor. °Fever and sore throat can indicate strep. °Fever and cough can indicate influenza or pneumonia. °Any kind of severe symptom such as difficulty breathing, intractable vomiting, or severe pain should prompt you to see  a doctor as soon as possible. ° ° °Your body's immune system is really the thing that will get rid of this infection.  Your immune system is comprised of 2 types of specialized cells called T cells and B cells.  T cells coordinate the array of cells in your body that engulf invading bacteria or viruses while B cells orchestrate the production of antibodies that neutralize infection.  Anything we do or any medications we give you, will just strengthen your immune system or help it clear up the infection quicker.  Here are a few helpful hints to improve your immune system to help overcome this illness or to prevent future infections: °· A few vitamins can improve the health of your immune system.  That's why your diet should include plenty of fruits, vegetables, fish, nuts, and whole grains. °· Vitamin A and bet-carotene can increase the cells that fight infections (T cells and B cells).  Vitamin A is abundant in dark greens and orange vegetables such as spinach, greens, sweet potatoes, and carrots. °· Vitamin B6 contributes to the maturation of white blood cells, the cells that fight disease.  Foods with vitamin B6 include cold cereal and bananas. °· Vitamin C is credited with preventing colds because it increases white blood cells and also prevents cellular damage.  Citrus fruits, peaches and green and red bell peppers are all hight in vitamin C. °· Vitamin E is an anti-oxidant that encourages the production of natural killer cells which reject foreign invaders and B cells that produce antibodies.  Foods high in vitamin E include wheat germ, nuts and seeds. °· Foods high in omega-3 fatty acids found in foods like salmon, tuna and mackerel boost your immune system and help cells to engulf and absorb germs. °· Probiotics are good bacteria that increase your T cells.  These can be found in yogurt and are available in supplements such as Culturelle or Align. °· Moderate exercise increases the strength of your immune  system and your ability to recover from illness.  I suggest 3 to 5 moderate intensity 30 minute workouts per week.   °· Sleep is another component of maintaining a strong immune system.  It enables your body to recuperate from the day's activities, stress and work.  My recommendation is to get between 7 and 9 hours of sleep per night. °· If you smoke, try to quit completely or at least cut down.  Drink alcohol only in moderation if at all.  No more than 2 drinks daily for men or 1 for women. °· Get a flu vaccine early in the fall or if you have not gotten one yet, once this illness has run its course.  If you are over 65, a smoker, or an asthmatic, get a pneumococcal vaccine. °· My final recommendation is to maintain a healthy weight.  Excess weight can impair the immune system by interfering with the way the immune system deals with invading viruses or   bacteria. ° ° ° °Influenza °Influenza ("the flu") is a viral infection of the respiratory tract. It occurs more often in winter months because people spend more time in close contact with one another. Influenza can make you feel very sick. Influenza easily spreads from person to person (contagious). °CAUSES  °Influenza is caused by a virus that infects the respiratory tract. You can catch the virus by breathing in droplets from an infected person's cough or sneeze. You can also catch the virus by touching something that was recently contaminated with the virus and then touching your mouth, nose, or eyes. °RISKS AND COMPLICATIONS °You may be at risk for a more severe case of influenza if you smoke cigarettes, have diabetes, have chronic heart disease (such as heart failure) or lung disease (such as asthma), or if you have a weakened immune system. Elderly people and pregnant women are also at risk for more serious infections. The most common problem of influenza is a lung infection (pneumonia). Sometimes, this problem can require emergency medical care and may be life  threatening. °SIGNS AND SYMPTOMS  °Symptoms typically last 4 to 10 days and may include: °· Fever. °· Chills. °· Headache, body aches, and muscle aches. °· Sore throat. °· Chest discomfort and cough. °· Poor appetite. °· Weakness or feeling tired. °· Dizziness. °· Nausea or vomiting. °DIAGNOSIS  °Diagnosis of influenza is often made based on your history and a physical exam. A nose or throat swab test can be done to confirm the diagnosis. °TREATMENT  °In mild cases, influenza goes away on its own. Treatment is directed at relieving symptoms. For more severe cases, your health care provider may prescribe antiviral medicines to shorten the sickness. Antibiotic medicines are not effective because the infection is caused by a virus, not by bacteria. °HOME CARE INSTRUCTIONS °· Take medicines only as directed by your health care provider. °· Use a cool mist humidifier to make breathing easier. °· Get plenty of rest until your temperature returns to normal. This usually takes 3 to 4 days. °· Drink enough fluid to keep your urine clear or pale yellow. °· Cover your mouth and nose when coughing or sneezing, and wash your hands well to prevent the virus from spreading. °· Stay home from work or school until the fever is gone for at least 1 full day. °PREVENTION  °An annual influenza vaccination (flu shot) is the best way to avoid getting influenza. An annual flu shot is now routinely recommended for all adults in the [place] °SEEK MEDICAL CARE IF: °· You experience chest pain, your cough worsens, or you produce more mucus. °· You have nausea, vomiting, or diarrhea. °· Your fever returns or gets worse. °SEEK IMMEDIATE MEDICAL CARE IF: °· You have trouble breathing, you become short of breath, or your skin or nails become bluish. °· You have severe pain or stiffness in the neck. °· You develop a sudden headache, or pain in the face or ear. °· You have nausea or vomiting that you cannot control. °MAKE SURE YOU:  °· Understand these  instructions. °· Will watch your condition. °· Will get help right away if you are not doing well or get worse. °Document Released: 08/24/2000 Document Revised: 01/11/2014 Document Reviewed: 11/26/2011 °[employer]® Patient Information ©2015 [employer]. This information is not intended to replace advice given to you by your health care provider. Make sure you discuss any questions you have with your health care provider. ° °

## 2014-08-23 NOTE — ED Provider Notes (Signed)
Chief Complaint   URI and Cough   History of Present Illness   Amanda Larson is a [age] year old female with a 2 day history of fever to 101, aches, chills, swweats, cough productive of bloody sputum, chest tightness, wheezing, chest pain, diarrhea, abdominal pain, nausea, sores in mouth, sore throat, nasal congestion with yellow rhinorrhea, headache, and sinus pressure.  Review of Systems   Other than as noted above, the patient denies any of the following symptoms: Systemic:  No fevers, chills, sweats, or myalgias. Eye:  No redness or discharge. ENT:  No ear pain, headache, nasal congestion, drainage, sinus pressure, or sore throat. Neck:  No neck pain, stiffness, or swollen glands. Lungs:  No cough, sputum production, hemoptysis, wheezing, chest tightness, shortness of breath or chest pain. GI:  No abdominal pain, nausea, vomiting or diarrhea.  PMFSH   Past medical history, family history, social history, meds, and allergies were reviewed. Allergic to Biaxin and Septra.  She has a history of depression and takes Celexa.  Physical exam   Vital signs:  BP 142/115 mmHg  Pulse 93  Temp(Src) 98.4 F (36.9 C) (Oral)  Resp 18  SpO2 95%  LMP 01/17/2014 General:  Alert and oriented.  In no distress.  Skin warm and dry. Eye:  No conjunctival injection or drainage. Lids were normal. ENT:  TMs and canals were normal, without erythema or inflammation.  Nasal mucosa was clear and uncongested, without drainage.  Mucous membranes were moist.  Pharynx was clear with no exudate or drainage.  There were no oral ulcerations or lesions. Neck:  Supple, no adenopathy, tenderness or mass. Lungs:  No respiratory distress.  Lungs were clear to auscultation, without wheezes, rales or rhonchi.  Breath sounds were clear and equal bilaterally.  Heart:  Regular rhythm, without gallops, murmers or rubs. Skin:  Clear, warm, and dry, without rash or lesions.  Labs   Results for orders placed or performed  during the hospital encounter of 08/23/14  POCT rapid strep A [facility]  Result Value Ref Range   Streptococcus, Group A Screen (Direct) NEGATIVE NEGATIVE     Radiology   Dg Chest 2 View  08/23/2014   CLINICAL DATA:  Initial encounter for 2 day history of fever with hemoptysis.  EXAM: CHEST  2 VIEW  COMPARISON:  03/10/2014.  FINDINGS: The heart size and mediastinal contours are within normal limits. Both lungs are clear. The visualized skeletal structures are unremarkable.  IMPRESSION: No active cardiopulmonary disease.   Electronically Signed   By: Kennith CenterEric  Mansell M.D.   On: 08/23/2014 14:34   Assessment     The primary encounter diagnosis was Influenza-like illness. A diagnosis of Hemoptysis was also pertinent to this visit.  There is no evidence of pneumonia, strep throat, sinusitis, otitis media.    Plan    1.  Meds:  The following meds were prescribed:   New Prescriptions   BENZONATATE (TESSALON) 200 MG CAPSULE    Take 1 capsule (200 mg total) by mouth 3 (three) times daily as needed for cough.   IPRATROPIUM (ATROVENT) 0.06 % NASAL SPRAY    Place 2 sprays into both nostrils 4 (four) times daily.   TRAMADOL (ULTRAM) 50 MG TABLET    Take 2 tablets (100 mg total) by mouth every 8 (eight) hours as needed.    2.  Patient Education/Counseling:  The patient was given appropriate handouts, self care instructions, and instructed in symptomatic relief.  Instructed to get extra fluids  and extra rest.    3.  Follow up:  The patient was told to follow up here if no better in 3 to 4 days, or sooner if becoming worse in any way, and given some red flag symptoms such as increasing fever, difficulty breathing, chest pain, or persistent vomiting which would prompt immediate return.       Reuben Likesavid C Veronica Guerrant, MD 08/23/14 [phone]

## 2014-08-23 NOTE — ED Notes (Signed)
Patient going to radiology at [facility] for [facility] xray

## 2014-08-23 NOTE — ED Notes (Signed)
Patient has multiple complaints: body aches, sneezing, sore throat, mouth soreness, cough, reports fever 101

## 2014-08-25 LAB — CULTURE, GROUP A STREP

## 2015-09-22 ENCOUNTER — Encounter (HOSPITAL_COMMUNITY): Payer: Self-pay | Admitting: Emergency Medicine

## 2015-09-22 ENCOUNTER — Emergency Department (HOSPITAL_COMMUNITY)
Admission: EM | Admit: 2015-09-22 | Discharge: 2015-09-22 | Disposition: A | Discharge status: Home / Self Care | Payer: BLUE CROSS/BLUE SHIELD | Source: Home / Self Care | Attending: Family Medicine | Admitting: Family Medicine

## 2015-09-22 DIAGNOSIS — S39012A Strain of muscle, fascia and tendon of lower back, initial encounter: Secondary | ICD-10-CM

## 2015-09-22 MED ORDER — DICLOFENAC POTASSIUM 50 MG PO TABS: 50.0000 mg | ORAL_TABLET | Freq: Three times a day (TID) | ORAL | Status: DC

## 2015-09-22 MED ORDER — CYCLOBENZAPRINE HCL 5 MG PO TABS: 5.0000 mg | ORAL_TABLET | Freq: Three times a day (TID) | ORAL | Status: DC | PRN

## 2015-09-22 NOTE — Discharge Instructions (Signed)
Heat and medicine as needed, see your doctor if further problems. °

## 2015-09-22 NOTE — ED Notes (Signed)
Woke one morning, turned to get out of bed and pain started.  Pain in right lower back and side

## 2015-09-22 NOTE — ED Provider Notes (Signed)
CSN: [id number]     Arrival date & time 09/22/15  1301 History   First MD Initiated Contact with Patient 09/22/15 1345     Chief Complaint  Patient presents with  . Muscle Pain   (Consider location/radiation/quality/duration/timing/severity/associated sxs/prior Treatment) Patient is a 50 y.o. female presenting with back pain. The history is provided by the patient.  Back Pain Location:  Lumbar spine Quality:  Stiffness Radiates to:  Does not radiate Pain severity:  Moderate Onset quality:  Gradual Duration:  2 days Progression:  Unchanged Chronicity:  New Context comment:  Felt pain getting out of bed Relieved by:  None tried Worsened by:  Nothing tried Associated symptoms: no abdominal pain, no bladder incontinence, no leg pain, no numbness, no paresthesias and no perianal numbness   Risk factors: obesity     Past Medical History  Diagnosis Date  . Bronchitis   . Depression   . Osteoarthritis   . Chronic back pain    Past Surgical History  Procedure Laterality Date  . Cholecystectomy    . Cesarean section    . Tubal ligation     Family History  Problem Relation Age of Onset  . Cancer Father    Social History  Substance Use Topics  . Smoking status: Former Smoker    Quit date: 07/31/2007  . Smokeless tobacco: None  . Alcohol Use: No   OB History    No data available     Review of Systems  Constitutional: Negative.   Cardiovascular: Negative.   Gastrointestinal: Negative.  Negative for abdominal pain.  Genitourinary: Negative.  Negative for bladder incontinence.  Musculoskeletal: Positive for back pain. Negative for gait problem.  Skin: Negative.  Negative for rash.  Neurological: Negative for numbness and paresthesias.  All other systems reviewed and are negative.   Allergies  Clarithromycin and Sulfa antibiotics  Home Medications   Prior to Admission medications   Medication Sig Start Date End Date Taking? Authorizing Provider  albuterol  (PROVENTIL HFA;VENTOLIN HFA) 108 (90 BASE) MCG/ACT inhaler Inhale 2 puffs into the lungs every 6 (six) hours as needed. 03/10/14   Rodolph BongEvan S Corey, MD  benzonatate (TESSALON) 200 MG capsule Take 1 capsule (200 mg total) by mouth 3 (three) times daily as needed for cough. Patient not taking: Reported on 09/22/2015 08/23/14   Reuben Likesavid C Keller, MD  citalopram (CELEXA) 10 MG tablet Take 1 tablet (10 mg total) by mouth daily. 07/27/14   Harle BattiestElizabeth Tysinger, NP  cyclobenzaprine (FLEXERIL) 5 MG tablet Take 1 tablet (5 mg total) by mouth 3 (three) times daily as needed for muscle spasms. 09/22/15   Linna HoffJames D Raymound Katich, MD  diclofenac (CATAFLAM) 50 MG tablet Take 1 tablet (50 mg total) by mouth 3 (three) times daily. 09/22/15   Linna HoffJames D Kamir Selover, MD  diclofenac (VOLTAREN) 75 MG EC tablet Take 1 tablet (75 mg total) by mouth 2 (two) times daily. 07/27/14   Harle BattiestElizabeth Tysinger, NP  fexofenadine (ALLEGRA) 60 MG tablet Take 1 tablet (60 mg total) by mouth 2 (two) times daily. X 10 days 07/31/11 07/30/12  Jimmie MollyPaolo Coll, MD  fluticasone [facility](FLONASE) 50 MCG/ACT nasal spray Place 2 sprays into the nose daily. Patient not taking: Reported on 07/27/2014 06/03/12   Johnsie Kindredarmen L Chatten, NP  ipratropium (ATROVENT) 0.06 % nasal spray Place 2 sprays into both nostrils 4 (four) times daily. 08/23/14   Reuben Likesavid C Keller, MD  predniSONE (DELTASONE) 10 MG tablet Take 3 tablets for 5 days, then 2 tablets for 3 days,  then 1 tablet for 3 days. Patient not taking: Reported on 09/22/2015 07/30/14   Charm Rings, MD  traMADol (ULTRAM) 50 MG tablet Take 1 tablet (50 mg total) by mouth every 6 (six) hours as needed. Maximum dose= 8 tablets per day 07/27/14   Harle Battiest, NP  traMADol (ULTRAM) 50 MG tablet Take 2 tablets (100 mg total) by mouth every 8 (eight) hours as needed. 08/23/14   Reuben Likes, MD  triamcinolone cream (KENALOG) 0.5 % Apply 1 application topically 2 (two) times daily. 07/30/14   Charm Rings, MD   Meds Ordered and Administered this Visit    Medications - No data to display  BP 138/85 mmHg  Pulse 79  Temp(Src) 98.2 F (36.8 C) (Oral)  Resp 16  SpO2 95%  LMP 01/17/2014 No data found.   Physical Exam  Constitutional: She is oriented to person, place, and time. She appears well-developed and well-nourished. No distress.  Abdominal: Soft. Bowel sounds are normal.  Musculoskeletal: She exhibits tenderness.       Lumbar back: She exhibits tenderness, pain and spasm. She exhibits normal pulse.       Back:  Neurological: She is alert and oriented to person, place, and time.  Skin: Skin is warm and dry.  Nursing note and vitals reviewed.   ED Course  Procedures (including critical care time)  Labs Review Labs Reviewed - No data to display  Imaging Review No results found.   Visual Acuity Review  Right Eye Distance:   Left Eye Distance:   Bilateral Distance:    Right Eye Near:   Left Eye Near:    Bilateral Near:         MDM   1. Low back strain, initial encounter        Linna Hoff, MD 09/22/15 2103

## 2015-09-29 ENCOUNTER — Emergency Department (HOSPITAL_COMMUNITY)
Admission: EM | Admit: 2015-09-29 | Discharge: 2015-09-29 | Disposition: A | Discharge status: Home / Self Care | Payer: BLUE CROSS/BLUE SHIELD | Source: Home / Self Care | Attending: Family Medicine | Admitting: Family Medicine

## 2015-09-29 ENCOUNTER — Encounter (HOSPITAL_COMMUNITY): Payer: Self-pay | Admitting: *Deleted

## 2015-09-29 DIAGNOSIS — M545 Low back pain: Secondary | ICD-10-CM | POA: Diagnosis not present

## 2015-09-29 DIAGNOSIS — G8929 Other chronic pain: Secondary | ICD-10-CM | POA: Diagnosis not present

## 2015-09-29 MED ORDER — METHOCARBAMOL 500 MG PO TABS: 500.0000 mg | ORAL_TABLET | Freq: Three times a day (TID) | ORAL | Status: DC

## 2015-09-29 NOTE — Discharge Instructions (Signed)
Use medicine as needed and see orthopedist for further care

## 2015-09-29 NOTE — ED Provider Notes (Signed)
CSN: [id number]     Arrival date & time 09/29/15  1301 History   First MD Initiated Contact with Patient 09/29/15 1311     Chief Complaint  Patient presents with  . Back Pain   (Consider location/radiation/quality/duration/timing/severity/associated sxs/prior Treatment) Patient is a 50 y.o. female presenting with back pain. The history is provided by the patient.  Back Pain Location:  Lumbar spine Quality:  Stiffness Radiates to:  Does not radiate Pain severity:  Mild Onset quality:  Gradual Duration:  3 weeks Progression:  Waxing and waning Chronicity:  New Context: lifting heavy objects   Context comment:  Seen 1/12 and treated with relief but sx persist and pt concerned about addiction. onset getting oob. Associated symptoms: no abdominal pain, no leg pain, no numbness, no paresthesias, no perianal numbness and no tingling   Risk factors: lack of exercise and obesity   Risk factors comment:  Morbid obesity   Past Medical History  Diagnosis Date  . Bronchitis   . Depression   . Osteoarthritis   . Chronic back pain    Past Surgical History  Procedure Laterality Date  . Cholecystectomy    . Cesarean section    . Tubal ligation     Family History  Problem Relation Age of Onset  . Cancer Father    Social History  Substance Use Topics  . Smoking status: Former Smoker    Quit date: 07/31/2007  . Smokeless tobacco: None  . Alcohol Use: No   OB History    No data available     Review of Systems  Constitutional: Negative.   Cardiovascular: Negative.   Gastrointestinal: Negative.  Negative for abdominal pain.  Genitourinary: Negative.   Musculoskeletal: Positive for back pain.  Neurological: Negative for tingling, numbness and paresthesias.  All other systems reviewed and are negative.   Allergies  Clarithromycin and Sulfa antibiotics  Home Medications   Prior to Admission medications   Medication Sig Start Date End Date Taking? Authorizing Provider    albuterol (PROVENTIL HFA;VENTOLIN HFA) 108 (90 BASE) MCG/ACT inhaler Inhale 2 puffs into the lungs every 6 (six) hours as needed. 03/10/14   Rodolph Bong, MD  benzonatate (TESSALON) 200 MG capsule Take 1 capsule (200 mg total) by mouth 3 (three) times daily as needed for cough. Patient not taking: Reported on 09/22/2015 08/23/14   Reuben Likes, MD  citalopram (CELEXA) 10 MG tablet Take 1 tablet (10 mg total) by mouth daily. 07/27/14   Harle Battiest, NP  cyclobenzaprine (FLEXERIL) 5 MG tablet Take 1 tablet (5 mg total) by mouth 3 (three) times daily as needed for muscle spasms. 09/22/15   Linna Hoff, MD  diclofenac (CATAFLAM) 50 MG tablet Take 1 tablet (50 mg total) by mouth 3 (three) times daily. 09/22/15   Linna Hoff, MD  diclofenac (VOLTAREN) 75 MG EC tablet Take 1 tablet (75 mg total) by mouth 2 (two) times daily. 07/27/14   Harle Battiest, NP  fexofenadine (ALLEGRA) 60 MG tablet Take 1 tablet (60 mg total) by mouth 2 (two) times daily. X 10 days 07/31/11 07/30/12  Jimmie Molly, MD  fluticasone [facility]) 50 MCG/ACT nasal spray Place 2 sprays into the nose daily. Patient not taking: Reported on 07/27/2014 06/03/12   Johnsie Kindred, NP  ipratropium (ATROVENT) 0.06 % nasal spray Place 2 sprays into both nostrils 4 (four) times daily. 08/23/14   Reuben Likes, MD  methocarbamol (ROBAXIN) 500 MG tablet Take 1 tablet (500 mg total) by  mouth 3 (three) times daily. 09/29/15   Linna Hoff, MD  predniSONE (DELTASONE) 10 MG tablet Take 3 tablets for 5 days, then 2 tablets for 3 days, then 1 tablet for 3 days. Patient not taking: Reported on 09/22/2015 07/30/14   Charm Rings, MD  traMADol (ULTRAM) 50 MG tablet Take 1 tablet (50 mg total) by mouth every 6 (six) hours as needed. Maximum dose= 8 tablets per day 07/27/14   Harle Battiest, NP  traMADol (ULTRAM) 50 MG tablet Take 2 tablets (100 mg total) by mouth every 8 (eight) hours as needed. 08/23/14   Reuben Likes, MD  triamcinolone cream  (KENALOG) 0.5 % Apply 1 application topically 2 (two) times daily. 07/30/14   Charm Rings, MD   Meds Ordered and Administered this Visit  Medications - No data to display  BP 148/74 mmHg  Pulse 83  Temp(Src) 98.1 F (36.7 C) (Oral)  Resp 16  SpO2 97%  LMP 01/17/2014 No data found.   Physical Exam  Constitutional: She is oriented to person, place, and time. She appears well-developed and well-nourished.  Abdominal: Soft. Bowel sounds are normal.  Musculoskeletal: She exhibits tenderness.       Lumbar back: She exhibits decreased range of motion, tenderness, bony tenderness, pain and spasm. She exhibits normal pulse.       Back:  Neurological: She is alert and oriented to person, place, and time.  Skin: Skin is warm and dry.  Nursing note and vitals reviewed.   ED Course  Procedures (including critical care time)  Labs Review Labs Reviewed - No data to display  Imaging Review No results found.   Visual Acuity Review  Right Eye Distance:   Left Eye Distance:   Bilateral Distance:    Right Eye Near:   Left Eye Near:    Bilateral Near:         MDM   1. Chronic low back pain        Linna Hoff, MD 09/29/15 1334

## 2015-09-29 NOTE — ED Notes (Signed)
Pt  Reports   Pain  r    Side     Seen  Recently  For back  Pain      Pain is  Worse  On movement

## 2016-11-16 ENCOUNTER — Ambulatory Visit: Payer: Self-pay

## 2016-11-16 ENCOUNTER — Other Ambulatory Visit: Payer: Self-pay | Admitting: Occupational Medicine

## 2016-11-16 DIAGNOSIS — M545 Low back pain: Secondary | ICD-10-CM

## 2016-11-16 DIAGNOSIS — M542 Cervicalgia: Secondary | ICD-10-CM

## 2017-01-10 ENCOUNTER — Ambulatory Visit (HOSPITAL_COMMUNITY)
Admission: EM | Admit: 2017-01-10 | Discharge: 2017-01-10 | Disposition: A | Discharge status: Home / Self Care | Payer: PRIVATE HEALTH INSURANCE | Attending: Nurse Practitioner | Admitting: Nurse Practitioner

## 2017-01-10 ENCOUNTER — Encounter (HOSPITAL_COMMUNITY): Payer: Self-pay | Admitting: Emergency Medicine

## 2017-01-10 DIAGNOSIS — K219 Gastro-esophageal reflux disease without esophagitis: Secondary | ICD-10-CM | POA: Diagnosis not present

## 2017-01-10 MED ORDER — OMEPRAZOLE 20 MG PO CPDR: 20.0000 mg | DELAYED_RELEASE_CAPSULE | Freq: Every day | ORAL | 0 refills | Status: DC

## 2017-01-10 NOTE — ED Triage Notes (Signed)
Here for ST onset 1 weeks associated w/dry cough, itching and burning sensation due to cough  Denies fevers, chills  A&O x4... NAD

## 2017-01-10 NOTE — ED Provider Notes (Signed)
CSN: [id number]     Arrival date & time 01/10/17  1008 History   First MD Initiated Contact with Patient 01/10/17 1051     Chief Complaint  Patient presents with  . Allergies   (Consider location/radiation/quality/duration/timing/severity/associated sxs/prior Treatment)  Patient complains of dyspepsia. Symptoms have been present for a while but has been significant for approximately 1 week. Symptoms include belching and eructation, feeling as if she is choking on food, cough, heartburn, need to clear throat frequently and regurgitation of undigested food. The patient denies chest pain, abdominal pain, nausea, vomiting or shortness of breath. Symptoms appear to be worsened at night when lying down. Risk factors present for GERD include morbid obesity. Risk factors absent for GERD are tobacco abuse and alcohol use. Studies performed so far include none. Treatments tried so far include none. Results of treatment: not applicable. Currently, the symptoms are moderate and occur approximately several times per day.       Past Medical History:  Diagnosis Date  . Bronchitis   . Chronic back pain   . Depression   . Osteoarthritis    Past Surgical History:  Procedure Laterality Date  . CESAREAN SECTION    . CHOLECYSTECTOMY    . TUBAL LIGATION     Family History  Problem Relation Age of Onset  . Cancer Father    Social History  Substance Use Topics  . Smoking status: Former Smoker    Quit date: 07/31/2007  . Smokeless tobacco: Never Used  . Alcohol use No   OB History    No data available     Review of Systems  Respiratory: Positive for cough.   Gastrointestinal:       Frequent belching, heartburn, need to clear throat frequently, regurgitation of undigested food     Allergies  Clarithromycin and Sulfa antibiotics  Home Medications   Prior to Admission medications   Medication Sig Start Date End Date Taking? Authorizing Provider  omeprazole (PRILOSEC) 20 MG capsule Take 1  capsule (20 mg total) by mouth daily. 01/10/17 02/09/17  Lurline IdolSamantha Granvel Proudfoot, FNP   Meds Ordered and Administered this Visit  Medications - No data to display  BP 116/78 (BP Location: Left Arm)   Pulse 71   Temp 98.1 F (36.7 C) (Oral)   Resp 16   LMP 01/17/2014   SpO2 96%  No data found.   Physical Exam  Constitutional: She is oriented to person, place, and time. She appears well-developed and well-nourished. No distress.  Morbidly obese   HENT:  Head: Normocephalic and atraumatic.  Mouth/Throat: Oropharynx is clear and moist. No oropharyngeal exudate.  Eyes: Conjunctivae and EOM are normal. Pupils are equal, round, and reactive to light.  Neck: Normal range of motion. Neck supple. No tracheal deviation present. No thyromegaly present.  Cardiovascular: Normal rate, regular rhythm and normal heart sounds.   Pulmonary/Chest: Effort normal and breath sounds normal. No stridor.  Abdominal: Soft. Bowel sounds are normal. She exhibits no distension.  Musculoskeletal: Normal range of motion.  Neurological: She is alert and oriented to person, place, and time.  Skin: Skin is warm and dry.  Psychiatric: She has a normal mood and affect. Her behavior is normal. Thought content normal.    Urgent Care Course     Procedures (including critical care time)  Labs Review Labs Reviewed - No data to display  Imaging Review No results found.   Visual Acuity Review  Right Eye Distance:   Left Eye Distance:   Bilateral Distance:  Right Eye Near:   Left Eye Near:    Bilateral Near:         MDM   1. Gastroesophageal reflux disease without esophagitis    1) Omeprazole 20 mg daily x 30 days  2) GI referral  3) dietary & other modifications (eating small frequent meals, elevate head of bed at night, do not eat before bed)  4) elimination of dietary triggers (e.g. fatty foods, caffeine, chocolate, spicy foods, food with high fat content, carbonated beverages, and peppermint) 5)  weight loss      Lurline Idol, FNP 01/10/17 1122

## 2017-02-06 ENCOUNTER — Ambulatory Visit (INDEPENDENT_AMBULATORY_CARE_PROVIDER_SITE_OTHER): Payer: PRIVATE HEALTH INSURANCE | Admitting: Internal Medicine

## 2017-02-06 ENCOUNTER — Encounter: Payer: Self-pay | Admitting: Internal Medicine

## 2017-02-06 VITALS — BP 118/86 | HR 88 | Temp 98.6°F | Ht 61.0 in | Wt 338.0 lb

## 2017-02-06 DIAGNOSIS — L409 Psoriasis, unspecified: Secondary | ICD-10-CM | POA: Diagnosis not present

## 2017-02-06 DIAGNOSIS — G8929 Other chronic pain: Secondary | ICD-10-CM

## 2017-02-06 DIAGNOSIS — M545 Low back pain: Secondary | ICD-10-CM

## 2017-02-06 DIAGNOSIS — Z0000 Encounter for general adult medical examination without abnormal findings: Secondary | ICD-10-CM

## 2017-02-06 DIAGNOSIS — F339 Major depressive disorder, recurrent, unspecified: Secondary | ICD-10-CM

## 2017-02-06 DIAGNOSIS — J4520 Mild intermittent asthma, uncomplicated: Secondary | ICD-10-CM

## 2017-02-06 MED ORDER — CITALOPRAM HYDROBROMIDE 20 MG PO TABS: 20.0000 mg | ORAL_TABLET | Freq: Every day | ORAL | 0 refills | Status: DC

## 2017-02-06 MED ORDER — TRAMADOL HCL 50 MG PO TABS: 50.0000 mg | ORAL_TABLET | Freq: Two times a day (BID) | ORAL | 0 refills | Status: DC

## 2017-02-06 MED ORDER — ALBUTEROL SULFATE HFA 108 (90 BASE) MCG/ACT IN AERS: 2.0000 | INHALATION_SPRAY | Freq: Four times a day (QID) | RESPIRATORY_TRACT | 2 refills | Status: DC | PRN

## 2017-02-06 MED ORDER — DICLOFENAC SODIUM 75 MG PO TBEC: 75.0000 mg | DELAYED_RELEASE_TABLET | Freq: Two times a day (BID) | ORAL | 0 refills | Status: DC

## 2017-02-06 NOTE — Progress Notes (Signed)
   [facility] Phone: [phone]  Subjective:  Amanda Larson is a 51 year old female presenting to clinic for a new patient appointment.  Psoriasis: Has a history of psoriasis. Has been on cream in the past that has worked really well. Has followed with Dr. Margo AyeHall in the past, but needs new referral. Psoriasis present on her arms and legs.  Depression: Has been out of Celexa for 2 weeks. Feels like her depression has gotten much worse since stopping the Celexa. She is intermittently tearful out of nowhere. She also notes difficulty sleeping, poor appetite, and fatigue. No SI/HI.  Asthma: Has been well-controlled recently. Only using Albuterol a few times per month. No coughing. No shortness of breath.  ROS: See HPI for pertinent positives and negatives  Past Medical History- psoriasis, depression, asthma, obesity, chronic low back pain.  Past Surgical History- Cholecystectomy in 1985, C/S and BTL in 2000.  Family history: Paternal grandfather- heart disease, stroke, drug abuse Daughter- depression Maternal aunt- diabetes, HTN Maternal uncle- diabetes Mother- HLD  Social history- patient is a former smoker. Quit in 2008. Does not drink alcohol. Does not use recreational drugs.  Objective: BP 118/86   Pulse 88   Temp 98.6 F (37 C) (Oral)   Ht 5\' 1"  (1.549 m)   Wt (!) 338 lb (153.3 kg)   LMP 01/17/2014   SpO2 98%   BMI 63.86 kg/m  Gen: NAD, alert, cooperative with exam HEENT: NCAT, EOMI, MMM Neck: FROM, supple CV: RRR, no murmur Resp: CTABL, no wheezes, normal work of breathing Msk: No edema, warm, normal tone, moves UE/LE spontaneously Neuro: Alert and oriented, no gross deficits Skin: Erythematous patches with overlying gray scale present on the extensor surfaces of the arms and flexor surfaces of the legs. Psych: Intermittently tearful, normal rate of speech  Assessment/Plan: Psoriasis: Uncontrolled. Patient with active flare. Would like to be  referred back to Dermatologist (Dr. Margo AyeHall). - Referral to Dermatology.  Depression: Uncontrolled since stopping Celexa, but previously was well-controlled. Has not taken Celexa for 2 weeks. - Refill Celexa - Follow-up in 1-2 months to make sure the medication is working like it used to  Asthma: Well-controlled. Only using her inhaler intermittently. - Refill Albuterol  Health Care Maintenance: - Patient given information to schedule mammogram (last mammogram was in 1998) - Plan to send in referral for colonoscopy at next visit - Patient will return in 1-2 months for pap   Willadean CarolKaty Christan Ciccarelli, MD PGY-2

## 2017-02-06 NOTE — Patient Instructions (Signed)
It was so nice to see you!  I have refilled your prescriptions.   [facility] to have [facility].  We will see you back in the next couple of months for a physical.  -Dr. Nancy MarusMayo

## 2017-02-06 NOTE — Progress Notes (Signed)
ROI completed and [facility]. Jakita Dutkiewicz, Maryjo RochesterJessica Dawn, CMA

## 2017-02-08 ENCOUNTER — Encounter: Payer: Self-pay | Admitting: Internal Medicine

## 2017-02-08 DIAGNOSIS — M545 Low back pain, unspecified: Secondary | ICD-10-CM | POA: Insufficient documentation

## 2017-02-08 DIAGNOSIS — G8929 Other chronic pain: Secondary | ICD-10-CM | POA: Insufficient documentation

## 2017-02-08 DIAGNOSIS — F329 Major depressive disorder, single episode, unspecified: Secondary | ICD-10-CM | POA: Insufficient documentation

## 2017-02-08 DIAGNOSIS — Z1211 Encounter for screening for malignant neoplasm of colon: Secondary | ICD-10-CM | POA: Insufficient documentation

## 2017-02-08 DIAGNOSIS — L409 Psoriasis, unspecified: Secondary | ICD-10-CM | POA: Insufficient documentation

## 2017-02-08 DIAGNOSIS — F32A Depression, unspecified: Secondary | ICD-10-CM | POA: Insufficient documentation

## 2017-02-08 DIAGNOSIS — J45909 Unspecified asthma, uncomplicated: Secondary | ICD-10-CM | POA: Insufficient documentation

## 2017-02-08 DIAGNOSIS — Z0000 Encounter for general adult medical examination without abnormal findings: Secondary | ICD-10-CM | POA: Insufficient documentation

## 2017-02-08 NOTE — Assessment & Plan Note (Signed)
-   Patient given information to schedule mammogram (last mammogram was in 1998) - Plan to send in referral for colonoscopy at next visit - Patient will return in 1-2 months for pap

## 2017-02-08 NOTE — Assessment & Plan Note (Signed)
Uncontrolled since stopping Celexa, but previously was well-controlled. Has not taken Celexa for 2 weeks. - Refill Celexa - Follow-up in 1-2 months to make sure the medication is working like it used to

## 2017-02-08 NOTE — Assessment & Plan Note (Signed)
Uncontrolled. Patient with active flare. Would like to be referred back to Dermatologist (Dr. Margo AyeHall). - Referral to Dermatology.

## 2017-02-08 NOTE — Assessment & Plan Note (Signed)
Well-controlled. Only using her inhaler intermittently. - Refill Albuterol

## 2017-04-08 ENCOUNTER — Other Ambulatory Visit: Payer: Self-pay | Admitting: Internal Medicine

## 2017-04-08 NOTE — Telephone Encounter (Signed)
Please let Amanda Larson know that her Tramadol prescription is at the front desk for her to pick up. Thanks!

## 2017-04-08 NOTE — Telephone Encounter (Signed)
Pt contacted and informed of rx ready for pick up at [facility]. 

## 2017-04-15 ENCOUNTER — Encounter: Payer: Self-pay | Admitting: Internal Medicine

## 2017-04-15 ENCOUNTER — Ambulatory Visit (INDEPENDENT_AMBULATORY_CARE_PROVIDER_SITE_OTHER): Payer: PRIVATE HEALTH INSURANCE | Admitting: Internal Medicine

## 2017-04-15 ENCOUNTER — Other Ambulatory Visit (HOSPITAL_COMMUNITY)
Admission: RE | Admit: 2017-04-15 | Discharge: 2017-04-15 | Disposition: A | Discharge status: Home / Self Care | Payer: PRIVATE HEALTH INSURANCE | Source: Ambulatory Visit | Attending: Family Medicine | Admitting: Family Medicine

## 2017-04-15 VITALS — BP 120/80 | HR 91 | Temp 98.6°F | Ht 61.5 in | Wt 326.0 lb

## 2017-04-15 DIAGNOSIS — Z124 Encounter for screening for malignant neoplasm of cervix: Secondary | ICD-10-CM | POA: Insufficient documentation

## 2017-04-15 DIAGNOSIS — Z1211 Encounter for screening for malignant neoplasm of colon: Secondary | ICD-10-CM | POA: Diagnosis not present

## 2017-04-15 DIAGNOSIS — Z131 Encounter for screening for diabetes mellitus: Secondary | ICD-10-CM | POA: Diagnosis not present

## 2017-04-15 DIAGNOSIS — Z0000 Encounter for general adult medical examination without abnormal findings: Secondary | ICD-10-CM | POA: Diagnosis not present

## 2017-04-15 DIAGNOSIS — Z13220 Encounter for screening for lipoid disorders: Secondary | ICD-10-CM

## 2017-04-15 DIAGNOSIS — Z114 Encounter for screening for human immunodeficiency virus [HIV]: Secondary | ICD-10-CM

## 2017-04-15 DIAGNOSIS — Z791 Long term (current) use of non-steroidal anti-inflammatories (NSAID): Secondary | ICD-10-CM | POA: Diagnosis not present

## 2017-04-15 LAB — POCT GLYCOSYLATED HEMOGLOBIN (HGB A1C): HEMOGLOBIN A1C: 7.6

## 2017-04-15 MED ORDER — CITALOPRAM HYDROBROMIDE 20 MG PO TABS: 20.0000 mg | ORAL_TABLET | Freq: Every day | ORAL | 0 refills | Status: DC

## 2017-04-15 MED ORDER — OMEPRAZOLE 20 MG PO CPDR: 20.0000 mg | DELAYED_RELEASE_CAPSULE | Freq: Every day | ORAL | 0 refills | Status: DC

## 2017-04-15 MED ORDER — ALBUTEROL SULFATE HFA 108 (90 BASE) MCG/ACT IN AERS: 2.0000 | INHALATION_SPRAY | Freq: Four times a day (QID) | RESPIRATORY_TRACT | 2 refills | Status: DC | PRN

## 2017-04-15 MED ORDER — TRAMADOL HCL 50 MG PO TABS: 50.0000 mg | ORAL_TABLET | Freq: Two times a day (BID) | ORAL | 0 refills | Status: DC

## 2017-04-15 MED ORDER — DICLOFENAC SODIUM 75 MG PO TBEC: 75.0000 mg | DELAYED_RELEASE_TABLET | Freq: Two times a day (BID) | ORAL | 0 refills | Status: DC

## 2017-04-15 NOTE — Patient Instructions (Addendum)
It was so nice to see you!  I will call you with the results of your pap. I have also ordered some labs today and I will call you with these results too.   Please schedule a mammogram ASAP.  Please bring the stool cards back to clinic when it is good for you.  We will see you back in 6 months or earlier if needed.  -Dr. Nancy MarusMayo

## 2017-04-15 NOTE — Progress Notes (Signed)
   [facility] Phone: [phone]  Subjective:  Amanda Larson is a 51 year old female presenting to clinic for pap smear and med refills. Not having any side effects to any medications. No chest pain, no shortness of breath, no polyuria/polydipsia, no lower extremity edema, no vaginal discharge. She endorses rash on her forearms and is waiting to see dermatology for her psoriasis.  ROS: See HPI for pertinent positives and negatives  Past Medical History- asthma, psoriasis, chronic low back pain, depression, obesity.  Family history reviewed for today's visit. No changes.  Social history- patient is a former smoker. Quit in 2008.  Objective: BP 120/80   Pulse 91   Temp 98.6 F (37 C) (Oral)   Ht 5' 1.5" (1.562 m)   Wt (!) 326 lb (147.9 kg)   LMP 01/17/2014   SpO2 95%   BMI 60.60 kg/m  Gen: NAD, alert, cooperative with exam HEENT: NCAT, EOMI, MMM CV: RRR, no murmur Resp: CTABL, no wheezes, normal work of breathing GU: External genitalia normal in appearance, no vaginal lesions, vaginal walls normal, cervix normal in appearance, no cervical motion tenderness, no ovarian or adnexal masses appreciated  Msk: No edema Psych: Appropriate behavior  Assessment/Plan: Screening for cervical cancer: Pap performed today. Will call patient with results.  Long term NSAID use: Taking Diclofenac for chronic low back pain. - Check BMP today  Screening for lipid disorders: Not on a statin. Has never had lipid panel checked per our records. - Check lipid panel  Screening for colon cancer: Colonoscopy discussed in detail. Patient states she never wants to have this done because her aunt just went through it. - Agreeable to stool cards. Patient given sample cup and will return cards to lab.  Screening for diabetes: Has never had an A1c per our records. At risk for diabetes due to obesity. - Check A1c  Health Care Maintenance: - HIV screen performed today, since we  are getting other blood work - Patient given information for mammogram   Amanda CarolKaty Braxtin Bamba, MD PGY-3

## 2017-04-16 DIAGNOSIS — Z124 Encounter for screening for malignant neoplasm of cervix: Secondary | ICD-10-CM | POA: Insufficient documentation

## 2017-04-16 DIAGNOSIS — Z1211 Encounter for screening for malignant neoplasm of colon: Secondary | ICD-10-CM | POA: Insufficient documentation

## 2017-04-16 DIAGNOSIS — Z13220 Encounter for screening for lipoid disorders: Secondary | ICD-10-CM | POA: Insufficient documentation

## 2017-04-16 DIAGNOSIS — Z131 Encounter for screening for diabetes mellitus: Secondary | ICD-10-CM | POA: Insufficient documentation

## 2017-04-16 DIAGNOSIS — Z791 Long term (current) use of non-steroidal anti-inflammatories (NSAID): Secondary | ICD-10-CM | POA: Insufficient documentation

## 2017-04-16 NOTE — Assessment & Plan Note (Signed)
Has never had an A1c per our records. At risk for diabetes due to obesity. - Check A1c

## 2017-04-16 NOTE — Assessment & Plan Note (Signed)
Colonoscopy discussed in detail. Patient states she never wants to have this done because her aunt just went through it. - Agreeable to stool cards. Patient given sample cup and will return cards to lab.

## 2017-04-16 NOTE — Assessment & Plan Note (Signed)
Taking Diclofenac for chronic low back pain. - Check BMP today

## 2017-04-16 NOTE — Assessment & Plan Note (Signed)
-   Pap performed today - Will call patient with results 

## 2017-04-16 NOTE — Assessment & Plan Note (Signed)
-   HIV screen performed today, since we are getting other blood work - Patient given information for mammogram

## 2017-04-16 NOTE — Assessment & Plan Note (Signed)
Not on a statin. Has never had lipid panel checked per our records. - Check lipid panel

## 2017-04-17 ENCOUNTER — Telehealth: Payer: Self-pay | Admitting: Internal Medicine

## 2017-04-17 LAB — LIPID PANEL
Chol/HDL Ratio: 6.7 ratio — ABNORMAL HIGH (ref 0.0–4.4)
Cholesterol, Total: 200 mg/dL — ABNORMAL HIGH (ref 100–199)
HDL: 30 mg/dL — ABNORMAL LOW (ref >39)
LDL Calculated: 104 mg/dL — ABNORMAL HIGH (ref 0–99)
TRIGLYCERIDES: 332 mg/dL — AB (ref 0–149)
VLDL Cholesterol Cal: 66 mg/dL — ABNORMAL HIGH (ref 5–40)

## 2017-04-17 LAB — BASIC METABOLIC PANEL
BUN/Creatinine Ratio: 21 (ref 9–23)
BUN: 18 mg/dL (ref 6–24)
CHLORIDE: 101 mmol/L (ref 96–106)
CO2: 23 mmol/L (ref 20–29)
Calcium: 10 mg/dL (ref 8.7–10.2)
Creatinine, Ser: 0.85 mg/dL (ref 0.57–1.00)
GFR calc Af Amer: 92 mL/min/{1.73_m2} (ref >59)
GFR calc non Af Amer: 80 mL/min/{1.73_m2} (ref >59)
GLUCOSE: 165 mg/dL — AB (ref 65–99)
POTASSIUM: 4 mmol/L (ref 3.5–5.2)
SODIUM: 141 mmol/L (ref 134–144)

## 2017-04-17 LAB — HIV ANTIBODY (ROUTINE TESTING W REFLEX): HIV Screen 4th Generation wRfx: NONREACTIVE

## 2017-04-17 NOTE — Telephone Encounter (Signed)
Attempted to call patient this morning to discuss lab results. Left a voicemail for patient to call us back when she has a chance.  Amanda CarolKaty Bhavya Eschete, MD

## 2017-04-18 LAB — CYTOLOGY - PAP
Diagnosis: NEGATIVE
HPV (WINDOPATH): NOT DETECTED

## 2017-05-22 ENCOUNTER — Other Ambulatory Visit: Payer: Self-pay | Admitting: Internal Medicine

## 2017-05-23 NOTE — Telephone Encounter (Signed)
Please let Amanda Larson know that I have refilled her Tramadol. She can pick it up at the front desk. I have given her more tablets this time, so she won't need refills as frequently.

## 2017-05-23 NOTE — Telephone Encounter (Signed)
Pt contacted and informed of rx ready for pick up.   

## 2017-05-31 ENCOUNTER — Encounter (HOSPITAL_COMMUNITY): Payer: Self-pay | Admitting: *Deleted

## 2017-05-31 ENCOUNTER — Ambulatory Visit (HOSPITAL_COMMUNITY)
Admission: EM | Admit: 2017-05-31 | Discharge: 2017-05-31 | Disposition: A | Discharge status: Home / Self Care | Payer: No Typology Code available for payment source | Attending: Internal Medicine | Admitting: Internal Medicine

## 2017-05-31 DIAGNOSIS — K047 Periapical abscess without sinus: Secondary | ICD-10-CM | POA: Diagnosis not present

## 2017-05-31 DIAGNOSIS — K0889 Other specified disorders of teeth and supporting structures: Secondary | ICD-10-CM | POA: Diagnosis not present

## 2017-05-31 MED ORDER — AMOXICILLIN-POT CLAVULANATE 875-125 MG PO TABS: 1.0000 | ORAL_TABLET | Freq: Two times a day (BID) | ORAL | 0 refills | Status: DC

## 2017-05-31 MED ORDER — LIDOCAINE HCL (PF) 2 % IJ SOLN
INTRAMUSCULAR | Status: AC
  Filled 2017-05-31: qty 2

## 2017-05-31 MED ORDER — CEFTRIAXONE SODIUM 1 G IJ SOLR
1.0000 g | Freq: Once | INTRAMUSCULAR | Status: AC
  Administered 2017-05-31: 1 g via INTRAMUSCULAR

## 2017-05-31 MED ORDER — BENZOCAINE 10 % MT GEL: 1.0000 "application " | OROMUCOSAL | 0 refills | Status: DC | PRN

## 2017-05-31 MED ORDER — CEFTRIAXONE SODIUM 1 G IJ SOLR
INTRAMUSCULAR | Status: AC
Start: 2017-05-31 — End: 2017-05-31
  Filled 2017-05-31: qty 10

## 2017-05-31 MED ORDER — NAPROXEN 500 MG PO TABS: 500.0000 mg | ORAL_TABLET | Freq: Two times a day (BID) | ORAL | 0 refills | Status: DC

## 2017-05-31 NOTE — Discharge Instructions (Signed)
Start Augmentin as directed for dental abscess. Naproxen and oragel for pain. Follow up with dentist for further treatment and evaluation. If experiencing worsening swelling, spreading redness, increased warmth, fever/chills go to the emergency department for further evaluation.

## 2017-05-31 NOTE — ED Provider Notes (Signed)
[facility]    CSN: [id number] Arrival date & time: 05/31/17  1319     History   Chief Complaint Chief Complaint  Patient presents with  . Dental Problem    HPI Amanda Larson is a 51 y.o. female.   50 year old female with history of chronic back pain, depression comes in for exacerbation of dental pain. Patient has known cracked tooth/caries on that tooth for the past 10 months, states pain has been worsening since 2 months ago and has now been unbearable with facial swelling for the past few days. Denies fever, chills, night sweats. Denies trouble breathing, trouble swallowing, swelling of the throat. Patient states she takes tramadol for back pain, which helps with the dental pain initially, but has not been helping for the past few days. Has not taken anything else for the pain.      Past Medical History:  Diagnosis Date  . Bronchitis   . Chronic back pain   . Depression   . Osteoarthritis     Patient Active Problem List   Diagnosis Date Noted  . Depression 02/08/2017  . Chronic low back pain 02/08/2017  . Asthma 02/08/2017  . Psoriasis 02/08/2017  . Health care maintenance 02/08/2017    Past Surgical History:  Procedure Laterality Date  . CESAREAN SECTION    . CHOLECYSTECTOMY    . TUBAL LIGATION      OB History    No data available       Home Medications    Prior to Admission medications   Medication Sig Start Date End Date Taking? Authorizing Provider  albuterol (PROVENTIL HFA;VENTOLIN HFA) 108 (90 Base) MCG/ACT inhaler Inhale 2 puffs into the lungs every 6 (six) hours as needed for wheezing or shortness of breath. 04/15/17   Mayo, Allyn Kenner, MD  amoxicillin-clavulanate (AUGMENTIN) 875-125 MG tablet Take 1 tablet by mouth every 12 (twelve) hours. 05/31/17   Cathie Hoops, Rajinder Mesick V, PA-C  benzocaine (ORAJEL) 10 % mucosal gel Use as directed 1 application in the mouth or throat as needed for mouth pain. 05/31/17   Cathie Hoops, Tyreik Delahoussaye V, PA-C  citalopram (CELEXA) 20  MG tablet Take 1 tablet (20 mg total) by mouth daily. 04/15/17   Mayo, Allyn Kenner, MD  diclofenac (VOLTAREN) 75 MG EC tablet Take 1 tablet (75 mg total) by mouth 2 (two) times daily. 04/15/17   Mayo, Allyn Kenner, MD  naproxen (NAPROSYN) 500 MG tablet Take 1 tablet (500 mg total) by mouth 2 (two) times daily. 05/31/17   Cathie Hoops, Katelyne Galster V, PA-C  omeprazole (PRILOSEC) 20 MG capsule Take 1 capsule (20 mg total) by mouth daily. 04/15/17 05/15/17  Mayo, Allyn Kenner, MD  traMADol Janean Sark) 50 MG tablet TAKE 1 TABLET BY MOUTH TWICE DAILY 05/23/17   Mayo, Allyn Kenner, MD    Family History Family History  Problem Relation Age of Onset  . Cancer Father   . Heart disease Paternal Grandfather   . Stroke Paternal Grandfather   . Drug abuse Paternal Grandfather   . High Cholesterol Mother   . Diabetes Maternal Aunt   . Hypertension Maternal Aunt   . Diabetes Maternal Uncle     Social History Social History  Substance Use Topics  . Smoking status: Former Smoker    Quit date: 07/31/2007  . Smokeless tobacco: Never Used  . Alcohol use No     Allergies   Sulfa antibiotics and Clarithromycin   Review of Systems Review of Systems  Reason unable to perform ROS: See  HPI as above.     Physical Exam Triage Vital Signs ED Triage Vitals  Enc Vitals Group     BP 05/31/17 1442 (!) 162/96     Pulse Rate 05/31/17 1442 78     Resp 05/31/17 1442 18     Temp 05/31/17 1442 98.6 F (37 C)     Temp Source 05/31/17 1442 Oral     SpO2 05/31/17 1442 100 %     Weight --      Height --      Head Circumference --      Peak Flow --      Pain Score 05/31/17 1440 9     Pain Loc --      Pain Edu? --      Excl. in GC? --    No data found.   Updated Vital Signs BP (!) 162/96   Pulse 78   Temp 98.6 F (37 C) (Oral)   Resp 18   LMP 01/17/2014   SpO2 100%      Physical Exam  Constitutional: She is oriented to person, place, and time. She appears well-developed and well-nourished. No distress.  HENT:  Head:  Normocephalic and atraumatic.  Mouth/Throat: Uvula is midline, oropharynx is clear and moist and mucous membranes are normal.    Tender on palpation of tooth #7. Tenderness on palpation of the surrounding gums.  Mild subtle swelling of right upper cheek. No erythema, increased warmth seen.   Eyes: Pupils are equal, round, and reactive to light. Conjunctivae are normal.  Neurological: She is alert and oriented to person, place, and time.       UC Treatments / Results  Labs (all labs ordered are listed, but only abnormal results are displayed) Labs Reviewed - No data to display  EKG  EKG Interpretation None       Radiology No results found.  Procedures Procedures (including critical care time)  Medications Ordered in UC Medications  cefTRIAXone (ROCEPHIN) injection 1 g (1 g Intramuscular Given 05/31/17 1546)     Initial Impression / Assessment and Plan / UC Course  I have reviewed the triage vital signs and the nursing notes.  Pertinent labs & imaging results that were available during my care of the patient were reviewed by me and considered in my medical decision making (see chart for details).     Rocephin injection in office. Start Augmentin as directed for dental abscess. Naproxen/oragel for pain. Discussed with patient symptoms can return if dental problem is not addressed. Follow up with dentist for further evaluation and treatment of dental pain. Resources given. Patient to monitor for increase swelling, spreading erythema, increased warmth, to go to the emergency department for further evaluation.  Case discussed with Dr Dayton Scrape, who agrees to plan.   Final Clinical Impressions(s) / UC Diagnoses   Final diagnoses:  Pain, dental  Dental abscess    New Prescriptions Discharge Medication List as of 05/31/2017  3:32 PM    START taking these medications   Details  amoxicillin-clavulanate (AUGMENTIN) 875-125 MG tablet Take 1 tablet by mouth every 12 (twelve)  hours., Starting Fri 05/31/2017, Normal    benzocaine (ORAJEL) 10 % mucosal gel Use as directed 1 application in the mouth or throat as needed for mouth pain., Starting Fri 05/31/2017, Normal    naproxen (NAPROSYN) 500 MG tablet Take 1 tablet (500 mg total) by mouth 2 (two) times daily., Starting Fri 05/31/2017, Normal          Cathie Hoops,  Joniah Bednarski V, PA-C 05/31/17 1606

## 2017-05-31 NOTE — ED Triage Notes (Signed)
Pt  Reports   Upper   Front  Tooth   For   sev  Months      Worse     X  sev  Days  Pain  Radiating to  r  Side  Of face

## 2017-07-21 ENCOUNTER — Other Ambulatory Visit: Payer: Self-pay | Admitting: Internal Medicine

## 2017-07-22 NOTE — Telephone Encounter (Signed)
Please let patient know that her prescription is at the front desk for her to pick up. Thanks!

## 2017-07-22 NOTE — Telephone Encounter (Signed)
Pt contacted and informed of rx ready for pick up.   

## 2017-07-24 ENCOUNTER — Telehealth: Payer: Self-pay | Admitting: Internal Medicine

## 2017-08-15 ENCOUNTER — Other Ambulatory Visit: Payer: Self-pay | Admitting: Internal Medicine

## 2017-11-11 ENCOUNTER — Ambulatory Visit (INDEPENDENT_AMBULATORY_CARE_PROVIDER_SITE_OTHER): Payer: PRIVATE HEALTH INSURANCE | Admitting: Student

## 2017-11-11 ENCOUNTER — Other Ambulatory Visit: Payer: Self-pay

## 2017-11-11 ENCOUNTER — Encounter: Payer: Self-pay | Admitting: Student

## 2017-11-11 VITALS — BP 110/94 | HR 60 | Temp 97.7°F | Ht 61.5 in | Wt 320.8 lb

## 2017-11-11 DIAGNOSIS — S8992XA Unspecified injury of left lower leg, initial encounter: Secondary | ICD-10-CM

## 2017-11-11 NOTE — Progress Notes (Signed)
  Subjective:    Foye ClockKristina is a 52 y.o. old female here SDA for left knee pain.  HPI Left knee pain: she walking down a stair from her gazebo on the porch back to her house yesterday evening when she heard a cracking sound in her left knee. Denies fall or near fall. She was able to walk on it with assistance right after the incident but hard to bend her left knee due to pain. Denies swelling or redness. No pain at rest or with stepping on it as long as she doesn't bend her knee. No prior history of any trauma, injury or surgery. Overall, pain has improved significantly.  PMH/Problem List: has Depression; Chronic low back pain; Asthma; Psoriasis; and Health care maintenance on their problem list.   has a past medical history of Bronchitis, Chronic back pain, Depression, and Osteoarthritis.  FH:  Family History  Problem Relation Age of Onset  . Cancer Father   . Heart disease Paternal Grandfather   . Stroke Paternal Grandfather   . Drug abuse Paternal Grandfather   . High Cholesterol Mother   . Diabetes Maternal Aunt   . Hypertension Maternal Aunt   . Diabetes Maternal Uncle     SH Social History   Tobacco Use  . Smoking status: Former Smoker    Last attempt to quit: 07/31/2007    Years since quitting: 10.2  . Smokeless tobacco: Never Used  Substance Use Topics  . Alcohol use: No  . Drug use: No    Review of Systems Review of systems negative except for pertinent positives and negatives in history of present illness above.     Objective:     Vitals:   11/11/17 1124  BP: (!) 110/94  Pulse: 60  Temp: 97.7 F (36.5 C)  TempSrc: Oral  SpO2: 98%  Weight: (!) 320 lb 12.8 oz (145.5 kg)  Height: 5' 1.5" (1.562 m)   Body mass index is 59.63 kg/m.  Physical Exam  GEN: Morbidly obese, appears well, no apparent distress. CVS: RRR, nl s1 & s2, no murmurs, no edema, RESP: no IWOB MSK:  Left knee Normal to inspection with no erythema or effusion or obvious bony  abnormalities. Appears symmetric to the right knee. No warmth to touch. Has joint line tenderness Limited flexion due to pain. Ligaments with solid consistent endpoints Non painful patellar compression. Not able to perform McMurray or Apley maneuver due to pain  Normal sensation in right leg, foot.   Neurovascularly intact with good distal pulses.  PSYCH: euthymic mood with congruent affect    Assessment and Plan:  1. Injury of left knee, initial encounter: likely meniscal tear.  Exam with limited left knee flexion due to pain. Otherwise, within normal limits.  No significant swelling, erythema or increased warmth to try to think of ligamental injury or infectious etiology.  Low suspicion for fracture but cannot rule out.  Overall, patient's pain has improved tremendously.  He has diclofenac and tramadol at home.  I recommended ice compression for 10-15 minutes 3-4 times a day.  I also recommended flexion and extension with pain as a guide.  Will obtain DG Lt knee to exclude bone fracture.  - DG KNEE 3 VIEW LEFT; Future - Out of work for 1 week until she follows up in clinic  Return in about 1 week (around 11/18/2017) for Left knee pain.  Almon Herculesaye T Aaren Atallah, MD 11/11/17 Pager: [phone]

## 2017-11-11 NOTE — Patient Instructions (Signed)
It was great seeing you today! We have addressed the following issues today  Left knee pain: I think he tore you meniscus as we discussed in the office.  This may improve with pain medication, rest and ice compression as we discussed.  I recommend ice compression for 10-15 minutes 3-4 times a day.  I also recommend moving to knee (extending and flexing) with pain as you guide.  We also getting an x-ray of your knee to exclude any bone fracture.  You can get this done at [facility] today.  If we did any lab work today, and the results require attention, either me or my nurse will get in touch with you. If everything is normal, you will get a letter in mail and a message via . If you don't hear from us in two weeks, please give us a call. Otherwise, we look forward to seeing you again at your next visit. If you have any questions or concerns before then, please call the clinic at [phone].  Please bring all your medications to every doctors visit  Sign up for My Chart to have easy access to your labs results, and communication with your Primary care physician.    Please check-out at the front desk before leaving the clinic.    Take Care,   Dr. Alanda SlimGonfa   Meniscus Tear A meniscus tear is a knee injury in which a piece of the meniscus is torn. The meniscus is a thick, rubbery, wedge-shaped cartilage in the knee. Two menisci are located in each knee. They sit between the upper bone (femur) and lower bone (tibia) that make up the knee joint. Each meniscus acts as a shock absorber for the knee. A torn meniscus is one of the most common types of knee injuries. This injury can range from mild to severe. Surgery may be needed for a severe tear. What are the causes? This injury may be caused by any squatting, twisting, or pivoting movement. Sports-related injuries are the most common cause. These often occur from:  Running and stopping suddenly.  Changing direction.  Being tackled or  knocked off your feet.  As people get older, their meniscus gets thinner and weaker. In these people, tears can happen more easily, such as from climbing stairs. What increases the risk? This injury is more likely to happen to:  People who play contact sports.  Males.  People who are 5830?53 years of age.  What are the signs or symptoms? Symptoms of this injury include:  Knee pain, especially at the side of the knee joint. You may feel pain when the injury occurs, or you may only hear a pop and feel pain later.  A feeling that your knee is clicking, catching, locking, or giving way.  Not being able to fully bend or extend your knee.  Bruising or swelling in your knee.  How is this diagnosed? This injury may be diagnosed based on your symptoms and a physical exam. The physical exam may include:  Moving your knee in different ways.  Feeling for tenderness.  Listening for a clicking sound.  Checking if your knee locks or catches.  You may also have tests, such as:  X-rays.  MRI.  A procedure to look inside your knee with a narrow surgical telescope (arthroscopy).  You may be referred to a knee specialist (orthopedic surgeon). How is this treated? Treatment for this injury depends on the severity of the tear. Treatment for a mild tear may include:  Rest.  Medicine to reduce pain and swelling. This is usually a nonsteroidal anti-inflammatory drug (NSAID).  A knee brace or an elastic sleeve or wrap.  Using crutches or a walker to keep weight off your knee and to help you walk.  Exercises to strengthen your knee (physical therapy).  You may need surgery if you have a severe tear or if other treatments are not working. Follow these instructions at home: Managing pain and swelling  Take over-the-counter and prescription medicines only as told by your health care provider.  If directed, apply ice to the injured area: ? Put ice in a plastic bag. ? Place a towel  between your skin and the bag. ? Leave the ice on for 20 minutes, 2-3 times per day.  Raise (elevate) the injured area above the level of your heart while you are sitting or lying down. Activity  Do not use the injured limb to support your body weight until your health care provider says that you can. Use crutches or a walker as told by your health care provider.  Return to your normal activities as told by your health care provider. Ask your health care provider what activities are safe for you.  Perform range-of-motion exercises only as told by your health care provider.  Begin doing exercises to strengthen your knee and leg muscles only as told by your health care provider. After you recover, your health care provider may recommend these exercises to help prevent another injury. General instructions  Use a knee brace or elastic wrap as told by your health care provider.  Keep all follow-up visits as told by your health care provider. This is important. Contact a health care provider if:  You have a fever.  Your knee becomes red, tender, or swollen.  Your pain medicine is not helping.  Your symptoms get worse or do not improve after 2 weeks of home care. This information is not intended to replace advice given to you by your health care provider. Make sure you discuss any questions you have with your health care provider. Document Released: 11/17/2002 Document Revised: 02/02/2016 Document Reviewed: 12/20/2014 Elsevier Interactive Patient Education  [employer].

## 2017-11-12 ENCOUNTER — Encounter: Payer: Self-pay | Admitting: Student

## 2017-11-12 ENCOUNTER — Ambulatory Visit (HOSPITAL_COMMUNITY)
Admission: RE | Admit: 2017-11-12 | Discharge: 2017-11-12 | Disposition: A | Discharge status: Home / Self Care | Payer: PRIVATE HEALTH INSURANCE | Source: Ambulatory Visit | Attending: Family Medicine | Admitting: Family Medicine

## 2017-11-12 DIAGNOSIS — S8992XA Unspecified injury of left lower leg, initial encounter: Secondary | ICD-10-CM | POA: Diagnosis present

## 2017-11-12 DIAGNOSIS — X58XXXA Exposure to other specified factors, initial encounter: Secondary | ICD-10-CM | POA: Insufficient documentation

## 2017-11-18 ENCOUNTER — Encounter: Payer: Self-pay | Admitting: Student

## 2017-11-18 ENCOUNTER — Other Ambulatory Visit: Payer: Self-pay

## 2017-11-18 ENCOUNTER — Ambulatory Visit (INDEPENDENT_AMBULATORY_CARE_PROVIDER_SITE_OTHER): Payer: PRIVATE HEALTH INSURANCE | Admitting: Student

## 2017-11-18 VITALS — BP 120/92 | HR 81 | Temp 98.2°F | Ht 62.0 in | Wt 317.6 lb

## 2017-11-18 DIAGNOSIS — R7309 Other abnormal glucose: Secondary | ICD-10-CM | POA: Diagnosis not present

## 2017-11-18 DIAGNOSIS — E1165 Type 2 diabetes mellitus with hyperglycemia: Secondary | ICD-10-CM | POA: Insufficient documentation

## 2017-11-18 DIAGNOSIS — S8992XD Unspecified injury of left lower leg, subsequent encounter: Secondary | ICD-10-CM | POA: Diagnosis not present

## 2017-11-18 DIAGNOSIS — E119 Type 2 diabetes mellitus without complications: Secondary | ICD-10-CM

## 2017-11-18 DIAGNOSIS — M25562 Pain in left knee: Secondary | ICD-10-CM | POA: Diagnosis not present

## 2017-11-18 LAB — POCT GLYCOSYLATED HEMOGLOBIN (HGB A1C): Hemoglobin A1C: 8

## 2017-11-18 NOTE — Patient Instructions (Addendum)
It was great seeing you today! We have addressed the following issues today  Left knee pain: Your x-ray did not show any fracture or dislocation.  You could still have some extent of meniscal injury with underlying osteoarthritis.  Continue using the diclofenac and tramadol.  I also recommend using extra strength Tylenol every 6 hours.  Continue ice compression 3-4 times a day.  We sent a referral to physical therapy as well.  Someone will get in touch with you about this in the next couple of weeks. Try getting in water as we discussed.  I recommend follow-up with your primary care doctor in a month or sooner if needed.   Diabetes: You A1c is 8.0%.  That shows that you have diabetes.  Please follow-up with your primary care doctor on this as soon as possible.  If we did any lab work today, and the results require attention, either me or my nurse will get in touch with you. If everything is normal, you will get a letter in mail and a message via . If you don't hear from us in two weeks, please give us a call. Otherwise, we look forward to seeing you again at your next visit. If you have any questions or concerns before then, please call the clinic at [phone].  Please bring all your medications to every doctors visit  Sign up for My Chart to have easy access to your labs results, and communication with your Primary care physician.    Please check-out at the front desk before leaving the clinic.    Take Care,   Dr. Alanda SlimGonfa

## 2017-11-18 NOTE — Progress Notes (Signed)
Subjective:    Amanda Larson is a 52 y.o. old female here for follow-up on left knee pain  HPI Left knee pain: Was seen for the same issue about a week ago.  Today, she reports some improvement.  She continues to have pain with weightbearing.  She also reports pain with lying down on her back, likely due to overextension. Improves with lying on her side. She feels good in the morning and bad after walking on it. No swelling, skin change or fever.  X-ray of her knee without fracture, dislocation or effusion.  There is report of mild patellofemoral compartment joint space narrowing.  She brought FMLA paperwork to be filled.  She says her work involves a lot of standing and walking.  She thinks she cannot do this with the pain she has. She also doesn't think she will get an accomodation.   PMH/Problem List: has Depression; Chronic low back pain; Asthma; Psoriasis; and Health care maintenance on their problem list.   has a past medical history of Bronchitis, Chronic back pain, Depression, and Osteoarthritis.  FH:  Family History  Problem Relation Age of Onset  . Cancer Father   . Heart disease Paternal Grandfather   . Stroke Paternal Grandfather   . Drug abuse Paternal Grandfather   . High Cholesterol Mother   . Diabetes Maternal Aunt   . Hypertension Maternal Aunt   . Diabetes Maternal Uncle     SH Social History   Tobacco Use  . Smoking status: Former Smoker    Last attempt to quit: 07/31/2007    Years since quitting: 10.3  . Smokeless tobacco: Never Used  Substance Use Topics  . Alcohol use: No  . Drug use: No    Review of Systems Review of systems negative except for pertinent positives and negatives in history of present illness above.     Objective:     Vitals:   11/18/17 1006  BP: (!) 120/92  Pulse: 81  Temp: 98.2 F (36.8 C)  TempSrc: Oral  SpO2: 94%  Weight: (!) 317 lb 9.6 oz (144.1 kg)  Height: 5\' 2"  (1.575 m)   Body mass index is 58.09 kg/m.  Physical  Exam  GEN: morbid obesity, appears well, no apparent distress.  HEM: negative for cervical or periauricular lymphadenopathies CVS: RRR, nl s1 & s2, no murmurs, no edema RESP: no IWOB MSK: Left knee Normal to inspection with no erythema or effusion or obvious bony abnormalities.  She has psoriasis on left leg from knee down.  Appears symmetric to the right knee. No warmth to touch. Has joint line tenderness medially.  Mild tenderness over popliteal area. Improved flexion (~120 degrees) Ligaments with solid consistent endpoints Non painful patellar compression. Not able to perform McMurray or Apley maneuver due to pain            Normal sensation in right leg, foot.              Neurovascularly intact with good distal pulses.  SKIN: Psoriasis on left leg below her knee NEURO: alert and oiented appropriately, no gross deficits  PSYCH: euthymic mood with congruent affect    Assessment and Plan:  1. Injury of left knee, subsequent encounter: Reports some improvement.  She has better range of flexion in her left knee than when I saw her about a week ago.  Mild tenderness to palpation over the medial joint line and posteriorly.  Ligaments intact. X-ray of her knee without fracture, dislocation or effusion.  There  is report of mild patellofemoral compartment joint space narrowing.  We will continue conservative management with pain medication and ice compression.  She is interested in physical therapy.  Referral ordered.  She brought FMLA paper to be filled.  This will be completed in the next 2 days. - Ambulatory referral to Physical Therapy  2.  Diabetes: Denies history of this.  A1c 8.0% today.  A1c was 7.6% about 7 months ago.  Not interested in starting metformin today.  Recommended follow-up with PCP on this.  Return in about 1 month (around 12/19/2017) for Left knee pain and diabetes.  Almon Herculesaye T Bellah Alia, MD 11/18/17 Pager: [phone]

## 2017-11-19 ENCOUNTER — Telehealth: Payer: Self-pay | Admitting: Student

## 2017-11-19 NOTE — Telephone Encounter (Signed)
FMLA paper completed and left at front desk for patient to pick up. Called and left voice mail for patient as well.   Almon Herculesaye T Gonfa, MD

## 2017-11-22 ENCOUNTER — Other Ambulatory Visit: Payer: Self-pay | Admitting: Internal Medicine

## 2017-11-22 MED ORDER — DICLOFENAC SODIUM 75 MG PO TBEC: 75.0000 mg | DELAYED_RELEASE_TABLET | Freq: Two times a day (BID) | ORAL | 0 refills | Status: DC

## 2017-11-22 MED ORDER — TRAMADOL HCL 50 MG PO TABS: 50.0000 mg | ORAL_TABLET | Freq: Two times a day (BID) | ORAL | 0 refills | Status: DC

## 2017-11-22 NOTE — Telephone Encounter (Signed)
Needs refills on generic voltran and tramadol. Pharmacy is telling pt [facility] is not responding to their refill requests.  [facility] [address]

## 2017-11-28 ENCOUNTER — Other Ambulatory Visit: Payer: Self-pay | Admitting: *Deleted

## 2017-11-28 ENCOUNTER — Other Ambulatory Visit: Payer: Self-pay

## 2017-11-28 ENCOUNTER — Ambulatory Visit: Payer: No Typology Code available for payment source | Attending: Family Medicine | Admitting: Physical Therapy

## 2017-11-28 ENCOUNTER — Encounter: Payer: Self-pay | Admitting: Physical Therapy

## 2017-11-28 DIAGNOSIS — M25562 Pain in left knee: Secondary | ICD-10-CM | POA: Diagnosis not present

## 2017-11-28 DIAGNOSIS — M6281 Muscle weakness (generalized): Secondary | ICD-10-CM | POA: Diagnosis present

## 2017-11-28 NOTE — Therapy (Signed)
[facility] [address] [place], [place], [address] Phone: [phone]   Fax:  [phone]  Physical Therapy Evaluation  Patient Details  Name: Amanda Larson MRN: [id number] Date of Birth: 1966/03/11 Referring Provider: Almon Hercules, MD   Encounter Date: 11/28/2017  PT End of Session - 11/28/17 1422    Visit Number  1    Number of Visits  9    Date for PT Re-Evaluation  12/27/17    Authorization Type  self pay    PT Start Time  1415    PT Stop Time  1500    PT Time Calculation (min)  45 min    Activity Tolerance  Patient tolerated treatment well    Behavior During Therapy  [facility] for tasks assessed/performed       Past Medical History:  Diagnosis Date  . Bronchitis   . Chronic back pain   . Depression   . Osteoarthritis     Past Surgical History:  Procedure Laterality Date  . CESAREAN SECTION    . CHOLECYSTECTOMY    . TUBAL LIGATION      There were no vitals filed for this visit.   Subjective Assessment - 11/28/17 1422    Subjective  been going on for about 2 and a half weeks. husband built gazebo with stairs and when stepped down her knee made a noise. unable to walk after her knee made the noise. walked stiff legged, super stiff though and can't sleep with it straight. hasn't happened before,  works with [employer], slipped and leg went down on grate last year. have to bend or turn on L side to sleep, wakes up sometimes. can't get up and down stairs, no stairs at home, can't walk for more than about 10-15 minutes. when meds ran out couldn't do anything. feels like a pulling pain in the posterior aspect. [employer] not working right now, requires a lot of transporting and getting in and out. can't do a lot of bending or clean the house for long, sometimes it gets stiff, no falls in the past 6 months.     Patient Stated Goals  would like to walk dogs    Currently in Pain?  Yes    Pain Score  4     Pain Location  Knee    Pain Orientation  Left    Pain Descriptors / Indicators  Throbbing;Tightness         [facility] PT Assessment - 11/28/17 0001      Assessment   Medical Diagnosis  Lt knee pain    Referring Provider  Almon Hercules, MD    Prior Therapy  no      Precautions   Precautions  None      Restrictions   Weight Bearing Restrictions  No      Balance Screen   Has the patient fallen in the past 6 months  No      Home Environment   Living Environment  Private residence    Living Arrangements  Spouse/significant other    Additional Comments  no stairs      Prior Function   Level of Independence  Independent    Vocation Requirements  working with mentally disabled clients- feeding, transferring, etc...      Cognition   Overall Cognitive Status  Within Functional Limits for tasks assessed      Observation/Other Assessments   Focus on Therapeutic Outcomes (FOTO)   59%  disability      Sensation   Additional Comments  WFL      ROM / Strength   AROM / PROM / Strength  AROM;Strength      AROM   AROM Assessment Site  Knee    Right/Left Knee  Left    Left Knee Extension  12 hyperextension    Left Knee Flexion  83      Strength   Strength Assessment Site  Knee    Right/Left Knee  Left;Right    Right Knee Flexion  4/5    Right Knee Extension  5/5    Left Knee Flexion  3+/5 Concordant pain noted    Left Knee Extension  3+/5 pain      Palpation   Palpation comment  TTP at medial hamstrings proximal to knee joint             Objective measurements completed on examination: See above findings.      [facility] Adult PT Treatment/Exercise - 11/28/17 0001      Exercises   Exercises  Knee/Hip      Pilates   Pilates Reformer  3      Knee/Hip Exercises: Stretches   Active Hamstring Stretch  -- 5 reps    Active Hamstring Stretch Limitations  seated      Knee/Hip Exercises: Supine   Straight Leg Raises  10 reps      Knee/Hip Exercises: Sidelying   Hip  ABduction  10 reps      Manual Therapy   Manual Therapy  Soft tissue mobilization    Soft tissue mobilization  small wooden T band roller on distal medial hamstrings             PT Education - 11/28/17 1517    Education provided  Yes    Education Details  Anatomy of condition, HEP, POC, exercise form/rationale, FOTO    Person(s) Educated  Patient    Methods  Explanation;Handout;Demonstration;Tactile cues;Verbal cues    Comprehension  Verbalized understanding;Returned demonstration;Verbal cues required;Tactile cues required;Need further instruction       PT Short Term Goals - 11/28/17 1509      PT SHORT TERM GOAL #1   Title  Pt will be compliant with basic HEP in 2 weeks    Baseline  HEP given today    Time  2    Period  Weeks    Status  New      PT SHORT TERM GOAL #2   Title  Pt will have 100 degrees L knee flexion ROM in 2 weeks    Baseline  Pt has 83 degrees L knee flexion    Time  2    Period  Weeks    Status  New        PT Long Term Goals - 11/28/17 1510      PT LONG TERM GOAL #1   Title  Pt will be able to return to work and perform all required duties independently without an increase in pain in 6 weeks    Baseline  Pt currently unable to work     Time  6    Period  Weeks    Status  New      PT LONG TERM GOAL #2   Title  Pt will be able to walk her dogs for 30 minutes without an increase in pain in 6 weeks    Baseline  Currently only able to walk for 10 minutes without an increase  in pain.     Time  6    Period  Weeks      PT LONG TERM GOAL #3   Title  Pt will be able to perform all household activities independently without pain in 6 weeks    Baseline  Pt unable to perform all ADL's independently due to pain    Time  6    Period  Weeks    Status  New      PT LONG TERM GOAL #4   Title  Pt will improve her FOTO score to 40% disabilty in 6 weeks    Baseline  Pt's current FOTO score shows 59% disability    Time  6    Period  Weeks    Status  New              Plan - 11/28/17 1503    Clinical Impression Statement  Pt presents today with L knee pain after stepping down from step of gazebo when she heard a "crackling" causing pain 2.5 weeks ago. It is worse with walking, laying down, and going up and down stairs. She has decreased flexion in her knee, with hyperextension noted. She also has decreased strength in the L LE. Pt was tender to palpation of the medial hamstrings. Pt will benefit from PT to increase strength and ROM, allow her to return to work and complete ADL's and increase QOL.     Clinical Presentation  Stable    Clinical Decision Making  Low    Rehab Potential  Fair    PT Frequency  2x / week    PT Duration  6 weeks    PT Treatment/Interventions  ADLs/Self Care Home Management;Cryotherapy;Electrical Stimulation;Iontophoresis 4mg /ml Dexamethasone;Moist Heat;Traction;Ultrasound;Gait training;Stair training;Functional mobility training;Therapeutic activities;Therapeutic exercise;Balance training;Neuromuscular re-education;Patient/family education;Manual techniques;Passive range of motion;Dry needling;Taping    PT Next Visit Plan  Reassess hamstring tenderness, reiterate HEP, knee ROM exercises,     PT Home Exercise Plan  supine SLR, sidelying hip abduction, seated active hamstring stretch    Consulted and Agree with Plan of Care  Patient       Patient will benefit from skilled therapeutic intervention in order to improve the following deficits and impairments:  Abnormal gait, Improper body mechanics, Pain, Decreased mobility, Decreased activity tolerance, Decreased endurance, Decreased range of motion, Decreased strength, Hypomobility, Obesity, Difficulty walking, Decreased balance  Visit Diagnosis: Acute pain of left knee - Plan: PT plan of care cert/re-cert  Muscle weakness (generalized) - Plan: PT plan of care cert/re-cert     Problem List Patient Active Problem List   Diagnosis Date Noted  . Diabetes (HCC)  11/18/2017  . Depression 02/08/2017  . Chronic low back pain 02/08/2017  . Asthma 02/08/2017  . Psoriasis 02/08/2017  . Health care maintenance 02/08/2017   Karlyn Glasco C. Azaria Stegman PT, DPT 11/28/17 5:11 PM   [facility] [address] [place], [place], [address] Phone: [phone]   Fax:  [phone]  Name: Amanda Larson MRN: [id number] Date of Birth: 10-03-1965

## 2017-11-29 MED ORDER — CITALOPRAM HYDROBROMIDE 20 MG PO TABS: 20.0000 mg | ORAL_TABLET | Freq: Every day | ORAL | 0 refills | Status: DC

## 2017-12-09 ENCOUNTER — Ambulatory Visit (INDEPENDENT_AMBULATORY_CARE_PROVIDER_SITE_OTHER): Payer: PRIVATE HEALTH INSURANCE | Admitting: Internal Medicine

## 2017-12-09 ENCOUNTER — Other Ambulatory Visit: Payer: Self-pay

## 2017-12-09 ENCOUNTER — Encounter: Payer: Self-pay | Admitting: Internal Medicine

## 2017-12-09 VITALS — BP 130/74 | HR 98 | Temp 98.2°F | Wt 322.0 lb

## 2017-12-09 DIAGNOSIS — Z1239 Encounter for other screening for malignant neoplasm of breast: Secondary | ICD-10-CM

## 2017-12-09 DIAGNOSIS — E785 Hyperlipidemia, unspecified: Secondary | ICD-10-CM | POA: Diagnosis not present

## 2017-12-09 DIAGNOSIS — Z1231 Encounter for screening mammogram for malignant neoplasm of breast: Secondary | ICD-10-CM

## 2017-12-09 DIAGNOSIS — E118 Type 2 diabetes mellitus with unspecified complications: Secondary | ICD-10-CM | POA: Diagnosis not present

## 2017-12-09 DIAGNOSIS — Z0000 Encounter for general adult medical examination without abnormal findings: Secondary | ICD-10-CM | POA: Diagnosis not present

## 2017-12-09 LAB — POCT UA - MICROALBUMIN
Creatinine, POC: 100 mg/dL
Microalbumin Ur, POC: 30 mg/L

## 2017-12-09 MED ORDER — OMEPRAZOLE 20 MG PO CPDR: 20.0000 mg | DELAYED_RELEASE_CAPSULE | Freq: Every day | ORAL | 0 refills | Status: DC

## 2017-12-09 MED ORDER — ATORVASTATIN CALCIUM 40 MG PO TABS: 40.0000 mg | ORAL_TABLET | Freq: Every day | ORAL | 0 refills | Status: DC

## 2017-12-09 MED ORDER — METFORMIN HCL 500 MG PO TABS: 500.0000 mg | ORAL_TABLET | Freq: Two times a day (BID) | ORAL | 0 refills | Status: DC

## 2017-12-09 NOTE — Patient Instructions (Addendum)
It was so nice to see you!  For your diabetes- I have started a medication called Metformin. Please take 1 tablet twice a day with meals. Let me know if you notice any side effects from this.   For your cholesterol- I have started a medication called Lipitor. Please take 1 tablet daily at night.  I have ordered a mammogram. You should get a call from [facility] to get this scheduled.  I have also sent in a referral to the ophthalmologist (eye doctor). You should hear from our office in the next 2 weeks to schedule this.  We will see you back in June to recheck your A1c.  -Dr. Nancy MarusMayo

## 2017-12-09 NOTE — Progress Notes (Signed)
   [facility] Phone: [phone]  Subjective:  Amanda Larson is a 52 year old female presenting to clinic for follow-up of her diabetes and HLD.  T2DM: Diagnosed in August 2018 with an A1c 7.6%. Has been checking her blood sugars every morning. Have been in ranging from 121-280. Not taking any medications. Endorses occasional polydipsia. Denies polyuria. Has been checking her CBGs in the morning using her husband's meter. Have been ranging from 121-280.  HLD: Had a lipid panel 04/2017 that showed elevated LDL to 104. Also with new diagnosis of diabetes. Not on a statin. States she walks her dog every day to try to get her cholesterol down, but this hasn't helped. No chest pain.  ROS: See HPI for pertinent positives and negatives  Past Medical History- asthma, T2DM, psoriasis, chronic low back pain, depression, HLD  Family history reviewed for today's visit. No changes.  Social history- patient is a former smoker  Objective: BP 130/74   Pulse 98   Temp 98.2 F (36.8 C) (Oral)   Wt (!) 322 lb (146.1 kg)   LMP 01/17/2014   SpO2 96%   BMI 58.89 kg/m  Gen: NAD, alert, cooperative with exam HEENT: NCAT, EOMI, MMM Neck: FROM, supple CV: RRR, no murmur Resp: CTABL, no wheezes, normal work of breathing GI: SNTND, BS present, no guarding or organomegaly Msk: No edema, warm, normal tone, moves UE/LE spontaneously Foot: No deformities, no ulcerations, no other skin breakdown bilaterally; intact to touch and monofilament testing bilaterally; PT and DP pulses intact bilaterally. Neuro: Alert and oriented, no gross deficits  Assessment/Plan: T2DM: Uncontrolled. New diagnosis 04/2017. A1c was 8.0 today. Goal <7.0. Not currently on any medications. - Start Metformin 500mg  bid - Diabetic foot exam performed today - Urine microalbumin performed today - Referral placed to ophthalmology for annual diabetic eye exam - Follow-up in 3 months for recheck of  A1c  HLD: Uncontrolled. Last lipid panel 04/2017 with chol 200, HDL 30, LDL 104, and TG 332. Not currently on a statin. - Start Lipitor 40mg  daily due to LDL 104 and new diagnosis of diabetes - Follow-up in 3 months  Healthcare Maintenance: - Order placed for mammogram   Willadean CarolKaty Mayo, MD PGY-3

## 2017-12-10 ENCOUNTER — Ambulatory Visit: Payer: PRIVATE HEALTH INSURANCE | Attending: Family Medicine | Admitting: Physical Therapy

## 2017-12-10 ENCOUNTER — Encounter: Payer: Self-pay | Admitting: Physical Therapy

## 2017-12-10 DIAGNOSIS — M25562 Pain in left knee: Secondary | ICD-10-CM | POA: Insufficient documentation

## 2017-12-10 DIAGNOSIS — E785 Hyperlipidemia, unspecified: Secondary | ICD-10-CM | POA: Insufficient documentation

## 2017-12-10 DIAGNOSIS — M6281 Muscle weakness (generalized): Secondary | ICD-10-CM | POA: Insufficient documentation

## 2017-12-10 NOTE — Assessment & Plan Note (Signed)
Uncontrolled. New diagnosis 04/2017. A1c was 8.0 today. Goal <7.0. Not currently on any medications. - Start Metformin 500mg  bid - Diabetic foot exam performed today - Urine microalbumin performed today - Referral placed to ophthalmology for annual diabetic eye exam - Follow-up in 3 months for recheck of A1c

## 2017-12-10 NOTE — Assessment & Plan Note (Signed)
Uncontrolled. Last lipid panel 04/2017 with chol 200, HDL 30, LDL 104, and TG 332. Not currently on a statin. - Start Lipitor 40mg  daily due to LDL 104 and new diagnosis of diabetes - Follow-up in 3 months

## 2017-12-10 NOTE — Therapy (Signed)
[facility] [address] [place], [place], [address] Phone: [phone]   Fax:  [phone]  Physical Therapy Treatment  Patient Details  Name: Amanda GrapesKristina M Larson MRN: [id number] Date of Birth: November 06, 1965 Referring Provider: Almon HerculesGonfa, Taye T, MD   Encounter Date: 12/10/2017  PT End of Session - 12/10/17 1420    Visit Number  2    Number of Visits  9    Date for PT Re-Evaluation  12/27/17    Authorization Type  self pay    PT Start Time  0215    PT Stop Time  0256    PT Time Calculation (min)  41 min       Past Medical History:  Diagnosis Date  . Bronchitis   . Chronic back pain   . Depression   . Osteoarthritis     Past Surgical History:  Procedure Laterality Date  . CESAREAN SECTION    . CHOLECYSTECTOMY    . TUBAL LIGATION      There were no vitals filed for this visit.  Subjective Assessment - 12/10/17 1418    Subjective  7/10 with standing too long.     Currently in Pain?  Yes    Pain Score  7     Pain Descriptors / Indicators  Throbbing;Sore hard pulling                        [facility] Adult PT Treatment/Exercise - 12/10/17 0001      Knee/Hip Exercises: Stretches   Passive Hamstring Stretch  3 reps;30 seconds      Knee/Hip Exercises: Supine   Heel Slides  10 reps x 3 with stap assist 10 sec each     Straight Leg Raises  2 sets;10 reps      Knee/Hip Exercises: Sidelying   Hip ABduction  2 sets;10 reps      Modalities   Modalities  Ultrasound      Ultrasound   Ultrasound Location  medial hamstring    Ultrasound Parameters  100% 1.2 w/cm2 1 mhz wand x 8 min     Ultrasound Goals  Pain      Manual Therapy   Soft tissue mobilization  medial hamstring , distal                PT Short Term Goals - 11/28/17 1509      PT SHORT TERM GOAL #1   Title  Pt will be compliant with basic HEP in 2 weeks    Baseline  HEP given today    Time  2    Period  Weeks    Status  New      PT  SHORT TERM GOAL #2   Title  Pt will have 100 degrees L knee flexion ROM in 2 weeks    Baseline  Pt has 83 degrees L knee flexion    Time  2    Period  Weeks    Status  New        PT Long Term Goals - 11/28/17 1510      PT LONG TERM GOAL #1   Title  Pt will be able to return to work and perform all required duties independently without an increase in pain in 6 weeks    Baseline  Pt currently unable to work     Time  6    Period  Weeks    Status  New  PT LONG TERM GOAL #2   Title  Pt will be able to walk her dogs for 30 minutes without an increase in pain in 6 weeks    Baseline  Currently only able to walk for 10 minutes without an increase in pain.     Time  6    Period  Weeks      PT LONG TERM GOAL #3   Title  Pt will be able to perform all household activities independently without pain in 6 weeks    Baseline  Pt unable to perform all ADL's independently due to pain    Time  6    Period  Weeks    Status  New      PT LONG TERM GOAL #4   Title  Pt will improve her FOTO score to 40% disabilty in 6 weeks    Baseline  Pt's current FOTO score shows 59% disability    Time  6    Period  Weeks    Status  New            Plan - 12/10/17 1457    Clinical Impression Statement  Pt arrives reporting 7/10 pain posteriomedial knee/hamstring. Reviewed HEP and added heel slide to increase knee flexion AROM. After knee stretching she was able to extend knee flat on mat table without increased pain. In prone, performed ultrasound followed my Sof tissue work to medial hamstring. Pt repors feeling much better at end of session. Updated HEP with heel slide and encouraged continued HEP compliance.     PT Next Visit Plan  Reassess hamstring tenderness, reiterate HEP, knee ROM exercises,contiunue ultrasound if helpful     PT Home Exercise Plan  supine SLR, sidelying hip abduction, seated active hamstring stretch, supine heel slide with strap     Consulted and Agree with Plan of Care   Patient       Patient will benefit from skilled therapeutic intervention in order to improve the following deficits and impairments:  Abnormal gait, Improper body mechanics, Pain, Decreased mobility, Decreased activity tolerance, Decreased endurance, Decreased range of motion, Decreased strength, Hypomobility, Obesity, Difficulty walking, Decreased balance  Visit Diagnosis: Acute pain of left knee  Muscle weakness (generalized)     Problem List Patient Active Problem List   Diagnosis Date Noted  . Hyperlipidemia 12/10/2017  . Diabetes (HCC) 11/18/2017  . Depression 02/08/2017  . Chronic low back pain 02/08/2017  . Asthma 02/08/2017  . Psoriasis 02/08/2017  . Health care maintenance 02/08/2017    Sherrie Mustache, PTA 12/10/2017, 3:01 PM  [facility] [address] [place], [place], [address] Phone: [phone]   Fax:  [phone]  Name: Amanda Larson MRN: [id number] Date of Birth: [birth year]

## 2017-12-10 NOTE — Assessment & Plan Note (Signed)
Order placed for mammogram.

## 2017-12-11 ENCOUNTER — Telehealth: Payer: Self-pay | Admitting: Internal Medicine

## 2017-12-11 NOTE — Telephone Encounter (Signed)
Called patient to discuss her urine microalbumin results. Had to leave a voicemail. The results did not show any protein in her urine or kidney damage. We will continue checking this once a year. She should call back with any questions.  Amanda CarolKaty Harlei Lehrmann, MD PGY-3

## 2017-12-17 ENCOUNTER — Ambulatory Visit: Payer: PRIVATE HEALTH INSURANCE | Admitting: Physical Therapy

## 2017-12-17 ENCOUNTER — Encounter: Payer: Self-pay | Admitting: Physical Therapy

## 2017-12-17 DIAGNOSIS — M25562 Pain in left knee: Secondary | ICD-10-CM

## 2017-12-17 DIAGNOSIS — M6281 Muscle weakness (generalized): Secondary | ICD-10-CM

## 2017-12-17 NOTE — Therapy (Signed)
[facility] [address] [place], [place], [address] Phone: [phone]   Fax:  [phone]  Physical Therapy Treatment  Patient Details  Name: Amanda Larson MRN: [id number] Date of Birth: [birth year] Referring Provider: Almon HerculesGonfa, Taye T, MD   Encounter Date: 12/17/2017  PT End of Session - 12/17/17 1516    Visit Number  3    Number of Visits  9    Date for PT Re-Evaluation  12/27/17    Authorization Type  self pay    PT Start Time  1420    PT Stop Time  1501    PT Time Calculation (min)  41 min    Activity Tolerance  Patient tolerated treatment well    Behavior During Therapy  [facility] for tasks assessed/performed       Past Medical History:  Diagnosis Date  . Bronchitis   . Chronic back pain   . Depression   . Osteoarthritis     Past Surgical History:  Procedure Laterality Date  . CESAREAN SECTION    . CHOLECYSTECTOMY    . TUBAL LIGATION      There were no vitals filed for this visit.  Subjective Assessment - 12/17/17 1514    Subjective  My back is killing me (rubbing Rt SIJ). Knee feels stiff. I have ben walking my dog a lot- denies stretching.  back 8/10 pain    Currently in Pain?  Yes    Pain Score  5     Pain Location  Knee    Pain Orientation  Left    Pain Descriptors / Indicators  Aching    Aggravating Factors   unable to sleep                       [facility] Adult PT Treatment/Exercise - 12/17/17 0001      Knee/Hip Exercises: Stretches   Passive Hamstring Stretch  Left;2 reps;30 seconds    Gastroc Stretch  Both;2 reps;30 seconds      Knee/Hip Exercises: Aerobic   Nustep  6 min L4 LE only      Knee/Hip Exercises: Supine   Straight Leg Raises  15 reps;Left      Knee/Hip Exercises: Sidelying   Hip ABduction  20 reps;10 reps;Both      Modalities   Modalities  Moist Heat      Moist Heat Therapy   Number Minutes Moist Heat  5 Minutes prior to manual, with education    Moist Heat  Location  -- Lt hamstrings      Manual Therapy   Manual therapy comments  edu on self roller at home    Soft tissue mobilization  IASTM Lt hamstrings             PT Education - 12/17/17 1516    Education provided  Yes    Education Details  shoe wear, pt asked about disability, anatomy of condition- HS creating LBP, HEP    Person(s) Educated  Patient    Methods  Explanation    Comprehension  Verbalized understanding;Need further instruction       PT Short Term Goals - 11/28/17 1509      PT SHORT TERM GOAL #1   Title  Pt will be compliant with basic HEP in 2 weeks    Baseline  HEP given today    Time  2    Period  Weeks    Status  New  PT SHORT TERM GOAL #2   Title  Pt will have 100 degrees L knee flexion ROM in 2 weeks    Baseline  Pt has 83 degrees L knee flexion    Time  2    Period  Weeks    Status  New        PT Long Term Goals - 11/28/17 1510      PT LONG TERM GOAL #1   Title  Pt will be able to return to work and perform all required duties independently without an increase in pain in 6 weeks    Baseline  Pt currently unable to work     Time  6    Period  Weeks    Status  New      PT LONG TERM GOAL #2   Title  Pt will be able to walk her dogs for 30 minutes without an increase in pain in 6 weeks    Baseline  Currently only able to walk for 10 minutes without an increase in pain.     Time  6    Period  Weeks      PT LONG TERM GOAL #3   Title  Pt will be able to perform all household activities independently without pain in 6 weeks    Baseline  Pt unable to perform all ADL's independently due to pain    Time  6    Period  Weeks    Status  New      PT LONG TERM GOAL #4   Title  Pt will improve her FOTO score to 40% disabilty in 6 weeks    Baseline  Pt's current FOTO score shows 59% disability    Time  6    Period  Weeks    Status  New            Plan - 12/17/17 1516    Clinical Impression Statement  Significant spasm noted in Lt  hamstring today resulting in severe pull from knee and lower back. Asked pt to roll, heat and stretch at least 2/day. Pt reported feeling better in back and knee after treatment today.     PT Treatment/Interventions  ADLs/Self Care Home Management;Cryotherapy;Electrical Stimulation;Iontophoresis 4mg /ml Dexamethasone;Moist Heat;Traction;Ultrasound;Gait training;Stair training;Functional mobility training;Therapeutic activities;Therapeutic exercise;Balance training;Neuromuscular re-education;Patient/family education;Manual techniques;Passive range of motion;Dry needling;Taping    PT Next Visit Plan  manual/US PRN, hip abductor strengthening, did she go try on shoes?    PT Home Exercise Plan  supine SLR, sidelying hip abduction, seated active hamstring stretch, supine heel slide with strap; self roller    Consulted and Agree with Plan of Care  Patient       Patient will benefit from skilled therapeutic intervention in order to improve the following deficits and impairments:  Abnormal gait, Improper body mechanics, Pain, Decreased mobility, Decreased activity tolerance, Decreased endurance, Decreased range of motion, Decreased strength, Hypomobility, Obesity, Difficulty walking, Decreased balance  Visit Diagnosis: Acute pain of left knee  Muscle weakness (generalized)     Problem List Patient Active Problem List   Diagnosis Date Noted  . Hyperlipidemia 12/10/2017  . Diabetes (HCC) 11/18/2017  . Depression 02/08/2017  . Chronic low back pain 02/08/2017  . Asthma 02/08/2017  . Psoriasis 02/08/2017  . Health care maintenance 02/08/2017   Sohrab Keelan C. Kade Demicco PT, DPT 12/17/17 3:24 PM   [facility] [address] [place], [place], [address] Phone: [phone]   Fax:  [phone]  Name: Amanda Larson MRN: [id number] Date of Birth: 07-Apr-1966

## 2017-12-24 ENCOUNTER — Encounter: Payer: Self-pay | Admitting: Physical Therapy

## 2017-12-24 ENCOUNTER — Ambulatory Visit: Payer: PRIVATE HEALTH INSURANCE | Admitting: Physical Therapy

## 2017-12-24 DIAGNOSIS — M6281 Muscle weakness (generalized): Secondary | ICD-10-CM

## 2017-12-24 DIAGNOSIS — M25562 Pain in left knee: Secondary | ICD-10-CM

## 2017-12-24 NOTE — Therapy (Signed)
[facility] [address] [place], [place], [address] Phone: [phone]   Fax:  [phone]  Physical Therapy Treatment/RE-evaluation  Patient Details  Name: Amanda GrapesKristina M Hendren MRN: [id number] Date of Birth: [birth year] Referring Provider: Candelaria Stagersaye Gonfa, MD   Encounter Date: 12/24/2017  PT End of Session - 12/24/17 1415    Visit Number  4    Number of Visits  10    Date for PT Re-Evaluation  01/17/18    Authorization Type  self pay    PT Start Time  1414    PT Stop Time  1455    PT Time Calculation (min)  41 min    Activity Tolerance  Patient tolerated treatment well    Behavior During Therapy  [facility] for tasks assessed/performed       Past Medical History:  Diagnosis Date  . Bronchitis   . Chronic back pain   . Depression   . Osteoarthritis     Past Surgical History:  Procedure Laterality Date  . CESAREAN SECTION    . CHOLECYSTECTOMY    . TUBAL LIGATION      There were no vitals filed for this visit.  Subjective Assessment - 12/24/17 1415    Subjective  Back is feeling much better. Knee is still waking me up at night time, stiff in the morning.     How long can you walk comfortably?  15 min    Patient Stated Goals  would like to walk dogs    Currently in Pain?  Yes    Pain Score  4     Pain Location  Knee    Pain Orientation  Left    Pain Descriptors / Indicators  Sore stiff         [facility] PT Assessment - 12/24/17 0001      Assessment   Medical Diagnosis  Lt knee pain    Referring Provider  Candelaria Stagersaye Gonfa, MD      Observation/Other Assessments   Focus on Therapeutic Outcomes (FOTO)   62% limited                   [facility] Adult PT Treatment/Exercise - 12/24/17 0001      Knee/Hip Exercises: Stretches   Passive Hamstring Stretch  Both;2 reps;30 seconds    Gastroc Stretch  Both;2 reps;30 seconds slant board, edu at counter      Knee/Hip Exercises: Aerobic   Nustep  5 min L4 LE only      Knee/Hip  Exercises: Seated   Long Arc Quad  15 reps ball bw knees    Sit to [employer]Sand  10 reps;without UE support cues for gluts      Knee/Hip Exercises: Sidelying   Clams  Lt x30    Other Sidelying Knee/Hip Exercises  reverse clam Lt x20      Manual Therapy   Soft tissue mobilization  IASTM Lt hamstrings             PT Education - 12/24/17 1500    Education provided  Yes    Education Details  goals, progress, POC, HEP, anatomy of condition    Person(s) Educated  Patient    Methods  Explanation;Demonstration;Tactile cues;Verbal cues;Handout    Comprehension  Verbalized understanding;Need further instruction;Returned demonstration;Verbal cues required;Tactile cues required       PT Short Term Goals - 12/24/17 1417      PT SHORT TERM GOAL #1   Title  Pt will be compliant with basic  HEP in 2 weeks    Baseline  doing exercises, they are soso    Status  Achieved      PT SHORT TERM GOAL #2   Title  Pt will have 100 degrees L knee flexion ROM in 2 weeks    Baseline  114    Status  Achieved        PT Long Term Goals - 12/24/17 1419      PT LONG TERM GOAL #1   Title  Pt will be able to return to work and perform all required duties independently without an increase in pain in 6 weeks    Baseline  not back to work at this time    Time  3    Period  Weeks    Status  On-going    Target Date  01/17/18      PT LONG TERM GOAL #2   Title  Pt will be able to walk her dogs for 30 minutes without an increase in pain in 6 weeks    Baseline  15 min today    Time  3    Period  Weeks    Status  On-going    Target Date  01/17/18      PT LONG TERM GOAL #3   Title  Pt will be able to perform all household activities independently without pain in 6 weeks    Baseline  doing things at home but has pain, up to 8/10 on average    Time  3    Period  Weeks    Status  On-going    Target Date  01/17/18      PT LONG TERM GOAL #4   Title  Pt will improve her FOTO score to 40% disabilty in 6 weeks     Baseline  62% limited    Time  3    Period  Weeks    Status  On-going    Target Date  01/17/18            Plan - 12/24/17 1506    Clinical Impression Statement  continued tightness in hamstrings with most pain in medial aspect. Tightness noted in inferior patellar glide. Pt has made some improvement since beginning PT especially with decrease in back pain that began with knee. Will continue PT at frequency of 2/week in order to reach goals.     PT Frequency  2x / week    PT Duration  3 weeks    PT Treatment/Interventions  ADLs/Self Care Home Management;Cryotherapy;Electrical Stimulation;Iontophoresis 4mg /ml Dexamethasone;Moist Heat;Traction;Ultrasound;Gait training;Stair training;Functional mobility training;Therapeutic activities;Therapeutic exercise;Balance training;Neuromuscular re-education;Patient/family education;Manual techniques;Passive range of motion;Dry needling;Taping    PT Next Visit Plan  manual/US PRN, hip abductor strengthening, did she go try on shoes?    PT Home Exercise Plan  supine SLR, sidelying hip abduction, seated active hamstring stretch, supine heel slide with strap; self roller; HSS & gastroc stretch before & after dog walks    Consulted and Agree with Plan of Care  Patient       Patient will benefit from skilled therapeutic intervention in order to improve the following deficits and impairments:  Abnormal gait, Improper body mechanics, Pain, Decreased mobility, Decreased activity tolerance, Decreased endurance, Decreased range of motion, Decreased strength, Hypomobility, Obesity, Difficulty walking, Decreased balance  Visit Diagnosis: Acute pain of left knee - Plan: PT plan of care cert/re-cert  Muscle weakness (generalized) - Plan: PT plan of care cert/re-cert     Problem List Patient  Active Problem List   Diagnosis Date Noted  . Hyperlipidemia 12/10/2017  . Diabetes (HCC) 11/18/2017  . Depression 02/08/2017  . Chronic low back pain 02/08/2017   . Asthma 02/08/2017  . Psoriasis 02/08/2017  . Health care maintenance 02/08/2017    Austen Oyster C. Guled Gahan PT, DPT 12/24/17 3:11 PM   [facility] [address] [place], [place], [address] Phone: [phone]   Fax:  [phone]  Name: SANAH KRASKA MRN: [id number] Date of Birth: [birth year]

## 2018-01-03 ENCOUNTER — Encounter

## 2018-01-07 ENCOUNTER — Ambulatory Visit: Payer: Self-pay | Admitting: Internal Medicine

## 2018-01-07 ENCOUNTER — Ambulatory Visit: Payer: PRIVATE HEALTH INSURANCE | Admitting: Physical Therapy

## 2018-01-08 ENCOUNTER — Ambulatory Visit: Payer: Self-pay

## 2018-01-09 ENCOUNTER — Encounter: Payer: Self-pay | Admitting: Physical Therapy

## 2018-01-09 ENCOUNTER — Ambulatory Visit: Payer: No Typology Code available for payment source | Attending: Family Medicine | Admitting: Physical Therapy

## 2018-01-09 DIAGNOSIS — M25562 Pain in left knee: Secondary | ICD-10-CM | POA: Diagnosis not present

## 2018-01-09 DIAGNOSIS — M6281 Muscle weakness (generalized): Secondary | ICD-10-CM | POA: Diagnosis present

## 2018-01-09 NOTE — Therapy (Signed)
[place], [place], [address] Phone: [phone]   Fax:  [phone]  Physical Therapy Treatment  Patient Details  Name: Amanda Larson MRN: [id number] Date of Birth: 1966/01/02 Referring Provider: Wendee Beavers, MD   Encounter Date: 01/09/2018  PT End of Session - 01/09/18 1411    Visit Number  5    Number of Visits  10    Date for PT Re-Evaluation  01/17/18    Authorization Type  self pay    PT Start Time  1330    PT Stop Time  1410    PT Time Calculation (min)  40 min    Activity Tolerance  Patient tolerated treatment well    Behavior During Therapy  [facility] for tasks assessed/performed       Past Medical History:  Diagnosis Date  . Bronchitis   . Chronic back pain   . Depression   . Osteoarthritis     Past Surgical History:  Procedure Laterality Date  . CESAREAN SECTION    . CHOLECYSTECTOMY    . TUBAL LIGATION      There were no vitals filed for this visit.  Subjective Assessment - 01/09/18 1332    Subjective  My knee is feeling OK, it hurts with this back and forth weather. In general everything we have done has been helping. I have checked out the shoes they were telling me about but I can't afford this right now.     Patient Stated Goals  would like to walk dogs    Currently in Pain?  Yes    Pain Score  5     Pain Location  Knee    Pain Orientation  Left    Pain Descriptors / Indicators  Sore;Tightness    Pain Type  Chronic pain    Pain Radiating Towards  none     Pain Onset  More than a month ago    Pain Frequency  Constant    Aggravating Factors   keeping it straight too long     Pain Relieving Factors  massage here    Effect of Pain on Daily Activities  moderate                        [facility] Adult PT Treatment/Exercise - 01/09/18 0001      Knee/Hip Exercises: Stretches   Active Hamstring Stretch Limitations  attempted, stopped due to shooting pains in the back      Passive Hamstring Stretch  -- attempted, limited by shootings pains low back     Piriformis Stretch  Both;2 reps;30 seconds      Knee/Hip Exercises: Standing   Heel Raises  Both;1 set;10 reps heel and toe     Knee Flexion  Both;1 set;10 reps;Left    Other Standing Knee Exercises  hip extension 2x10 with LE straight,  and with knee bent     Other Standing Knee Exercises  hip hikes 1x10 B       Knee/Hip Exercises: Seated   Long Arc Quad  Left;1 set;15 reps;Weights    Long Arc Quad Weight  3 lbs.    Hamstring Curl  Left;1 set;15 reps 3#      Knee/Hip Exercises: Supine   Bridges  Both;1 set;10 reps    Straight Leg Raises  Both;1 set;10 reps      Knee/Hip Exercises: Sidelying   Hip ABduction  Both;1 set;10 reps  Knee/Hip Exercises: Prone   Other Prone Exercises  L HS MET x5 with 5 second holds              PT Education - 01/09/18 1411    Education provided  Yes    Education Details  exercise form and purpose throughout session, use of tennis ball for self-massage to L HS     Person(s) Educated  Patient    Methods  Explanation    Comprehension  Verbalized understanding;Need further instruction       PT Short Term Goals - 12/24/17 1417      PT SHORT TERM GOAL #1   Title  Pt will be compliant with basic HEP in 2 weeks    Baseline  doing exercises, they are soso    Status  Achieved      PT SHORT TERM GOAL #2   Title  Pt will have 100 degrees L knee flexion ROM in 2 weeks    Baseline  114    Status  Achieved        PT Long Term Goals - 12/24/17 1419      PT LONG TERM GOAL #1   Title  Pt will be able to return to work and perform all required duties independently without an increase in pain in 6 weeks    Baseline  not back to work at this time    Time  3    Period  Weeks    Status  On-going    Target Date  01/17/18      PT LONG TERM GOAL #2   Title  Pt will be able to walk her dogs for 30 minutes without an increase in pain in 6 weeks    Baseline  15  min today    Time  3    Period  Weeks    Status  On-going    Target Date  01/17/18      PT LONG TERM GOAL #3   Title  Pt will be able to perform all household activities independently without pain in 6 weeks    Baseline  doing things at home but has pain, up to 8/10 on average    Time  3    Period  Weeks    Status  On-going    Target Date  01/17/18      PT LONG TERM GOAL #4   Title  Pt will improve her FOTO score to 40% disabilty in 6 weeks    Baseline  62% limited    Time  3    Period  Weeks    Status  On-going    Target Date  01/17/18            Plan - 01/09/18 1411    Clinical Impression Statement  Began session with STM to L medial HS, patient with difficulty in tolerating massage due to severe pain L hamstrings; attempted HS stretches today however in 90/90 position HS does not appear to lack flexibility and patient also not able to tolerate due to shooting pains in back with this position. She continues to have difficulty in keeping her knee straight and reports it is more comfortable with it bent. Performed functional exercise however patient with apparent difficulty in tolerating due to pain posterior L LE this session. Apparent significant weakness noted in L HS today. Trial of L HS MET reduced pain to 4/10 today at EOS.     Rehab Potential  Fair  PT Frequency  2x / week    PT Duration  3 weeks    PT Treatment/Interventions  ADLs/Self Care Home Management;Cryotherapy;Electrical Stimulation;Iontophoresis '4mg'$ /ml Dexamethasone;Moist Heat;Traction;Ultrasound;Gait training;Stair training;Functional mobility training;Therapeutic activities;Therapeutic exercise;Balance training;Neuromuscular re-education;Patient/family education;Manual techniques;Passive range of motion;Dry needling;Taping    PT Next Visit Plan  continue manual, general hip and gentle L HS strengthening; L HS contract/relax     PT Home Exercise Plan  supine SLR, sidelying hip abduction, seated active hamstring  stretch, supine heel slide with strap; self roller; HSS & gastroc stretch before & after dog walks    Consulted and Agree with Plan of Care  Patient       Patient will benefit from skilled therapeutic intervention in order to improve the following deficits and impairments:  Abnormal gait, Improper body mechanics, Pain, Decreased mobility, Decreased activity tolerance, Decreased endurance, Decreased range of motion, Decreased strength, Hypomobility, Obesity, Difficulty walking, Decreased balance  Visit Diagnosis: Acute pain of left knee  Muscle weakness (generalized)     Problem List Patient Active Problem List   Diagnosis Date Noted  . Hyperlipidemia 12/10/2017  . Diabetes ([place]) 11/18/2017  . Depression 02/08/2017  . Chronic low back pain 02/08/2017  . Asthma 02/08/2017  . Psoriasis 02/08/2017  . Health care maintenance 02/08/2017    Deniece Ree PT, DPT, CBIS  Supplemental Physical Therapist [place]   Pager [facility] [place] [facility] [address] [place], [place], [address] Phone: [phone]   Fax:  [phone]  Name: SHUREE BROSSART MRN: [id number] Date of Birth: Mar 27, 1966

## 2018-01-14 ENCOUNTER — Encounter: Payer: Self-pay | Admitting: Physical Therapy

## 2018-01-14 ENCOUNTER — Ambulatory Visit: Payer: No Typology Code available for payment source | Admitting: Physical Therapy

## 2018-01-14 DIAGNOSIS — M25562 Pain in left knee: Secondary | ICD-10-CM

## 2018-01-14 DIAGNOSIS — M6281 Muscle weakness (generalized): Secondary | ICD-10-CM

## 2018-01-14 NOTE — Therapy (Signed)
[facility] [address] [place], [place], [address] Phone: [phone]   Fax:  [phone]  Physical Therapy Treatment  Patient Details  Name: Amanda Larson MRN: [id number] Date of Birth: 05/01/1966 Referring Provider: Candelaria Stagers, MD   Encounter Date: 01/14/2018  PT End of Session - 01/14/18 1419    Visit Number  6    Number of Visits  10    Date for PT Re-Evaluation  01/17/18    Authorization Type  self pay    PT Start Time  0215    PT Stop Time  0258    PT Time Calculation (min)  43 min       Past Medical History:  Diagnosis Date  . Bronchitis   . Chronic back pain   . Depression   . Osteoarthritis     Past Surgical History:  Procedure Laterality Date  . CESAREAN SECTION    . CHOLECYSTECTOMY    . TUBAL LIGATION      There were no vitals filed for this visit.      [facility] PT Assessment - 01/14/18 0001      Observation/Other Assessments   Focus on Therapeutic Outcomes (FOTO)   66% limited       AROM   Left Knee Extension  12 hyperextension    Left Knee Flexion  114      Strength   Right Knee Flexion  5/5    Right Knee Extension  5/5    Left Knee Flexion  4-/5 Concordant pain noted    Left Knee Extension  4/5                   [facility] Adult PT Treatment/Exercise - 01/14/18 0001      Knee/Hip Exercises: Stretches   Active Hamstring Stretch  3 reps;10 seconds      Knee/Hip Exercises: Aerobic   Nustep  5 min L4 LE only      Knee/Hip Exercises: Supine   Quad Sets  10 reps    Heel Slides  10 reps;2 sets    Bridges  Both;1 set;10 reps    Straight Leg Raises  Both;1 set;10 reps      Knee/Hip Exercises: Sidelying   Hip ABduction  Both;1 set;10 reps      Modalities   Modalities  Iontophoresis      Iontophoresis   Type of Iontophoresis  Dexamethasone    Location  left medial hamstrin    Dose  1ml    Time  6 hour stat patch       Manual Therapy   Soft tissue mobilization  IASTM  Lt hamstrings               PT Short Term Goals - 12/24/17 1417      PT SHORT TERM GOAL #1   Title  Pt will be compliant with basic HEP in 2 weeks    Baseline  doing exercises, they are soso    Status  Achieved      PT SHORT TERM GOAL #2   Title  Pt will have 100 degrees L knee flexion ROM in 2 weeks    Baseline  114    Status  Achieved        PT Long Term Goals - 01/14/18 1422      PT LONG TERM GOAL #1   Title  Pt will be able to return to work and perform all required duties independently without an increase  in pain in 6 weeks    Baseline  not back to work at this time    Time  3    Period  Weeks    Status  On-going      PT LONG TERM GOAL #2   Title  Pt will be able to walk her dogs for 30 minutes without an increase in pain in 6 weeks    Baseline  20-25 at best, has not been able to this week due to increased pain.     Time  3    Period  Weeks    Status  On-going      PT LONG TERM GOAL #3   Title  Pt will be able to perform all household activities independently without pain in 6 weeks    Baseline  can do 20 minutes, 8/10 pain       PT LONG TERM GOAL #4   Title  Pt will improve her FOTO score to 40% disabilty in 6 weeks    Baseline  FOTO score worsened    Time  3    Period  Weeks    Status  On-going            Plan - 01/14/18 1504    Clinical Impression Statement  Pt arrives reporting increased soreness and pain posterior knee. She reports minimal overall improvement with PT. Her MMT somewhat improved. Significant hamsting weakness and pain with MMT. SHe is not back at work and does not think she can perform job duties She can walk her dog 15-25 minutes before increased pain. She is very tender to palpation medial distal hamstring. Focused gentl IASTM with decreased pain at end of treatment. Trial of Ionto to decrease pain and inflammation at medial hamsting. Pt sees MD for follow up tomorrow. She may place PT on hold for discharge after MD  appointment.     PT Next Visit Plan  continue manual, general hip and gentle L HS strengthening; L HS contract/relax , assess ionto , what did MD say.     PT Home Exercise Plan  supine SLR, sidelying hip abduction, seated active hamstring stretch, supine heel slide with strap; self roller; HSS & gastroc stretch before & after dog walks       Patient will benefit from skilled therapeutic intervention in order to improve the following deficits and impairments:  Abnormal gait, Improper body mechanics, Pain, Decreased mobility, Decreased activity tolerance, Decreased endurance, Decreased range of motion, Decreased strength, Hypomobility, Obesity, Difficulty walking, Decreased balance  Visit Diagnosis: Acute pain of left knee  Muscle weakness (generalized)     Problem List Patient Active Problem List   Diagnosis Date Noted  . Hyperlipidemia 12/10/2017  . Diabetes ([facility]) 11/18/2017  . Depression 02/08/2017  . Chronic low back pain 02/08/2017  . Asthma 02/08/2017  . Psoriasis 02/08/2017  . Health care maintenance 02/08/2017    Sherrie Mustache, PTA 01/14/2018, 3:09 PM  [facility] [address] [place], [place], [address] Phone: [phone]   Fax:  [phone]  Name: KAYTLYNN KOCHAN MRN: [id number] Date of Birth: 04-06-1966

## 2018-01-15 ENCOUNTER — Ambulatory Visit (INDEPENDENT_AMBULATORY_CARE_PROVIDER_SITE_OTHER): Payer: PRIVATE HEALTH INSURANCE | Admitting: Internal Medicine

## 2018-01-15 ENCOUNTER — Other Ambulatory Visit: Payer: Self-pay

## 2018-01-15 ENCOUNTER — Encounter: Payer: Self-pay | Admitting: Internal Medicine

## 2018-01-15 VITALS — BP 110/82 | HR 79 | Temp 97.9°F | Wt 315.0 lb

## 2018-01-15 DIAGNOSIS — E119 Type 2 diabetes mellitus without complications: Secondary | ICD-10-CM

## 2018-01-15 DIAGNOSIS — M25562 Pain in left knee: Secondary | ICD-10-CM

## 2018-01-15 MED ORDER — METFORMIN HCL 1000 MG PO TABS: 1000.0000 mg | ORAL_TABLET | Freq: Two times a day (BID) | ORAL | 0 refills | Status: DC

## 2018-01-15 NOTE — Assessment & Plan Note (Signed)
Uncontrolled. A1c 8.0. Goal <7.0.  - Increase Metformin from  bid to  bid - Follow-up in 1 month to recheck A1c

## 2018-01-15 NOTE — Assessment & Plan Note (Signed)
Has some chronic bilateral knee pain at baseline, but this has gotten much worse since falling in 11/2017. X-rays negative for fracture at the time. Unimproved with conservative management and PT. She has medial joint line tenderness and popping/clicking, so think medial meniscal tear is the most likely diagnosis. Recent x-rays did not show significant osteoarthritis, so think this is less likely. She is tender over the hamstring tendons, so she could have tendinopathy. No fevers and no joint swelling/erythema, so septic arthritis unlikely. - Given her significant pain and lack of improvement with conservative treatment for the last two months, will obtain MRI left knee to evaluate further.  - Discussed cortisone injection today. Patient would like to hold off on this. - Can consider referral to ortho in the future

## 2018-01-15 NOTE — Progress Notes (Signed)
   [facility] Phone: [phone]  Subjective:  Amanda Larson is a 52 year old female presenting to clinic with left knee pain and for follow-up of her T2DM.  Left Knee Pain: Seen in clinic on 11/11/17 after having injuring her left knee while walking down the stairs of her porch. X-rays done at the time did not show any fracture, but did show some patellofemoral compartment joint space narrowing. She was then referred to physical therapy. She has been doing this twice a week. She feels like PT helps her pain for 1-2 days, but then the pain returns. The pain is located on the medial side of her knee. The pain is "throbbing". She endorses clicking and popping. No locking, no erythema, no edema. The pain is worse at night and keeps her up at night.  T2DM: Checking CBGs once in the morning. Have been running in the 140s-190s. No lows. No diarrhea or nausea. No polyuria or polydipsia.  ROS: See HPI for pertinent positives and negatives  Past Medical History- T2DM, chronic low back pain, depression, HLD, asthma  Family history reviewed for today's visit. No changes.  Social history- patient is a former smoker  Objective: BP 110/82   Pulse 79   Temp 97.9 F (36.6 C) (Oral)   Wt (!) 315 lb (142.9 kg)   LMP 01/17/2014   SpO2 95%   BMI 57.61 kg/m  Gen: NAD, alert, cooperative with exam HEENT: NCAT, EOMI, MMM Neck: FROM, supple CV: RRR, no murmur Resp: CTABL, no wheezes, normal work of breathing Msk: left knee without edema, erythema, or gross deformity. +tenderness to palpation along the medial joint line and the medial hamstring tendons. Full ROM. +mild crepitus. Valgus and varus stress tests negative. Anterior and posterior drawer negative. Unable to perform Thessaly's due to inability to stand on one leg.  Assessment/Plan: Left Knee Pain: Has some chronic bilateral knee pain at baseline, but this has gotten much worse since falling in 11/2017. X-rays negative for  fracture at the time. Unimproved with conservative management and PT. She has medial joint line tenderness and popping/clicking, so think medial meniscal tear is the most likely diagnosis. Recent x-rays did not show significant osteoarthritis, so think this is less likely. She is tender over the hamstring tendons, so she could have tendinopathy. No fevers and no joint swelling/erythema, so septic arthritis unlikely. - Given her significant pain and lack of improvement with conservative treatment for the last two months, will obtain MRI left knee to evaluate further.  - Discussed cortisone injection today. Patient would like to hold off on this. - Can consider referral to ortho in the future  T2DM: Uncontrolled. A1c 8.0. Goal <7.0.  - Increase Metformin from  bid to  bid - Follow-up in 1 month to recheck A1c   Willadean Carol, MD PGY-3

## 2018-01-15 NOTE — Patient Instructions (Addendum)
It was so nice to see you today!  For your diabetes- we will increase your Metformin from  twice a day to  twice a day. I have sent in a new prescription to your pharmacy.  For your knee pain- I have ordered an MRI. I will call you with these results.  Please come back to see [place] in 1 month and we will recheck you A1c at that visit.  -Dr. Nancy Marus

## 2018-01-16 ENCOUNTER — Encounter: Payer: Self-pay | Admitting: Physical Therapy

## 2018-01-16 ENCOUNTER — Ambulatory Visit: Payer: No Typology Code available for payment source | Admitting: Physical Therapy

## 2018-01-16 DIAGNOSIS — M25562 Pain in left knee: Secondary | ICD-10-CM

## 2018-01-16 DIAGNOSIS — M6281 Muscle weakness (generalized): Secondary | ICD-10-CM

## 2018-01-16 NOTE — Therapy (Signed)
[facility] [address] [place], [place], [address] Phone: [phone]   Fax:  [phone]  Physical Therapy Treatment  Patient Details  Name: Amanda Larson MRN: [id number] Date of Birth: 1966/04/10 Referring Provider: Candelaria Stagers, MD   Encounter Date: 01/16/2018  PT End of Session - 01/16/18 1338    Visit Number  7    Number of Visits  10    Date for PT Re-Evaluation  01/17/18    Authorization Type  self pay    PT Start Time  0130    PT Stop Time  0220    PT Time Calculation (min)  50 min       Past Medical History:  Diagnosis Date  . Bronchitis   . Chronic back pain   . Depression   . Osteoarthritis     Past Surgical History:  Procedure Laterality Date  . CESAREAN SECTION    . CHOLECYSTECTOMY    . TUBAL LIGATION      There were no vitals filed for this visit.  Subjective Assessment - 01/16/18 1332    Subjective  Will have Knee MRI next Friday May 17th    Currently in Pain?  Yes    Pain Score  6     Pain Location  Knee    Pain Orientation  Left                       [facility] Adult PT Treatment/Exercise - 01/16/18 0001      Knee/Hip Exercises: Stretches   Active Hamstring Stretch  3 reps;30 seconds      Knee/Hip Exercises: Standing   Heel Raises  15 reps    Knee Flexion  1 set;10 reps;Left    Lateral Step Up  10 reps;Step Height: 6"    Forward Step Up  10 reps;Step Height: 6"    Step Down  10 reps;Step Height: 4"      Knee/Hip Exercises: Supine   Quad Sets  10 reps    Heel Slides  10 reps    Bridges  Both;1 set;10 reps    Straight Leg Raises  10 reps;1 set      Knee/Hip Exercises: Sidelying   Hip ABduction  1 set;15 reps    Clams  Lt x 15       Ultrasound   Ultrasound Location  medial hamstring distal    Ultrasound Parameters  1.0 w/cm2 pulsed sound head     Ultrasound Goals  Pain      Iontophoresis   Type of Iontophoresis  Dexamethasone    Location  left medial  hamstrin    Dose  1ml    Time  6 hour stat patch       Manual Therapy   Soft tissue mobilization  --               PT Short Term Goals - 12/24/17 1417      PT SHORT TERM GOAL #1   Title  Pt will be compliant with basic HEP in 2 weeks    Baseline  doing exercises, they are soso    Status  Achieved      PT SHORT TERM GOAL #2   Title  Pt will have 100 degrees L knee flexion ROM in 2 weeks    Baseline  114    Status  Achieved        PT Long Term Goals - 01/14/18 1422  PT LONG TERM GOAL #1   Title  Pt will be able to return to work and perform all required duties independently without an increase in pain in 6 weeks    Baseline  not back to work at this time    Time  3    Period  Weeks    Status  On-going      PT LONG TERM GOAL #2   Title  Pt will be able to walk her dogs for 30 minutes without an increase in pain in 6 weeks    Baseline  20-25 at best, has not been able to this week due to increased pain.     Time  3    Period  Weeks    Status  On-going      PT LONG TERM GOAL #3   Title  Pt will be able to perform all household activities independently without pain in 6 weeks    Baseline  can do 20 minutes, 8/10 pain       PT LONG TERM GOAL #4   Title  Pt will improve her FOTO score to 40% disabilty in 6 weeks    Baseline  FOTO score worsened    Time  3    Period  Weeks    Status  On-going            Plan - 01/16/18 1339    Clinical Impression Statement  Pt reports MD wanted to give her an injection however she is fearful of needles. She is scheduled for a MRI and may need an injection, depending on results. She thought the ionto patch was helpful and slept better afterward. Began step ups, she did well. Performed US and Ionto to medial hamstring.     PT Next Visit Plan  ERO next visit ; continue , ionto, [place], manual to medial hamstring. continue manual, general hip and gentle L HS strengthening; L HS contract/relax , assess ionto    PT Home Exercise  Plan  supine SLR, sidelying hip abduction, seated active hamstring stretch, supine heel slide with strap; self roller; HSS & gastroc stretch before & after dog walks    Consulted and Agree with Plan of Care  Patient       Patient will benefit from skilled therapeutic intervention in order to improve the following deficits and impairments:  Abnormal gait, Improper body mechanics, Pain, Decreased mobility, Decreased activity tolerance, Decreased endurance, Decreased range of motion, Decreased strength, Hypomobility, Obesity, Difficulty walking, Decreased balance  Visit Diagnosis: Acute pain of left knee  Muscle weakness (generalized)     Problem List Patient Active Problem List   Diagnosis Date Noted  . Acute pain of left knee 01/15/2018  . Hyperlipidemia 12/10/2017  . Diabetes ([facility]) 11/18/2017  . Depression 02/08/2017  . Chronic low back pain 02/08/2017  . Asthma 02/08/2017  . Psoriasis 02/08/2017  . Health care maintenance 02/08/2017    Sherrie Mustache, PTA 01/16/2018, 2:23 PM  [facility] [address] [place], [place], [address] Phone: [phone]   Fax:  [phone]  Name: Amanda Larson MRN: [id number] Date of Birth: [birth year]

## 2018-01-20 ENCOUNTER — Ambulatory Visit: Payer: No Typology Code available for payment source | Admitting: Physical Therapy

## 2018-01-20 ENCOUNTER — Encounter: Payer: Self-pay | Admitting: Physical Therapy

## 2018-01-20 DIAGNOSIS — M6281 Muscle weakness (generalized): Secondary | ICD-10-CM

## 2018-01-20 DIAGNOSIS — M25562 Pain in left knee: Secondary | ICD-10-CM | POA: Diagnosis not present

## 2018-01-20 NOTE — Therapy (Signed)
[facility] [address] [place], [place], [address] Phone: [phone]   Fax:  [phone]  Physical Therapy Treatment / Re-certification  Patient Details  Name: Amanda Larson MRN: [id number] Date of Birth: 12/14/1965 Referring Provider: Candelaria Stagers, MD   Encounter Date: 01/20/2018  PT End of Session - 01/20/18 0941    Visit Number  8    Number of Visits  16    Date for PT Re-Evaluation  02/17/18    Authorization Type  self pay    PT Start Time  0930    PT Stop Time  1018    PT Time Calculation (min)  48 min    Activity Tolerance  Patient tolerated treatment well    Behavior During Therapy  [facility] for tasks assessed/performed       Past Medical History:  Diagnosis Date  . Bronchitis   . Chronic back pain   . Depression   . Osteoarthritis     Past Surgical History:  Procedure Laterality Date  . CESAREAN SECTION    . CHOLECYSTECTOMY    . TUBAL LIGATION      There were no vitals filed for this visit.  Subjective Assessment - 01/20/18 0937    Subjective  "with the cool weather I opened all the windows and noticed I had more swelling and pain yesterday. It seems to flucutate with pain and swelling. I had the most pain yesterday than I had in a while"     Patient Stated Goals  would like to walk dogs    Currently in Pain?  Yes    Pain Score  7  last took medication for pain at 8:30    Pain Orientation  Right    Pain Descriptors / Indicators  Tightness;Sore    Pain Type  Chronic pain    Pain Onset  More than a month ago    Pain Frequency  Constant    Aggravating Factors   not moving, keeping it straight for long periods    Pain Relieving Factors  massage here         [facility] PT Assessment - 01/20/18 0945      ROM / Strength   AROM / PROM / Strength  PROM      AROM   Left Knee Extension  -- hyperextension    Left Knee Flexion  62      PROM   PROM Assessment Site  Knee    Right/Left Knee  Left    Left  Knee Flexion  90      Strength   Left Knee Flexion  4-/5 pain during testing    Left Knee Extension  4-/5 pain during testing                   [facility] Adult PT Treatment/Exercise - 01/20/18 1021      Knee/Hip Exercises: Seated   Long Arc Quad  Left;1 set;15 reps;Weights    Hamstring Curl  Left;1 set;15 reps      Knee/Hip Exercises: Supine   Heel Slides  10 reps    Straight Leg Raises  10 reps;1 set    Other Supine Knee/Hip Exercises  clamshells 2 x 15 with redtheraband      Iontophoresis   Type of Iontophoresis  Dexamethasone    Location  left medial hamstrin    Dose  /ml    Time  6 hour stat patch       Manual Therapy  Manual therapy comments  MTPR along distal hamstrings x 3    Soft tissue mobilization  IASTM Lt hamstrings             PT Education - 01/20/18 1022    Education provided  Yes    Education Details  reviewed previous provided HEP and benefits of continued HEP and changing position periodically and working on avoiding hyper extension of the knee as much as possible. Discussed plan to renew for more PT but if no more progress is made in that time frame plan to refer back to her MD for further assessment.     Person(s) Educated  Patient    Methods  Explanation;Verbal cues    Comprehension  Verbalized understanding;Verbal cues required       PT Short Term Goals - 01/20/18 0949      PT SHORT TERM GOAL #1   Title  Pt will be compliant with basic HEP in 2 weeks    Time  2    Period  Weeks    Status  Achieved      PT SHORT TERM GOAL #2   Title  Pt will have 100 degrees L knee flexion ROM in 2 weeks    Baseline  114    Time  2    Period  Weeks    Status  Achieved        PT Long Term Goals - 01/20/18 0949      PT LONG TERM GOAL #1   Title  Pt will be able to return to work and perform all required duties independently without an increase in pain in 6 weeks    Time  3    Period  Weeks    Status  On-going    Target Date  02/17/18       PT LONG TERM GOAL #2   Title  Pt will be able to walk her dogs for 30 minutes without an increase in pain in 6 weeks    Baseline  able to walk 10-15 min with 7/10 pain     Time  6    Period  Weeks    Status  On-going    Target Date  02/17/18      PT LONG TERM GOAL #3   Title  Pt will be able to perform all household activities independently without pain in 6 weeks    Baseline  can do 20 minutes, 8/10 pain (reports no changes)    Time  6    Period  Weeks    Status  On-going    Target Date  02/17/18      PT LONG TERM GOAL #4   Title  Pt will improve her FOTO score to 40% disabilty in 6 weeks    Time  3    Period  Weeks    Status  On-going    Target Date  02/17/18            Plan - 01/20/18 1023    Clinical Impression Statement  Amanda Larson continues demonstrate significant TTP along the distal hamstrings with significant swelling noted in the knee compared bil. She exhibits regression of 62 AROM flexion compared to inital measures but was able to get 90 degrees flexion passively. continued working on hip / knee strengthening and applied ionto patch for inflammation. she would benefit from continued physical therapy to work on reducing pain and improving knee ROm and strength. Discussed if no progress is made in the  next 4 weeks may refer back to her MD for further assessment.     PT Frequency  2x / week    PT Duration  4 weeks    PT Treatment/Interventions  ADLs/Self Care Home Management;Cryotherapy;Electrical Stimulation;Iontophoresis /ml Dexamethasone;Moist Heat;Traction;Ultrasound;Gait training;Stair training;Functional mobility training;Therapeutic activities;Therapeutic exercise;Balance training;Neuromuscular re-education;Patient/family education;Manual techniques;Passive range of motion;Dry needling;Taping    PT Next Visit Plan  continue , ionto, [place], manual to medial hamstring. continue manual, general hip and gentle L HS strengthening;     PT Home Exercise Plan   supine SLR, sidelying hip abduction, seated active hamstring stretch, supine heel slide with strap; self roller; HSS & gastroc stretch before & after dog walks    Consulted and Agree with Plan of Care  Patient       Patient will benefit from skilled therapeutic intervention in order to improve the following deficits and impairments:  Abnormal gait, Improper body mechanics, Pain, Decreased mobility, Decreased activity tolerance, Decreased endurance, Decreased range of motion, Decreased strength, Hypomobility, Obesity, Difficulty walking, Decreased balance  Visit Diagnosis: Muscle weakness (generalized)  Acute pain of left knee     Problem List Patient Active Problem List   Diagnosis Date Noted  . Acute pain of left knee 01/15/2018  . Hyperlipidemia 12/10/2017  . Diabetes (HCC) 11/18/2017  . Depression 02/08/2017  . Chronic low back pain 02/08/2017  . Asthma 02/08/2017  . Psoriasis 02/08/2017  . Health care maintenance 02/08/2017   Lulu Riding PT, DPT, LAT, ATC  01/20/18  10:33 AM      [facility] [address] [place], [place], [address] Phone: [phone]   Fax:  [phone]  Name: Amanda Larson MRN: [id number] Date of Birth: [birth year]

## 2018-01-21 ENCOUNTER — Ambulatory Visit: Payer: No Typology Code available for payment source | Admitting: Physical Therapy

## 2018-01-23 ENCOUNTER — Ambulatory Visit: Payer: No Typology Code available for payment source | Admitting: Physical Therapy

## 2018-01-23 ENCOUNTER — Encounter: Payer: Self-pay | Admitting: Physical Therapy

## 2018-01-23 DIAGNOSIS — M25562 Pain in left knee: Secondary | ICD-10-CM

## 2018-01-23 DIAGNOSIS — M6281 Muscle weakness (generalized): Secondary | ICD-10-CM

## 2018-01-23 NOTE — Therapy (Signed)
[facility] [address] [place], [place], [address] Phone: [phone]   Fax:  [phone]  Physical Therapy Treatment  Patient Details  Name: Amanda Larson MRN: [id number] Date of Birth: 1966-03-04 Referring Provider: Candelaria Stagers, MD   Encounter Date: 01/23/2018  PT End of Session - 01/23/18 1503    Visit Number  9    Number of Visits  16    Date for PT Re-Evaluation  02/17/18    PT Start Time  1418    PT Stop Time  1501    PT Time Calculation (min)  43 min    Activity Tolerance  Patient tolerated treatment well    Behavior During Therapy  [facility] for tasks assessed/performed       Past Medical History:  Diagnosis Date  . Bronchitis   . Chronic back pain   . Depression   . Osteoarthritis     Past Surgical History:  Procedure Laterality Date  . CESAREAN SECTION    . CHOLECYSTECTOMY    . TUBAL LIGATION      There were no vitals filed for this visit.  Subjective Assessment - 01/23/18 1426    Subjective  I feel unstable at walking  the dog at night.  i take someone with me. I feel better for the next day or 2 after PT    Currently in Pain?  Yes    Pain Score  7     Pain Location  Knee    Pain Orientation  Left;Posterior;Anterior    Pain Descriptors / Indicators  -- pressure,   hurting                       [facility] Adult PT Treatment/Exercise - 01/23/18 0001      Ultrasound   Ultrasound Location  distal hamstrings    Ultrasound Parameters  1.0 watts/cm2,  50 %    Ultrasound Goals  Pain      Manual Therapy   Manual Therapy  Taping    Manual therapy comments  retrograde soft tissue work posterior anterior thigh,  tickelish at lymphnode groin.  so brief    Soft tissue mobilization  anterior horn press with knee flexion increased to 112 degrees.    Kinesiotex  Inhibit Muscle      Kinesiotix   Inhibit Muscle   hamstrings             PT Education - 01/23/18 1503    Education provided   Yes    Education Details  edema and tape info    Person(s) Educated  Patient    Methods  Explanation;Demonstration    Comprehension  Verbalized understanding       PT Short Term Goals - 01/20/18 0949      PT SHORT TERM GOAL #1   Title  Pt will be compliant with basic HEP in 2 weeks    Time  2    Period  Weeks    Status  Achieved      PT SHORT TERM GOAL #2   Title  Pt will have 100 degrees L knee flexion ROM in 2 weeks    Baseline  114    Time  2    Period  Weeks    Status  Achieved        PT Long Term Goals - 01/20/18 6962      PT LONG TERM GOAL #1   Title  Pt will be able  to return to work and perform all required duties independently without an increase in pain in 6 weeks    Time  3    Period  Weeks    Status  On-going    Target Date  02/17/18      PT LONG TERM GOAL #2   Title  Pt will be able to walk her dogs for 30 minutes without an increase in pain in 6 weeks    Baseline  able to walk 10-15 min with 7/10 pain     Time  6    Period  Weeks    Status  On-going    Target Date  02/17/18      PT LONG TERM GOAL #3   Title  Pt will be able to perform all household activities independently without pain in 6 weeks    Baseline  can do 20 minutes, 8/10 pain (reports no changes)    Time  6    Period  Weeks    Status  On-going    Target Date  02/17/18      PT LONG TERM GOAL #4   Title  Pt will improve her FOTO score to 40% disabilty in 6 weeks    Time  3    Period  Weeks    Status  On-going    Target Date  02/17/18            Plan - 01/23/18 1503    Clinical Impression Statement  Pain continues.  MRI tomorrow.  Manual and tape helpful.  Increased AAROM from 90 to 112 with pressing anterior horm with movement.   Less pain with awlking at end of session    PT Next Visit Plan  continue , , [place], manual to medial hamstring. continue manual, general hip and gentle L HS strengthening; See how  MRI went    PT Home Exercise Plan  supine SLR, sidelying hip abduction,  seated active hamstring stretch, supine heel slide with strap; self roller; HSS & gastroc stretch before & after dog walks    Consulted and Agree with Plan of Care  Patient       Patient will benefit from skilled therapeutic intervention in order to improve the following deficits and impairments:     Visit Diagnosis: Muscle weakness (generalized)  Acute pain of left knee     Problem List Patient Active Problem List   Diagnosis Date Noted  . Acute pain of left knee 01/15/2018  . Hyperlipidemia 12/10/2017  . Diabetes (HCC) 11/18/2017  . Depression 02/08/2017  . Chronic low back pain 02/08/2017  . Asthma 02/08/2017  . Psoriasis 02/08/2017  . Health care maintenance 02/08/2017    Jahmier Willadsen PTA 01/23/2018, 3:20 PM  [facility] [address] [place], [place], [address] Phone: [phone]   Fax:  [phone]  Name: Amanda Larson MRN: [id number] Date of Birth: 12-Mar-1966

## 2018-01-24 ENCOUNTER — Ambulatory Visit (HOSPITAL_COMMUNITY)
Admission: RE | Admit: 2018-01-24 | Discharge: 2018-01-24 | Disposition: A | Discharge status: Home / Self Care | Payer: No Typology Code available for payment source | Source: Ambulatory Visit | Attending: Family Medicine | Admitting: Family Medicine

## 2018-01-24 DIAGNOSIS — S83242A Other tear of medial meniscus, current injury, left knee, initial encounter: Secondary | ICD-10-CM | POA: Insufficient documentation

## 2018-01-24 DIAGNOSIS — M25462 Effusion, left knee: Secondary | ICD-10-CM | POA: Insufficient documentation

## 2018-01-24 DIAGNOSIS — X58XXXA Exposure to other specified factors, initial encounter: Secondary | ICD-10-CM | POA: Insufficient documentation

## 2018-01-24 DIAGNOSIS — M25562 Pain in left knee: Secondary | ICD-10-CM

## 2018-01-26 ENCOUNTER — Other Ambulatory Visit: Payer: Self-pay | Admitting: Internal Medicine

## 2018-01-27 ENCOUNTER — Other Ambulatory Visit: Payer: Self-pay | Admitting: Internal Medicine

## 2018-01-27 ENCOUNTER — Telehealth: Payer: Self-pay

## 2018-01-27 DIAGNOSIS — S83242A Other tear of medial meniscus, current injury, left knee, initial encounter: Secondary | ICD-10-CM

## 2018-01-27 NOTE — Telephone Encounter (Signed)
Yes, it is for her chronic low back pain. Thank you!

## 2018-01-27 NOTE — Telephone Encounter (Signed)
[facility] calling regarding tramadol rx. Needs to know Dx for why shes taking. I'm guessing its for her chronic low back pain- but if you will let me know for sure I will call them back. [facility] call back [phone] Shawna Orleans, RN

## 2018-01-27 NOTE — Telephone Encounter (Signed)
Pharmacy informed Rx for tramadol is for chronic low back pain. Shawna Orleans, RN

## 2018-02-05 ENCOUNTER — Telehealth: Payer: Self-pay | Admitting: Physical Therapy

## 2018-02-05 ENCOUNTER — Ambulatory Visit: Payer: No Typology Code available for payment source | Admitting: Physical Therapy

## 2018-02-05 NOTE — Telephone Encounter (Signed)
No-show. Attempted to call patient's home number but call went to voicemail and per VM message, unsure if this is correct number for patient. Did not leave message in this case, unable to reach patient today.    Nedra Hai PT, DPT, CBIS  Supplemental Physical Therapist [facility]   Pager [phone]

## 2018-02-06 ENCOUNTER — Ambulatory Visit (INDEPENDENT_AMBULATORY_CARE_PROVIDER_SITE_OTHER): Payer: No Typology Code available for payment source | Admitting: Orthopedic Surgery

## 2018-02-06 ENCOUNTER — Encounter (INDEPENDENT_AMBULATORY_CARE_PROVIDER_SITE_OTHER): Payer: Self-pay | Admitting: Orthopedic Surgery

## 2018-02-06 DIAGNOSIS — M1712 Unilateral primary osteoarthritis, left knee: Secondary | ICD-10-CM

## 2018-02-06 DIAGNOSIS — S838X2A Sprain of other specified parts of left knee, initial encounter: Secondary | ICD-10-CM

## 2018-02-08 ENCOUNTER — Encounter (INDEPENDENT_AMBULATORY_CARE_PROVIDER_SITE_OTHER): Payer: Self-pay | Admitting: Orthopedic Surgery

## 2018-02-08 NOTE — Progress Notes (Signed)
Office Visit Note   Patient: Amanda Larson           Date of Birth: 12/21/1965           MRN: [id number] Visit Date: 02/06/2018 Requested by: Campbell StallMayo, Katy Dodd, MD [address] [place], [place] [address] PCP: Campbell StallMayo, Katy Dodd, MD  Subjective: Chief Complaint  Patient presents with  . Left Knee - Pain    HPI: Amanda Larson is a patient with left knee pain.  3 months ago she stepped off a curb and heard a crunch in her knee.  Since that time she describes swelling weakness locking giving way.  MRI scan has been done and is reviewed today.  MRI scan shows medial meniscal root avulsion.  Some swelling is present.  Not much in the way of arthritis is in her knee yet.  Taking Ultram and diclofenac daily for this as well as other arthritic problems.  She does have a history of psoriatic and other autoimmune arthropathies.  Patient also has diabetes and a high BMI at 57.6.  She works with the mentally challenged.  She is applying for disability.              ROS: All systems reviewed are negative as they relate to the chief complaint within the history of present illness.  Patient denies  fevers or chills.   Assessment & Plan: Visit Diagnoses:  1. Injury of meniscus of left knee, initial encounter   2. Unilateral primary osteoarthritis, left knee     Plan: Impression is left knee pain with meniscal root avulsion in a patient who has high body mass index diabetes and significant psoriatic plaques on the knees.  All of these factors prevent surgical intervention at this time.  I did describe to Amanda Larson how meniscal root avulsion repair could be considered but really is not an option at this time due to the plaque on her knee which would prohibit arthroscopic intervention due to the risk of infection.  I think this will progress fairly rapidly to the right us in this patient because of her high body mass index.  Her best option will be weight loss to prolong the life of her knee.  If the plaque  would improve to the point where the skin is clear then surgical intervention could be considered; however, my results in high BMI patients with meniscal root injuries that are repaired has not been optimal.  Follow-Up Instructions: Return if symptoms worsen or fail to improve.   Orders:  No orders of the defined types were placed in this encounter.  No orders of the defined types were placed in this encounter.     Procedures: No procedures performed   Clinical Data: No additional findings.  Objective: Vital Signs: LMP 01/17/2014   Physical Exam:   Constitutional: Patient appears well-developed HEENT:  Head: Normocephalic Eyes:EOM are normal Neck: Normal range of motion Cardiovascular: Normal rate Pulmonary/chest: Effort normal Neurologic: Patient is alert Skin: Skin is warm Psychiatric: Patient has normal mood and affect    Ortho Exam: Orthopedic exam demonstrates increased body mass index with antalgic gait to the left.  Trace effusion is present.  Medial greater than lateral joint line tenderness is present.  Pedal pulses palpable.  Range of motion is full.  No other masses lymphadenopathy or skin changes noted in that left knee region  Specialty Comments:  No specialty comments available.  Imaging: No results found.   PMFS History: Patient Active Problem List  Diagnosis Date Noted  . Acute pain of left knee 01/15/2018  . Hyperlipidemia 12/10/2017  . Diabetes (HCC) 11/18/2017  . Depression 02/08/2017  . Chronic low back pain 02/08/2017  . Asthma 02/08/2017  . Psoriasis 02/08/2017  . Health care maintenance 02/08/2017   Past Medical History:  Diagnosis Date  . Bronchitis   . Chronic back pain   . Depression   . Osteoarthritis     Family History  Problem Relation Age of Onset  . Cancer Father   . Heart disease Paternal Grandfather   . Stroke Paternal Grandfather   . Drug abuse Paternal Grandfather   . High Cholesterol Mother   . Diabetes  Maternal Aunt   . Hypertension Maternal Aunt   . Diabetes Maternal Uncle     Past Surgical History:  Procedure Laterality Date  . CESAREAN SECTION    . CHOLECYSTECTOMY    . TUBAL LIGATION     Social History   Occupational History  . Not on file  Tobacco Use  . Smoking status: Former Smoker    Last attempt to quit: 07/31/2007    Years since quitting: 10.5  . Smokeless tobacco: Never Used  Substance and Sexual Activity  . Alcohol use: No  . Drug use: No  . Sexual activity: Yes    Birth control/protection: Surgical

## 2018-02-10 ENCOUNTER — Telehealth: Payer: Self-pay | Admitting: Physical Therapy

## 2018-02-10 ENCOUNTER — Ambulatory Visit: Payer: No Typology Code available for payment source | Attending: Family Medicine | Admitting: Physical Therapy

## 2018-02-10 NOTE — Telephone Encounter (Signed)
No-show #2. Attempted to call patient but again unsure if this is the correct number on file, did not leave message. Unable to reach patient.  Nedra HaiKristen Elpidio Thielen PT, DPT, CBIS  Supplemental Physical Therapist [facility]   Pager [phone]

## 2018-02-10 NOTE — Telephone Encounter (Signed)
Called and LMOM asking Melanee LeftKristina Henderson to please call clinic back.  Nedra HaiKristen Unger PT, DPT, CBIS  Supplemental Physical Therapist [facility]   Pager [phone]

## 2018-02-12 ENCOUNTER — Encounter: Payer: Self-pay | Admitting: Physical Therapy

## 2018-02-12 ENCOUNTER — Telehealth: Payer: Self-pay | Admitting: Physical Therapy

## 2018-02-12 ENCOUNTER — Ambulatory Visit: Payer: No Typology Code available for payment source | Admitting: Physical Therapy

## 2018-02-12 NOTE — Telephone Encounter (Signed)
3rd consecutive no-show. Called and LMOM on machine regarding policy regarding no-shows and that she is being DCed today due to 3 no shows in a row. She will need new MD order to continue with PT.  Nedra HaiKristen Emmilee Reamer PT, DPT, CBIS  Supplemental Physical Therapist [facility]   Pager [phone]

## 2018-02-12 NOTE — Therapy (Signed)
[place], [place], [address] Phone: [phone]   Fax:  [phone]  Patient Details  Name: Amanda Larson MRN: [id number] Date of Birth: Dec 15, 1965 Referring Provider:  No ref. provider found  Encounter Date: 02/12/2018   PHYSICAL THERAPY DISCHARGE SUMMARY  Visits from Start of Care: 9  Current functional level related to goals / functional outcomes: She has not returned since last scheduled session, and has had 3 consecutive no-shows. DC per policy.    Remaining deficits: Unable to assess    Education / Equipment: N/A  Plan: Patient agrees to discharge.  Patient goals were partially met. Patient is being discharged due to not returning since the last visit.  ?????       Deniece Ree PT, DPT, CBIS  Supplemental Physical Therapist [place]   Pager [facility] [address] [place], [place], [address] Phone: [phone]   Fax:  [phone]

## 2018-02-17 ENCOUNTER — Ambulatory Visit: Payer: No Typology Code available for payment source | Admitting: Physical Therapy

## 2018-02-19 ENCOUNTER — Ambulatory Visit: Payer: Self-pay | Admitting: Internal Medicine

## 2018-02-19 ENCOUNTER — Encounter: Payer: PRIVATE HEALTH INSURANCE | Admitting: Family Medicine

## 2018-02-20 ENCOUNTER — Encounter: Payer: Self-pay | Admitting: Physical Therapy

## 2018-02-23 ENCOUNTER — Other Ambulatory Visit: Payer: Self-pay | Admitting: Internal Medicine

## 2018-02-27 ENCOUNTER — Ambulatory Visit (INDEPENDENT_AMBULATORY_CARE_PROVIDER_SITE_OTHER): Payer: PRIVATE HEALTH INSURANCE | Admitting: Internal Medicine

## 2018-02-27 ENCOUNTER — Other Ambulatory Visit: Payer: Self-pay

## 2018-02-27 ENCOUNTER — Encounter: Payer: Self-pay | Admitting: Internal Medicine

## 2018-02-27 VITALS — BP 110/70 | HR 83 | Temp 98.0°F | Ht 61.0 in | Wt 310.0 lb

## 2018-02-27 DIAGNOSIS — E1100 Type 2 diabetes mellitus with hyperosmolarity without nonketotic hyperglycemic-hyperosmolar coma (NKHHC): Secondary | ICD-10-CM

## 2018-02-27 DIAGNOSIS — Z794 Long term (current) use of insulin: Secondary | ICD-10-CM

## 2018-02-27 LAB — POCT GLYCOSYLATED HEMOGLOBIN (HGB A1C): HbA1c, POC (controlled diabetic range): 7.1 % — AB (ref 0.0–7.0)

## 2018-02-27 NOTE — Progress Notes (Signed)
   [facility] Phone: [phone]  Subjective:  Amanda Larson is a 52 year old female presenting to clinic for follow-up of her diabetes. She is checking her CBGs once daily in the morning. They have mostly been in the 160s. No lows. No polyuria or polydipsia. She is taking Metformin 1,000mg  bid. No nausea, no diarrhea, no abdominal pain. She has been cutting back on sweets and candy bars.  ROS: See HPI for pertinent positives and negatives  Past Medical History- asthma, T2DM, psoriasis, chronic low back pain, depression, HLD  Family history reviewed for today's visit. No changes.  Social history- patient is a former smoker  Objective: BP 110/70 (BP Location: Left Arm, Patient Position: Sitting, Cuff Size: Large)   Pulse 83   Temp 98 F (36.7 C) (Oral)   Ht 5\' 1"  (1.549 m)   Wt (!) 310 lb (140.6 kg)   LMP 01/17/2014   SpO2 99%   BMI 58.57 kg/m  Gen: NAD, alert, cooperative with exam HEENT: NCAT, EOMI, MMM Neck: FROM, supple CV: RRR, no murmur Resp: CTABL, no wheezes, normal work of breathing  Assessment/Plan: T2DM: Well-controlled. A1c improved from 8.0 on 11/18/2017 to 7.1 today. - Continue Metformin 1,000mg  bid - Continue checking CBGs once daily - Follow-up in 6 months to recheck A1c   Amanda CarolKaty Earsie Humm, MD PGY-3

## 2018-02-27 NOTE — Patient Instructions (Signed)
It was wonderful to see you today!  You are doing a great job with your diabetes! Your A1c decreased from 8.0 at your last visit to 7.1 today! Keep up the great work!  We will see you back in 6 months to recheck your A1c.  -Dr. Nancy MarusMayo

## 2018-03-04 NOTE — Assessment & Plan Note (Signed)
Well-controlled. A1c improved from 8.0 on 11/18/2017 to 7.1 today. - Continue Metformin 1,000mg  bid - Continue checking CBGs once daily - Follow-up in 6 months to recheck A1c

## 2018-03-05 ENCOUNTER — Other Ambulatory Visit: Payer: Self-pay | Admitting: Internal Medicine

## 2018-03-11 ENCOUNTER — Other Ambulatory Visit: Payer: Self-pay | Admitting: Internal Medicine

## 2018-03-14 NOTE — Progress Notes (Signed)
Opened in error

## 2018-04-01 ENCOUNTER — Other Ambulatory Visit: Payer: Self-pay | Admitting: *Deleted

## 2018-04-02 MED ORDER — TRAMADOL HCL 50 MG PO TABS: 50.0000 mg | ORAL_TABLET | Freq: Two times a day (BID) | ORAL | 0 refills | Status: DC

## 2018-04-02 NOTE — Telephone Encounter (Signed)
Rx printed, up front for pick up

## 2018-04-02 NOTE — Telephone Encounter (Signed)
LM for patient that script is ready for pick up. Yarixa Lightcap,CMA  

## 2018-04-09 ENCOUNTER — Telehealth: Payer: Self-pay | Admitting: Family Medicine

## 2018-04-09 NOTE — Telephone Encounter (Signed)
Omaprezole, cholesterol medication is not covered by medicaid so can she be prescribed something or authorize it somehow to get it covered by medicaid?  [facility] [address] is her pharmacy.

## 2018-04-10 NOTE — Telephone Encounter (Signed)
LM for patient to call back.  Please verify which medication she needs us to change.  The initial message said omeprazole but this is not for cholesterol as it was documented.  We need clarification on whether patient needs her omeprazole for acid reflux of her atorvastatin for cholesterol.  Thanks Limited BrandsJazmin Bruna Dills,CMA

## 2018-04-21 ENCOUNTER — Other Ambulatory Visit: Payer: Self-pay | Admitting: *Deleted

## 2018-04-22 NOTE — Telephone Encounter (Signed)
2nd request. Arbell Wycoff,CMA  

## 2018-04-23 MED ORDER — METFORMIN HCL 1000 MG PO TABS: 1000.0000 mg | ORAL_TABLET | Freq: Two times a day (BID) | ORAL | 0 refills | Status: DC

## 2018-05-14 ENCOUNTER — Ambulatory Visit (INDEPENDENT_AMBULATORY_CARE_PROVIDER_SITE_OTHER): Payer: Medicaid Other | Admitting: Family Medicine

## 2018-05-14 ENCOUNTER — Encounter: Payer: Self-pay | Admitting: Family Medicine

## 2018-05-14 ENCOUNTER — Other Ambulatory Visit: Payer: Self-pay

## 2018-05-14 VITALS — BP 115/75 | HR 87 | Temp 98.0°F | Wt 306.0 lb

## 2018-05-14 DIAGNOSIS — E785 Hyperlipidemia, unspecified: Secondary | ICD-10-CM

## 2018-05-14 DIAGNOSIS — M1712 Unilateral primary osteoarthritis, left knee: Secondary | ICD-10-CM

## 2018-05-14 DIAGNOSIS — K219 Gastro-esophageal reflux disease without esophagitis: Secondary | ICD-10-CM

## 2018-05-14 DIAGNOSIS — Z1231 Encounter for screening mammogram for malignant neoplasm of breast: Secondary | ICD-10-CM

## 2018-05-14 DIAGNOSIS — F339 Major depressive disorder, recurrent, unspecified: Secondary | ICD-10-CM | POA: Diagnosis not present

## 2018-05-14 DIAGNOSIS — Z1239 Encounter for other screening for malignant neoplasm of breast: Secondary | ICD-10-CM

## 2018-05-14 MED ORDER — RANITIDINE HCL 150 MG PO CAPS: 150.0000 mg | ORAL_CAPSULE | Freq: Two times a day (BID) | ORAL | 3 refills | Status: DC

## 2018-05-14 MED ORDER — SIMVASTATIN 40 MG PO TABS: 40.0000 mg | ORAL_TABLET | Freq: Every day | ORAL | 3 refills | Status: DC

## 2018-05-14 MED ORDER — DICLOFENAC SODIUM 1 % TD GEL: 4.0000 g | Freq: Four times a day (QID) | TRANSDERMAL | 3 refills | Status: DC

## 2018-05-14 MED ORDER — CITALOPRAM HYDROBROMIDE 40 MG PO TABS: 40.0000 mg | ORAL_TABLET | Freq: Every day | ORAL | 3 refills | Status: DC

## 2018-05-14 MED ORDER — ALBUTEROL SULFATE HFA 108 (90 BASE) MCG/ACT IN AERS: 2.0000 | INHALATION_SPRAY | Freq: Four times a day (QID) | RESPIRATORY_TRACT | 2 refills | Status: DC | PRN

## 2018-05-14 NOTE — Patient Instructions (Addendum)
Good to see you today! Please have any disability forms sent to me. Let me know if you have issues getting medication. You will be called to schedule a mammogram at your convenience.  I'd like to see you back in 1 month to follow up. If you have questions or concerns please do not hesitate to call at [phone].  Dolores Patty, DO PGY-3, [facility] 05/14/2018 10:55 AM   Arthritis Arthritis is a term that is commonly used to refer to joint pain or joint disease. There are more than 100 types of arthritis. What are the causes? The most common cause of this condition is wear and tear of a joint. Other causes include:  Gout.  Inflammation of a joint.  An infection of a joint.  Sprains and other injuries near the joint.  A drug reaction or allergic reaction.  In some cases, the cause may not be known. What are the signs or symptoms? The main symptom of this condition is pain in the joint with movement. Other symptoms include:  Redness, swelling, or stiffness at a joint.  Warmth coming from the joint.  Fever.  Overall feeling of illness.  How is this diagnosed? This condition may be diagnosed with a physical exam and tests, including:  Blood tests.  Urine tests.  Imaging tests, such as MRI, X-rays, or a CT scan.  Sometimes, fluid is removed from a joint for testing. How is this treated? Treatment for this condition may involve:  Treatment of the cause, if it is known.  Rest.  Raising (elevating) the joint.  Applying cold or hot packs to the joint.  Medicines to improve symptoms and reduce inflammation.  Injections of a steroid such as cortisone into the joint to help reduce pain and inflammation.  Depending on the cause of your arthritis, you may need to make lifestyle changes to reduce stress on your joint. These changes may include exercising more and losing weight. Follow these instructions at home: Medicines  Take over-the-counter  and prescription medicines only as told by your health care provider.  Do not take aspirin to relieve pain if gout is suspected. Activity  Rest your joint if told by your health care provider. Rest is important when your disease is active and your joint feels painful, swollen, or stiff.  Avoid activities that make the pain worse. It is important to balance activity with rest.  Exercise your joint regularly with range-of-motion exercises as told by your health care provider. Try doing low-impact exercise, such as: ? Swimming. ? Water aerobics. ? Biking. ? Walking. Joint Care   If your joint is swollen, keep it elevated if told by your health care provider.  If your joint feels stiff in the morning, try taking a warm shower.  If directed, apply heat to the joint. If you have diabetes, do not apply heat without permission from your health care provider. ? Put a towel between the joint and the hot pack or heating pad. ? Leave the heat on the area for 20-30 minutes.  If directed, apply ice to the joint: ? Put ice in a plastic bag. ? Place a towel between your skin and the bag. ? Leave the ice on for 20 minutes, 2-3 times per day.  Keep all follow-up visits as told by your health care provider. This is important. Contact a health care provider if:  The pain gets worse.  You have a fever. Get help right away if:  You develop severe joint  pain, swelling, or redness.  Many joints become painful and swollen.  You develop severe back pain.  You develop severe weakness in your leg.  You cannot control your bladder or bowels. This information is not intended to replace advice given to you by your health care provider. Make sure you discuss any questions you have with your health care provider. Document Released: 10/04/2004 Document Revised: 02/02/2016 Document Reviewed: 11/22/2014 Elsevier Interactive Patient Education  [employer].

## 2018-05-14 NOTE — Progress Notes (Signed)
Subjective:    Patient ID: Amanda Larson, female    DOB: 05/10/1966, 52 y.o.   MRN: [id number]   CC: med refills  Patient reports she is coming in today because she has filed for disability and was told to see her PCP for evaluation. She has had trouble making appointments recently because her medicaid lapsed and she was unable to afford coming in. She also has been out of several medications for about a month. She is out of lipitor, prilosec, and tramadol. She is taking metformin and celexa still. She reports her mood is okay but there is room for improvement. She cites a lot of stressors in life including drug addict homeless daughter. She has taken in her granddaughters and is caring for them now. She has financial strain. She reports she use to be on 40 mg celexa but dose was reduced. She would like to go back up on the dose. She reports she has severe knee and back pain. She was told she could not get knee surgery due to weight and plaque psoriasis over knee joint. She does not want to resume tramadol as she felt like she was addicted to it and had a hard time once she ran out of it. She is taking tylenol for pain. She ran out of voltaren gel but would like to resume this. She walks her dog for exercise but the walks are not very long as her knee pain limits her exercise. Knee will swell up after activity.   Smoking status reviewed- former smoker  Review of Systems- denies chest pain, DOE, SOB, palpitations, headaches, vision changes, SI/HI   Objective:  BP 115/75   Pulse 87   Temp 98 F (36.7 C) (Oral)   Wt (!) 306 lb (138.8 kg)   LMP 01/17/2014   SpO2 96%   BMI 57.82 kg/m  Vitals and nursing note reviewed  General: well nourished, in no acute distress HEENT: normocephalic, MMM Neck: supple, non-tender, without lymphadenopathy Cardiac: RRR, clear S1 and S2, no murmurs, rubs, or gallops Respiratory: clear to auscultation bilaterally, no increased work of  breathing Extremities: no edema or cyanosis Skin: warm and dry, no rashes noted Neuro: alert and oriented, no focal deficits Psych: mood reported as depressed, affect is normal  Assessment & Plan:    Hyperlipidemia  Patient out of statin for >1 month. Will refill statin but [facility] at [facility] only offers simvastatin 40 mg. Will send that in and check lipid panel today as well as CMP.   Gastro-esophageal reflux  Patient previously taking prilosec as needed but ran out. Discussed mechanism of action and long term effects of PPI. Will send in ranitdine to pharmacy instead as this is on 4 dollar list and would be a safer as needed medication for patient to take.   Depression  Patient reports long history of depression, no SI/HI, however she feels her celexa is not strong enough. She used to be on 40 and was doing well so dose was lowered to 20 mg. She would like to go back up to 40 mg. rx sent to pharmacy. Follow up 1 month for recheck  Osteoarthritis of left knee  Patient was seen by ortho (Dr. August Saucer) and was told she is not a candidate for surgery at this time. We had a long discussion about knee management. She does not want to be on any medications that she would be dependent on. We discussed exercise and weight loss to help overall  with her health. Continue tylenol scheduled 1000 mg TID. Refilled voltaren gel to use QID. Patient has a hard time with oral nsaids due to reflux.     Return in about 4 weeks (around 06/11/2018).   Dolores Patty, DO Family Medicine Resident PGY-3

## 2018-05-15 ENCOUNTER — Encounter: Payer: Self-pay | Admitting: Family Medicine

## 2018-05-15 ENCOUNTER — Telehealth: Payer: Self-pay

## 2018-05-15 DIAGNOSIS — K219 Gastro-esophageal reflux disease without esophagitis: Secondary | ICD-10-CM | POA: Insufficient documentation

## 2018-05-15 DIAGNOSIS — M1712 Unilateral primary osteoarthritis, left knee: Secondary | ICD-10-CM | POA: Insufficient documentation

## 2018-05-15 LAB — CMP14+EGFR
ALBUMIN: 4.6 g/dL (ref 3.5–5.5)
ALK PHOS: 78 IU/L (ref 39–117)
ALT: 48 IU/L — AB (ref 0–32)
AST: 34 IU/L (ref 0–40)
Albumin/Globulin Ratio: 1.6 (ref 1.2–2.2)
BUN/Creatinine Ratio: 24 — ABNORMAL HIGH (ref 9–23)
BUN: 17 mg/dL (ref 6–24)
Bilirubin Total: 0.4 mg/dL (ref 0.0–1.2)
CHLORIDE: 101 mmol/L (ref 96–106)
CO2: 22 mmol/L (ref 20–29)
Calcium: 10 mg/dL (ref 8.7–10.2)
Creatinine, Ser: 0.7 mg/dL (ref 0.57–1.00)
GFR calc Af Amer: 115 mL/min/{1.73_m2} (ref >59)
GFR calc non Af Amer: 100 mL/min/{1.73_m2} (ref >59)
GLUCOSE: 177 mg/dL — AB (ref 65–99)
Globulin, Total: 2.8 g/dL (ref 1.5–4.5)
Potassium: 4.6 mmol/L (ref 3.5–5.2)
Sodium: 142 mmol/L (ref 134–144)
Total Protein: 7.4 g/dL (ref 6.0–8.5)

## 2018-05-15 LAB — CBC
HEMATOCRIT: 42.2 % (ref 34.0–46.6)
HEMOGLOBIN: 13.5 g/dL (ref 11.1–15.9)
MCH: 27.4 pg (ref 26.6–33.0)
MCHC: 32 g/dL (ref 31.5–35.7)
MCV: 86 fL (ref 79–97)
Platelets: 230 10*3/uL (ref 150–450)
RBC: 4.92 x10E6/uL (ref 3.77–5.28)
RDW: 14.7 % (ref 12.3–15.4)
WBC: 8.1 10*3/uL (ref 3.4–10.8)

## 2018-05-15 LAB — LIPID PANEL
CHOL/HDL RATIO: 6 ratio — AB (ref 0.0–4.4)
Cholesterol, Total: 181 mg/dL (ref 100–199)
HDL: 30 mg/dL — ABNORMAL LOW (ref >39)
LDL Calculated: 106 mg/dL — ABNORMAL HIGH (ref 0–99)
Triglycerides: 224 mg/dL — ABNORMAL HIGH (ref 0–149)
VLDL CHOLESTEROL CAL: 45 mg/dL — AB (ref 5–40)

## 2018-05-15 NOTE — Telephone Encounter (Signed)
-----   Message from Tillman Sers, DO sent at 05/15/2018  1:14 PM EDT ----- Please let patient know labs look stable, no changes to make for her medications. Thanks!

## 2018-05-15 NOTE — Telephone Encounter (Signed)
Pt contacted and informed of stable labs and no need to change her medication regime at this time.

## 2018-05-15 NOTE — Assessment & Plan Note (Signed)
  Patient out of statin for >1 month. Will refill statin but [facility] list at [facility] only offers simvastatin 40 mg. Will send that in and check lipid panel today as well as CMP.

## 2018-05-15 NOTE — Assessment & Plan Note (Addendum)
  Patient reports long history of depression, no SI/HI, however she feels her celexa is not strong enough. She used to be on 40 and was doing well so dose was lowered to 20 mg. She would like to go back up to 40 mg. rx sent to pharmacy. Follow up 1 month for recheck

## 2018-05-15 NOTE — Progress Notes (Signed)
Please let patient know labs look stable, no changes to make for her medications. Thanks!

## 2018-05-15 NOTE — Assessment & Plan Note (Signed)
  Patient was seen by ortho (Dr. August Saucer) and was told she is not a candidate for surgery at this time. We had a long discussion about knee management. She does not want to be on any medications that she would be dependent on. We discussed exercise and weight loss to help overall with her health. Continue tylenol scheduled 1000 mg TID. Refilled voltaren gel to use QID. Patient has a hard time with oral nsaids due to reflux.

## 2018-05-15 NOTE — Assessment & Plan Note (Signed)
  Patient previously taking prilosec as needed but ran out. Discussed mechanism of action and long term effects of PPI. Will send in ranitdine to pharmacy instead as this is on 4 dollar list and would be a safer as needed medication for patient to take.

## 2018-05-15 NOTE — Telephone Encounter (Signed)
Received PA for ranitidine rx. Per [employer] will cover ranitidine tabs but not caps. Rx for tabs called [facility] to [facility]. Shawna Orleans, RN

## 2018-06-05 ENCOUNTER — Other Ambulatory Visit: Payer: Self-pay

## 2018-06-20 ENCOUNTER — Other Ambulatory Visit: Payer: Self-pay

## 2018-06-23 ENCOUNTER — Other Ambulatory Visit: Payer: Self-pay | Admitting: *Deleted

## 2018-06-23 MED ORDER — CITALOPRAM HYDROBROMIDE 40 MG PO TABS: 40.0000 mg | ORAL_TABLET | Freq: Every day | ORAL | 3 refills | Status: DC

## 2018-06-23 NOTE — Telephone Encounter (Signed)
Please have her come in for follow up on mood and other issues. Will refill  Thanks!

## 2018-06-23 NOTE — Telephone Encounter (Signed)
Pt states that she was just in there and she "does not need another appt".  She also wonder why her diclofenac medication was not refilled.  Advised that I could check with provider.   Dr. Wonda Olds, I see where the diclofenac was denied, but the only reason was refill not appropriate.  Please advise.  Trayton Szabo, Maryjo Rochester, CMA

## 2018-06-24 NOTE — Telephone Encounter (Signed)
At last visit she had said the oral diclofenac was not working and was upsetting her stomach so we were using topical diclofenac gel instead. I had wanted her to follow up to make sure mood is improving as we increased her antidepressant at last visit. I know cost is an issue for her and may be a barrier to her coming in.

## 2018-07-31 ENCOUNTER — Other Ambulatory Visit: Payer: Self-pay | Admitting: Family Medicine

## 2018-07-31 MED ORDER — METFORMIN HCL 1000 MG PO TABS: 1000.0000 mg | ORAL_TABLET | Freq: Two times a day (BID) | ORAL | 0 refills | Status: DC

## 2018-07-31 NOTE — Telephone Encounter (Signed)
Pt came in office requesting a Rx refill on her Metformin. Stated she only have one pill left. Best phone # to contact is [phone]

## 2018-08-14 ENCOUNTER — Other Ambulatory Visit: Payer: Self-pay | Admitting: Internal Medicine

## 2018-08-15 ENCOUNTER — Ambulatory Visit: Payer: Self-pay | Admitting: Family Medicine

## 2018-08-22 ENCOUNTER — Ambulatory Visit (INDEPENDENT_AMBULATORY_CARE_PROVIDER_SITE_OTHER): Payer: Medicaid Other | Admitting: Licensed Clinical Social Worker

## 2018-08-22 ENCOUNTER — Other Ambulatory Visit: Payer: Self-pay

## 2018-08-22 ENCOUNTER — Encounter: Payer: Self-pay | Admitting: Family Medicine

## 2018-08-22 ENCOUNTER — Ambulatory Visit (INDEPENDENT_AMBULATORY_CARE_PROVIDER_SITE_OTHER): Payer: Self-pay | Admitting: Family Medicine

## 2018-08-22 ENCOUNTER — Telehealth: Payer: Self-pay

## 2018-08-22 VITALS — BP 104/62 | HR 82 | Temp 98.1°F | Wt 307.0 lb

## 2018-08-22 DIAGNOSIS — E119 Type 2 diabetes mellitus without complications: Secondary | ICD-10-CM

## 2018-08-22 DIAGNOSIS — F339 Major depressive disorder, recurrent, unspecified: Secondary | ICD-10-CM

## 2018-08-22 DIAGNOSIS — G8929 Other chronic pain: Secondary | ICD-10-CM

## 2018-08-22 DIAGNOSIS — Z1239 Encounter for other screening for malignant neoplasm of breast: Secondary | ICD-10-CM

## 2018-08-22 DIAGNOSIS — M1712 Unilateral primary osteoarthritis, left knee: Secondary | ICD-10-CM

## 2018-08-22 DIAGNOSIS — L409 Psoriasis, unspecified: Secondary | ICD-10-CM

## 2018-08-22 DIAGNOSIS — R0683 Snoring: Secondary | ICD-10-CM

## 2018-08-22 DIAGNOSIS — K029 Dental caries, unspecified: Secondary | ICD-10-CM

## 2018-08-22 DIAGNOSIS — M545 Low back pain: Secondary | ICD-10-CM

## 2018-08-22 DIAGNOSIS — G4452 New daily persistent headache (NDPH): Secondary | ICD-10-CM

## 2018-08-22 LAB — POCT GLYCOSYLATED HEMOGLOBIN (HGB A1C): HbA1c, POC (controlled diabetic range): 6.4 % (ref 0.0–7.0)

## 2018-08-22 MED ORDER — FAMOTIDINE 20 MG PO TABS: 20.0000 mg | ORAL_TABLET | Freq: Two times a day (BID) | ORAL | 2 refills | Status: DC

## 2018-08-22 MED ORDER — DICLOFENAC SODIUM 75 MG PO TBEC: 75.0000 mg | DELAYED_RELEASE_TABLET | Freq: Two times a day (BID) | ORAL | 1 refills | Status: DC

## 2018-08-22 MED ORDER — ALBUTEROL SULFATE HFA 108 (90 BASE) MCG/ACT IN AERS: 2.0000 | INHALATION_SPRAY | Freq: Four times a day (QID) | RESPIRATORY_TRACT | 2 refills | Status: DC | PRN

## 2018-08-22 MED ORDER — NAPROXEN 500 MG PO TABS: 500.0000 mg | ORAL_TABLET | Freq: Two times a day (BID) | ORAL | 1 refills | Status: DC

## 2018-08-22 MED ORDER — CIMETIDINE 400 MG PO TABS: 400.0000 mg | ORAL_TABLET | Freq: Two times a day (BID) | ORAL | 3 refills | Status: DC

## 2018-08-22 NOTE — Patient Instructions (Addendum)
  Good to see you today.  Sugars looked good, let's cut the metformin in half.   I have referred you to: Ortho for your knee (Dr. August Saucerean) Rheumatology for psoriasis Dentist Mammogram Sleep study  You will be called to make these appointments.  Please follow up in 1-2 months to check in.   If you have questions or concerns please do not hesitate to call at [phone].  Dolores PattyAngela Analiah Drum, DO PGY-3, [facility] 08/22/2018 11:49 AM

## 2018-08-22 NOTE — Telephone Encounter (Signed)
Amanda Larson from [facility] called. They do not stock Diclofenac or Cimetidine.  Alternatives they have include: Ibuprofen 800 mg, Meloxicam 15 mg, Naprosyn 500 mg, and Famotidine 20 mg.  Please call them at [phone]  Ples SpecterAlisa Brake, RN [facility] RN)

## 2018-08-22 NOTE — BH Specialist Note (Signed)
Integrated Behavioral Health Initial Visit  MRN: [id number] Name: Amanda Larson  Session Start time: 11:45  Session End time: 12:10 Total time: 20 minutes Type of Service: Integrated Behavioral Health  Warm Hand Off Completed.     Patient  verbally consented to meet with [facility] about presenting concerns. SUBJECTIVE: Amanda Larson is a 52 y.o. female referred by Dr. Cherie Darkicco for assistance with getting connected to mental health resources.  Report of symptoms: crying, anger, short tempered, irritable, yelling at people.  Has been on depression medication for over 10 years does not believe is it working for her.     ASSESSMENT: Mood: Anxious, Depressed and Irritable and Affect: Tearful at times Patient is currently experiencing symptoms of depression which are exacerbated by financial and other stressors. Patient's children have diagnosis of Bipolar, ADD and ADHD.  Based on patient's behavioral concerns she may benefit from and is in agreement to receive further assessment and therapeutic intervention with [facility] to assist with managing her symptoms.  GOALS: Patient will: 1. Reduce symptoms of: depression and mood instability 2. Increase knowledge and/or ability of: coping skills, self-management skills and stress reduction  PL[facility] _______________________________________________________  Duration of CURRENT symptoms:over 10 years however have continued to increase Impact on function:difficulty with daily functions with family and out in public   Psychiatric History - Diagnoses:depression  -- Pharmacotherapy: Celexa - Outpatient therapy: none  Family history of psychiatric issues: yes, Bipolar, ADD and ADHD  Current and history of substance use: denies current use, hx of alcohol and drug use Risk of harm to self or others: No plan to harm self or others  LIFE CONTEXT: Family and Social: lives with 2 teenage  grandchildren; support from parents and ex-husband School/Work: applying for disability  Life Changes: no income and financial stressors  INTERVENTION:  Mindfulness or Management consultantelaxation Training,  Psychoeducation and Referral to [facility] provider    Sammuel Hineseborah , Kentucky[place]CSW Behavioral Health Clinician [facility]   [phone] 12:35 PM

## 2018-08-22 NOTE — Progress Notes (Signed)
    Subjective:    Patient ID: Amanda Larson, female    DOB: 1965/12/29, 52 y.o.   MRN: [id number]   CC: back pain, diabetes, headaches  Back pain- chronic, out of voltaren. Using tylenol with minimal relief.   T2DM- last A1C in June, 7.1. on metformin 1000 mg BID. Tolerating this well. No increased thirst, hunger, or urination  Headaches- headache upon awakening, persistent. Taking tylenol with some relief. Report snoring. Cannot fall asleep at night. Sleep makes headache better. No focal weakness or other neurologic changes. Patient reports she snores.   Depression- would like to speak with behavioral health today. No SI/HI.  Also needs mammogram and to see a dentist.  Smoking status reviewed- former smoker  Review of Systems- no fevers or chills, no unintentional weight loss, no constipation, diarrhea, nausea, vomiting.    Objective:  BP 104/62   Pulse 82   Temp 98.1 F (36.7 C) (Oral)   Wt (!) 307 lb (139.3 kg)   LMP 01/17/2014   SpO2 94%   BMI 58.01 kg/m  Vitals and nursing note reviewed  General: obese, in no acute distress HEENT: normocephalic, MMM Cardiac: RRR, clear S1 and S2, no murmurs, rubs, or gallops Respiratory: clear to auscultation bilaterally, no increased work of breathing Extremities: no edema Skin: warm and dry. Diffuse erythematous plaques over extremities, trunk  Neuro: alert and oriented, no focal deficits   Assessment & Plan:    1. Type 2 diabetes mellitus without complication, without long-term current use of insulin (HCC) A1C today improved to 6.4. will half dosage of metformin, follow up 3 months to recheck - HgB A1c  2. Chronic bilateral low back pain without sciatica rx for voltaren sent to [facility]- received call they only carry naproxen which I verbally okayed for her to take for back pain  3. Recurrent major depressive disorder, remission status unspecified ([place]) Patient met with [facility] today and was given information to follow up  with [facility], see their note for full details.  4. Osteoarthritis of left knee, unspecified osteoarthritis type Referral to ortho made - Ambulatory referral to Orthopedics  5. Psoriasis - Ambulatory referral to Rheumatology  6. Screening for breast cancer - MM Digital Screening; Future  7. Dental caries - Ambulatory referral to Dentistry  8. Snoring - Split night study; Future  9. New daily persistent headache - Split night study; Future   Return in about 4 weeks (around 09/19/2018).   Lucila Maine, DO Family Medicine Resident PGY-3

## 2018-08-22 NOTE — Telephone Encounter (Signed)
  Unable to reach them but electronically ordered those medications.   Thanks  Dolores PattyAngela Kunaal Walkins, DO PGY-3, [facility] 08/22/2018 2:17 PM

## 2018-09-17 ENCOUNTER — Ambulatory Visit (INDEPENDENT_AMBULATORY_CARE_PROVIDER_SITE_OTHER): Payer: No Typology Code available for payment source | Admitting: Orthopedic Surgery

## 2018-10-13 ENCOUNTER — Encounter (HOSPITAL_BASED_OUTPATIENT_CLINIC_OR_DEPARTMENT_OTHER): Payer: Self-pay

## 2018-10-21 ENCOUNTER — Other Ambulatory Visit (HOSPITAL_COMMUNITY): Payer: Self-pay | Admitting: *Deleted

## 2018-10-21 DIAGNOSIS — M79622 Pain in left upper arm: Secondary | ICD-10-CM

## 2018-10-23 ENCOUNTER — Ambulatory Visit (HOSPITAL_BASED_OUTPATIENT_CLINIC_OR_DEPARTMENT_OTHER): Payer: Self-pay | Attending: Family Medicine | Admitting: Internal Medicine

## 2018-10-23 VITALS — Ht 61.5 in | Wt 306.0 lb

## 2018-10-23 DIAGNOSIS — R0683 Snoring: Secondary | ICD-10-CM

## 2018-10-23 DIAGNOSIS — G4452 New daily persistent headache (NDPH): Secondary | ICD-10-CM

## 2018-10-23 DIAGNOSIS — G4733 Obstructive sleep apnea (adult) (pediatric): Secondary | ICD-10-CM | POA: Insufficient documentation

## 2018-11-01 DIAGNOSIS — R0683 Snoring: Secondary | ICD-10-CM

## 2018-11-01 NOTE — Procedures (Signed)
Patient Name: Amanda Larson, Amanda Larson Date: 10/23/2018 Gender: Female D.O.B: Feb 14, 1966 Age (years): 52 Referring Provider: Esmond Camper Height (inches): 11 Interpreting Physician: Baird Lyons MD, ABSM Weight (lbs): 306 RPSGT: Laren Everts BMI: 57 MRN: [id number] Neck Size: 17.50  CLINICAL INFORMATION Sleep Study Type: Split Night CPAP Indication for sleep study: Diabetes, Fatigue, Morning Headaches, Obesity, Snoring Epworth Sleepiness Score: 8  SLEEP STUDY TECHNIQUE As per the AASM Manual for the Scoring of Sleep and Associated Events v2.3 (April 2016) with a hypopnea requiring 4% desaturations.  The channels recorded and monitored were frontal, central and occipital EEG, electrooculogram (EOG), submentalis EMG (chin), nasal and oral airflow, thoracic and abdominal wall motion, anterior tibialis EMG, snore microphone, electrocardiogram, and pulse oximetry. Continuous positive airway pressure (CPAP) was initiated when the patient met split night criteria and was titrated according to treat sleep-disordered breathing.  MEDICATIONS Medications self-administered by patient taken the night of the study : HYDROXYZINE HCL, NAPROXEN, FAMOTIDINE  RESPIRATORY PARAMETERS Diagnostic  Total AHI (/hr): 69.9 RDI (/hr): 75.2 OA Index (/hr): 1.3 CA Index (/hr): 0.0 REM AHI (/hr): N/A NREM AHI (/hr): 69.9 Supine AHI (/hr): 35.2 Non-supine AHI (/hr): 116.9 Min O2 Sat (%): 83.0 Mean O2 (%): 90.9 Time below 88% (min): 16.3   Titration  Optimal Pressure (cm): 16 AHI at Optimal Pressure (/hr): 0.0 Min O2 at Optimal Pressure (%): 90.0 Supine % at Optimal (%): 100 Sleep % at Optimal (%): 52   SLEEP ARCHITECTURE The recording time for the entire night was 423.3 minutes.  During a baseline period of 186.7 minutes, the patient slept for 136.5 minutes in REM and nonREM, yielding a sleep efficiency of 73.1%%. Sleep onset after lights out was 20.7 minutes with a REM latency of N/A minutes.  The patient spent 15.8%% of the night in stage N1 sleep, 84.2%% in stage N2 sleep, 0.0%% in stage N3 and 0% in REM.  During the titration period of 229.0 minutes, the patient slept for 133.5 minutes in REM and nonREM, yielding a sleep efficiency of 58.3%%. Sleep onset after CPAP initiation was 19.4 minutes with a REM latency of 154.0 minutes. The patient spent 13.5%% of the night in stage N1 sleep, 62.5%% in stage N2 sleep, 0.0%% in stage N3 and 24% in REM.  CARDIAC DATA The 2 lead EKG demonstrated sinus rhythm. The mean heart rate was 100.0 beats per minute. Other EKG findings include: None.  LEG MOVEMENT DATA The total Periodic Limb Movements of Sleep (PLMS) were 0. The PLMS index was 0.0 .  IMPRESSIONS - Severe obstructive sleep apnea occurred during the diagnostic portion of the study (AHI = 69.9/hour). An optimal PAP pressure was selected for this patient ( 16 cm of water) - No significant central sleep apnea occurred during the diagnostic portion of the study (CAI = 0.0/hour). - Moderate oxygen desaturation was noted during the diagnostic portion of the study (Min O2 =83.0%). - The patient snored with moderate snoring volume during the diagnostic portion of the study. - No cardiac abnormalities were noted during this study. - Clinically significant periodic limb movements did not occur during sleep.  DIAGNOSIS - Obstructive Sleep Apnea (327.23 [G47.33 ICD-10])  RECOMMENDATIONS - Trial of CPAP therapy on 16 cm H2O or autopap 10-20. - Patient used a Medium size Resmed Full Face Mask AirFit F30 mask and heated humidification. - Be careful with alcohol, sedatives and other CNS depressants that may worsen sleep apnea and disrupt normal sleep architecture. - Sleep hygiene should be reviewed to assess  factors that may improve sleep quality. - Weight management and regular exercise should be initiated or continued.  [Electronically signed] 11/01/2018 12:08 PM  Baird Lyons MD,  [place], American Board of Sleep Medicine   NPI: [id number]                         [place], [place] [facility]  ELECTRONICALLY SIGNED ON:  11/01/2018, 12:07 PM [place] PH: [phone]   FX: [phone] [place]

## 2018-11-11 ENCOUNTER — Other Ambulatory Visit: Payer: Self-pay

## 2018-11-11 ENCOUNTER — Ambulatory Visit (HOSPITAL_COMMUNITY): Payer: Self-pay

## 2018-11-19 ENCOUNTER — Other Ambulatory Visit: Payer: Self-pay | Admitting: Family Medicine

## 2018-11-21 ENCOUNTER — Ambulatory Visit (INDEPENDENT_AMBULATORY_CARE_PROVIDER_SITE_OTHER): Payer: Self-pay | Admitting: Family Medicine

## 2018-11-21 ENCOUNTER — Other Ambulatory Visit: Payer: Self-pay

## 2018-11-21 VITALS — BP 132/78 | HR 86 | Temp 97.7°F | Resp 20 | Ht 61.5 in | Wt 315.0 lb

## 2018-11-21 DIAGNOSIS — G4733 Obstructive sleep apnea (adult) (pediatric): Secondary | ICD-10-CM | POA: Insufficient documentation

## 2018-11-21 DIAGNOSIS — B9789 Other viral agents as the cause of diseases classified elsewhere: Secondary | ICD-10-CM

## 2018-11-21 DIAGNOSIS — J069 Acute upper respiratory infection, unspecified: Secondary | ICD-10-CM

## 2018-11-21 DIAGNOSIS — Z1211 Encounter for screening for malignant neoplasm of colon: Secondary | ICD-10-CM

## 2018-11-21 DIAGNOSIS — M1712 Unilateral primary osteoarthritis, left knee: Secondary | ICD-10-CM

## 2018-11-21 DIAGNOSIS — F339 Major depressive disorder, recurrent, unspecified: Secondary | ICD-10-CM

## 2018-11-21 DIAGNOSIS — L409 Psoriasis, unspecified: Secondary | ICD-10-CM

## 2018-11-21 MED ORDER — TRIAMCINOLONE ACETONIDE 0.1 % EX OINT: 1.0000 "application " | TOPICAL_OINTMENT | Freq: Two times a day (BID) | CUTANEOUS | 2 refills | Status: DC

## 2018-11-21 NOTE — Assessment & Plan Note (Signed)
  rx for triamcinolone sent to pharmacy

## 2018-11-21 NOTE — Assessment & Plan Note (Addendum)
  Ordered CPAP at 16 cm H20 Counseled weight loss, avoid sedatives/CNS depressants Unclear if we will be able to get this for her on orange card. Will ask nursing team for help.

## 2018-11-21 NOTE — Assessment & Plan Note (Signed)
  Following with [facility], on celexa, vraylar, hydroxyzine. Encouraged her to keep working with [employer].

## 2018-11-21 NOTE — Assessment & Plan Note (Signed)
  Afebrile, well appearing, lungs clear, no tonsillar exudate. Continue albuterol as needed. Course of viral illness discussed. Reviewed supportive care.

## 2018-11-21 NOTE — Assessment & Plan Note (Signed)
Referral made for colonoscopy

## 2018-11-21 NOTE — Progress Notes (Signed)
Subjective:    Patient ID: Amanda Larson, female    DOB: 07/31/1966, 53 y.o.   MRN: [id number]   CC: sleep study results, cough  HPI: had sleep study done last month and wants to discuss results. Reports often waking from sleep gasping for air and daytime fatigue.   She has been going to [facility], states she was diagnosed with PTSD and bipolar disorder. She was started on hydroxyzine and vraylar. She reports she is doing well and sees them weekly.   Cough- has had cough for 3-4 days, associated headache, sore throat. No fevers or chills. She has been using albuterol inhaler with relief. Cough is productive with green-yellow phlegm. No SOB.   She is interested in going back to see Dr. August Saucer for knee pain and chronic low back pain. Unsure if she needs a referral made.  She is due for colonoscopy and is open to getting this done.   Her psoriasis is out of control and referral to rheumatology was denied. She is asking for a topical cream to use.   Smoking status reviewed- former smoker  Review of Systems- see HPI   Objective:  BP 132/78 (BP Location: Left Arm, Patient Position: Sitting, Cuff Size: Large)   Pulse 86   Temp 97.7 F (36.5 C) (Oral)   Resp 20   Ht 5' 1.5" (1.562 m)   Wt (!) 315 lb (142.9 kg)   LMP 01/17/2014   SpO2 96%   BMI 58.55 kg/m  Vitals and nursing note reviewed  General: morbidly obese, in no acute distress HEENT: normocephalic, no scleral icterus or conjunctival pallor, no nasal discharge, moist mucous membranes, good dentition without erythema or discharge noted in posterior oropharynx Neck: supple, non-tender, without lymphadenopathy Cardiac: RRR, clear S1 and S2, no murmurs, rubs, or gallops Respiratory: clear to auscultation bilaterally, no increased work of breathing Skin: warm and dry. Scattered scaly pearly white plaques on extremities Neuro: alert and oriented, no focal deficits  Sleep Study Data 10/23/18 IMPRESSIONS - Severe obstructive  sleep apnea occurred during the diagnostic portion of the study (AHI = 69.9/hour). An optimal PAP pressure was selected for this patient ( 16 cm of water) - No significant central sleep apnea occurred during the diagnostic portion of the study (CAI = 0.0/hour). - Moderate oxygen desaturation was noted during the diagnostic portion of the study (Min O2 =83.0%). - The patient snored with moderate snoring volume during the diagnostic portion of the study. - No cardiac abnormalities were noted during this study. - Clinically significant periodic limb movements did not occur during sleep.  DIAGNOSIS - Obstructive Sleep Apnea (327.23 [G47.33 ICD-10])  RECOMMENDATIONS - Trial of CPAP therapy on 16 cm H2O or autopap 10-20. - Patient used a Medium size Resmed Full Face Mask AirFit F30 mask and heated humidification. - Be careful with alcohol, sedatives and other CNS depressants that may worsen sleep apnea and disrupt normal sleep architecture. - Sleep hygiene should be reviewed to assess factors that may improve sleep quality. - Weight management and regular exercise should be initiated or continued.  Assessment & Plan:    Obstructive sleep apnea  Ordered CPAP at 16 cm H20 Counseled weight loss, avoid sedatives/CNS depressants Unclear if we will be able to get this for her on orange card. Will ask nursing team for help.  Depression  Following with [facility], on celexa, vraylar, hydroxyzine. Encouraged her to keep working with [facility].  Osteoarthritis of left knee  Referral made back to Dr. August Saucer per  pt request. Asked her to call to schedule appointment on her end in case referral was unnecessary as she has seen him in the past.   Psoriasis  rx for triamcinolone sent to pharmacy  Viral URI with cough  Afebrile, well appearing, lungs clear, no tonsillar exudate. Continue albuterol as needed. Course of viral illness discussed. Reviewed supportive care.  Special screening for malignant  neoplasms, colon  Referral made for colonoscopy    Return in about 3 months (around 02/21/2019), or as needed.   Dolores Patty, DO Family Medicine Resident PGY-3

## 2018-11-21 NOTE — Assessment & Plan Note (Signed)
  Referral made back to Dr. August Saucer per pt request. Asked her to call to schedule appointment on her end in case referral was unnecessary as she has seen him in the past.

## 2018-11-21 NOTE — Patient Instructions (Addendum)
Great to see you today!  I put a referral back in to see Dr. August Saucer- you may not need a referral so try to call his office and schedule an appointment.  I also ordered colonoscopy.  I sent in a steroid cream for you to use on psoriasis.  I'm glad you're going to [facility]- let me know if I can help in any way.  I'll work on the CPAP. We will keep you updated.  If you have questions or concerns please do not hesitate to call at [phone].   Dolores Patty, DO PGY-3, [facility] 11/21/2018 10:04 AM  Sleep Apnea Sleep apnea is a condition in which breathing pauses or becomes shallow during sleep. Episodes of sleep apnea usually last 10 seconds or longer, and they may occur as many as 20 times an hour. Sleep apnea disrupts your sleep and keeps your body from getting the rest that it needs. This condition can increase your risk of certain health problems, including:  Heart attack.  Stroke.  Obesity.  Diabetes.  Heart failure.  Irregular heartbeat. There are three kinds of sleep apnea:  Obstructive sleep apnea. This kind is caused by a blocked or collapsed airway.  Central sleep apnea. This kind happens when the part of the brain that controls breathing does not send the correct signals to the muscles that control breathing.  Mixed sleep apnea. This is a combination of obstructive and central sleep apnea. What are the causes? The most common cause of this condition is a collapsed or blocked airway. An airway can collapse or become blocked if:  Your throat muscles are abnormally relaxed.  Your tongue and tonsils are larger than normal.  You are overweight.  Your airway is smaller than normal. What increases the risk? This condition is more likely to develop in people who:  Are overweight.  Smoke.  Have a smaller than normal airway.  Are elderly.  Are female.  Drink alcohol.  Take sedatives or tranquilizers.  Have a family history of sleep apnea.  What are the signs or symptoms? Symptoms of this condition include:  Trouble staying asleep.  Daytime sleepiness and tiredness.  Irritability.  Loud snoring.  Morning headaches.  Trouble concentrating.  Forgetfulness.  Decreased interest in sex.  Unexplained sleepiness.  Mood swings.  Personality changes.  Feelings of depression.  Waking up often during the night to urinate.  Dry mouth.  Sore throat. How is this diagnosed? This condition may be diagnosed with:  A medical history.  A physical exam.  A series of tests that are done while you are sleeping (sleep study). These tests are usually done in a sleep lab, but they may also be done at home. How is this treated? Treatment for this condition aims to restore normal breathing and to ease symptoms during sleep. It may involve managing health issues that can affect breathing, such as high blood pressure or obesity. Treatment may include:  Sleeping on your side.  Using a decongestant if you have nasal congestion.  Avoiding the use of depressants, including alcohol, sedatives, and narcotics.  Losing weight if you are overweight.  Making changes to your diet.  Quitting smoking.  Using a device to open your airway while you sleep, such as: ? An oral appliance. This is a custom-made mouthpiece that shifts your lower jaw forward. ? A continuous positive airway pressure (CPAP) device. This device delivers oxygen to your airway through a mask. ? A nasal expiratory positive airway pressure (EPAP) device.  This device has valves that you put into each nostril. ? A bi-level positive airway pressure (BPAP) device. This device delivers oxygen to your airway through a mask.  Surgery if other treatments do not work. During surgery, excess tissue is removed to create a wider airway. It is important to get treatment for sleep apnea. Without treatment, this condition can lead to:  High blood pressure.  Coronary artery  disease.  (Men) An inability to achieve or maintain an erection (impotence).  Reduced thinking abilities. Follow these instructions at home:  Make any lifestyle changes that your health care provider recommends.  Eat a healthy, well-balanced diet.  Take over-the-counter and prescription medicines only as told by your health care provider.  Avoid using depressants, including alcohol, sedatives, and narcotics.  Take steps to lose weight if you are overweight.  If you were given a device to open your airway while you sleep, use it only as told by your health care provider.  Do not use any tobacco products, such as cigarettes, chewing tobacco, and e-cigarettes. If you need help quitting, ask your health care provider.  Keep all follow-up visits as told by your health care provider. This is important. Contact a health care provider if:  The device that you received to open your airway during sleep is uncomfortable or does not seem to be working.  Your symptoms do not improve.  Your symptoms get worse. Get help right away if:  You develop chest pain.  You develop shortness of breath.  You develop discomfort in your back, arms, or stomach.  You have trouble speaking.  You have weakness on one side of your body.  You have drooping in your face. These symptoms may represent a serious problem that is an emergency. Do not wait to see if the symptoms will go away. Get medical help right away. Call your local emergency services (911 in the [place] Do not drive yourself to the hospital. This information is not intended to replace advice given to you by your health care provider. Make sure you discuss any questions you have with your health care provider. Document Released: 08/17/2002 Document Revised: 03/25/2017 Document Reviewed: 06/06/2015 Elsevier Interactive Patient Education  2019 [employer].

## 2018-11-25 ENCOUNTER — Other Ambulatory Visit: Payer: Self-pay | Admitting: *Deleted

## 2018-11-25 MED ORDER — SIMVASTATIN 40 MG PO TABS: 40.0000 mg | ORAL_TABLET | Freq: Every day | ORAL | 3 refills | Status: DC

## 2018-12-02 ENCOUNTER — Telehealth (INDEPENDENT_AMBULATORY_CARE_PROVIDER_SITE_OTHER): Payer: Self-pay | Admitting: *Deleted

## 2018-12-02 NOTE — Telephone Encounter (Signed)
Called pt and lvm asking pt to call back to answer pre-screening covid-19 questions.

## 2018-12-03 ENCOUNTER — Encounter (INDEPENDENT_AMBULATORY_CARE_PROVIDER_SITE_OTHER): Payer: Self-pay | Admitting: Orthopedic Surgery

## 2018-12-03 ENCOUNTER — Ambulatory Visit (INDEPENDENT_AMBULATORY_CARE_PROVIDER_SITE_OTHER): Payer: Self-pay | Admitting: Orthopedic Surgery

## 2018-12-03 ENCOUNTER — Other Ambulatory Visit: Payer: Self-pay

## 2018-12-03 ENCOUNTER — Ambulatory Visit (INDEPENDENT_AMBULATORY_CARE_PROVIDER_SITE_OTHER): Payer: Self-pay

## 2018-12-03 VITALS — Ht 61.5 in | Wt 310.0 lb

## 2018-12-03 DIAGNOSIS — G8929 Other chronic pain: Secondary | ICD-10-CM

## 2018-12-03 DIAGNOSIS — M25562 Pain in left knee: Secondary | ICD-10-CM

## 2018-12-03 DIAGNOSIS — M25572 Pain in left ankle and joints of left foot: Secondary | ICD-10-CM

## 2018-12-03 NOTE — Progress Notes (Signed)
Office Visit Note   Patient: Amanda Larson           Date of Birth: [birth year]           MRN: [id number] Visit Date: 12/03/2018 Requested by: Clifton Custard, MD [address] [place], [place] [address] PCP: Tillman Sers, DO  Subjective: Chief Complaint  Patient presents with  . Left Knee - Pain  . Left Ankle - Pain    HPI: Patient presents for evaluation of left knee and left ankle and foot.  Patient has morbid obesity and psoriatic arthritis.  She is currently not on any Biologics for her psoriatic arthritis.  She has a known meniscal root tear from last year.  In general she states that her left ankle is hurting her little bit more than the left foot.  She has occasional sharp pains in the ankle but it does not hurt her every day.  She also reports some pain on the plantar aspect of the heel.              ROS: All systems reviewed are negative as they relate to the chief complaint within the history of present illness.  Patient denies  fevers or chills.   Assessment & Plan: Visit Diagnoses:  1. Pain in left ankle and joints of left foot   2. Chronic pain of left knee     Plan: Impression is left knee pain with effusion and known meniscal deficiency which is not practically fixable in this patient due to 2 factors #1 high BMI and low likelihood of success for meniscal root repair and #2 presence of plaque psoriatic arthritis in the region of any portals for attempted repair.  In regards to the left ankle structurally the foot and ankle are intact and radiographs are normal.  I would say when she has pain every day then we could proceed with imaging to rule out structural problem but this looks like it has psoriatic arthritis flare in that joint.  I will see her back as needed  Follow-Up Instructions: Return if symptoms worsen or fail to improve.   Orders:  Orders Placed This Encounter  Procedures  . XR Ankle Complete Left  . XR Knee 1-2 Views Left    No orders of the defined types were placed in this encounter.     Procedures: No procedures performed   Clinical Data: No additional findings.  Objective: Vital Signs: Ht 5' 1.5" (1.562 m)   Wt (!) 310 lb (140.6 kg)   LMP 01/17/2014   BMI 57.63 kg/m   Physical Exam:   Constitutional: Patient appears well-developed HEENT:  Head: Normocephalic Eyes:EOM are normal Neck: Normal range of motion Cardiovascular: Normal rate Pulmonary/chest: Effort normal Neurologic: Patient is alert Skin: Skin is warm Psychiatric: Patient has normal mood and affect    Ortho Exam: Ortho exam demonstrates fairly normal gait alignment.  Pedal pulses palpable bilaterally.  No discrete warmth left ankle versus right.  Left knee effusion is present but range of motion and extensor mechanism intact.  Collaterals and cruciates stable.  Left ankle and foot demonstrate palpable intact nontender anterior to posterior to peroneal and Achilles tendons.  Pedal pulses palpable.  No other masses lymphadenopathy or skin changes noted in that left ankle or foot region.  Range of motion is pretty symmetric with the right ankle which is essentially full dorsiflexion plantarflexion inversion eversion.  Specialty Comments:  No specialty comments available.  Imaging: Xr Ankle Complete  Left  Result Date: 12/03/2018 AP lateral mortise left ankle reviewed.  Mortise is symmetric.  No acute fracture or dislocation.  No arthritis.  Normal left ankle.  Xr Knee 1-2 Views Left  Result Date: 12/03/2018 AP lateral left knee reviewed.  Patient has normal alignment.  Minimal joint space narrowing.  No significant change from radiographs done last year.  Normal left knee    PMFS History: Patient Active Problem List   Diagnosis Date Noted  . Obstructive sleep apnea 11/21/2018  . Viral URI with cough 11/21/2018  . Osteoarthritis of left knee 05/15/2018  . Gastro-esophageal reflux 05/15/2018  . Hyperlipidemia 12/10/2017   . Diabetes (HCC) 11/18/2017  . Depression 02/08/2017  . Chronic low back pain 02/08/2017  . Asthma 02/08/2017  . Psoriasis 02/08/2017  . Special screening for malignant neoplasms, colon 02/08/2017   Past Medical History:  Diagnosis Date  . Bronchitis   . Chronic back pain   . Depression   . Osteoarthritis     Family History  Problem Relation Age of Onset  . Cancer Father   . Heart disease Paternal Grandfather   . Stroke Paternal Grandfather   . Drug abuse Paternal Grandfather   . High Cholesterol Mother   . Diabetes Maternal Aunt   . Hypertension Maternal Aunt   . Diabetes Maternal Uncle     Past Surgical History:  Procedure Laterality Date  . CESAREAN SECTION    . CHOLECYSTECTOMY    . TUBAL LIGATION     Social History   Occupational History  . Not on file  Tobacco Use  . Smoking status: Former Smoker    Last attempt to quit: 07/31/2007    Years since quitting: 11.3  . Smokeless tobacco: Never Used  Substance and Sexual Activity  . Alcohol use: No  . Drug use: No  . Sexual activity: Yes    Birth control/protection: Surgical

## 2018-12-29 ENCOUNTER — Telehealth (HOSPITAL_COMMUNITY): Payer: Self-pay | Admitting: *Deleted

## 2018-12-29 NOTE — Telephone Encounter (Signed)
Telephoned patient at home number and confirmed appointment for April 21. No symptoms of COVID-19. No travel outside of [place] in the past 14 days. No contact with someone with a confirmed diagnosis with COVID-19.

## 2018-12-29 NOTE — Telephone Encounter (Signed)
Telephoned patient at home number and left message to return call to [facility]. Advised if did not speak to patient prior to appointment would have to cancel appointment.

## 2018-12-30 ENCOUNTER — Other Ambulatory Visit: Payer: Self-pay

## 2018-12-30 ENCOUNTER — Encounter (HOSPITAL_COMMUNITY): Payer: Self-pay

## 2018-12-30 ENCOUNTER — Ambulatory Visit (HOSPITAL_COMMUNITY)
Admission: RE | Admit: 2018-12-30 | Discharge: 2018-12-30 | Disposition: A | Discharge status: Home / Self Care | Payer: Medicaid Other | Source: Ambulatory Visit | Attending: Obstetrics and Gynecology | Admitting: Obstetrics and Gynecology

## 2018-12-30 VITALS — BP 124/82 | Temp 97.9°F

## 2018-12-30 DIAGNOSIS — Z1239 Encounter for other screening for malignant neoplasm of breast: Secondary | ICD-10-CM

## 2018-12-30 DIAGNOSIS — N644 Mastodynia: Secondary | ICD-10-CM

## 2018-12-30 HISTORY — DX: Type 2 diabetes mellitus without complications: E11.9

## 2018-12-30 NOTE — Patient Instructions (Signed)
Explained breast self awareness with Amanda Larson. Patient did not need a Pap smear today due to last Pap smear and HPV typing was 04/15/2017. Let her know [facility] will cover Pap smears and HPV typing every 5 years unless has a history of abnormal Pap smears. Referred patient to the [facility] for a diagnostic mammogram. Appointment scheduled for Thursday, January 01, 2019 at 1250.  Patient aware of appointment and will be there. Amanda Larson verbalized understanding.  Amanda Larson, Kathaleen Maser, RN 9:33 AM

## 2018-12-30 NOTE — Progress Notes (Signed)
Complaints of left outer and axillary pain that comes and goes. Patient rates the pain at a 4 out of 10..   Pap Smear: Pap smear not completed today. Last Pap smear was 04/15/2017 at [facility] and normal with negative HPV. Per patient has no history of an abnormal Pap smear. Last Pap smear result is in Epic.  Physical exam: Breasts Breasts symmetrical. Multiple sores on breast that per patient is related to psoriasis. No nipple retraction bilateral breasts. No nipple discharge bilateral breasts. No lymphadenopathy. No lumps palpated bilateral breasts. Complaints of left outer and axillary tenderness on exam. Referred patient to the [facility] for a diagnostic mammogram. Appointment scheduled for Thursday, January 01, 2019 at 1250.        Pelvic/Bimanual No Pap smear completed today since last Pap smear and HPV typing was 04/15/2017. Pap smear not indicated per BCCCP guidelines.   Smoking History: Patient is a former smoker that quit in 2008.  Patient Navigation: Patient education provided. Access to services provided for patient through B[facility]CP program.   Colorectal Cancer Screening: Per patient has never had a colonoscopy completed. No complaints today.   Breast and Cervical Cancer Risk Assessment: Patient has no family history of breast cancer, known genetic mutations, or radiation treatment to the chest before age 37. Patient has no history of cervical dysplasia, immunocompromised, or DES exposure in-utero.  Risk Assessment    Risk Scores      12/30/2018   Last edited by: Lynnell Dike, LPN   5-year risk: 0.9 %   Lifetime risk: 6.8 %

## 2019-01-01 ENCOUNTER — Other Ambulatory Visit: Payer: Self-pay

## 2019-01-01 ENCOUNTER — Ambulatory Visit
Admission: RE | Admit: 2019-01-01 | Discharge: 2019-01-01 | Disposition: A | Discharge status: Home / Self Care | Payer: No Typology Code available for payment source | Source: Ambulatory Visit | Attending: Obstetrics and Gynecology | Admitting: Obstetrics and Gynecology

## 2019-01-01 ENCOUNTER — Other Ambulatory Visit (HOSPITAL_COMMUNITY): Payer: Self-pay | Admitting: Obstetrics and Gynecology

## 2019-01-01 DIAGNOSIS — M79622 Pain in left upper arm: Secondary | ICD-10-CM

## 2019-01-07 ENCOUNTER — Other Ambulatory Visit: Payer: Self-pay | Admitting: Family Medicine

## 2019-01-23 ENCOUNTER — Encounter (HOSPITAL_COMMUNITY): Payer: Self-pay | Admitting: *Deleted

## 2019-04-13 ENCOUNTER — Other Ambulatory Visit: Payer: Self-pay

## 2019-04-13 ENCOUNTER — Ambulatory Visit (INDEPENDENT_AMBULATORY_CARE_PROVIDER_SITE_OTHER): Payer: Self-pay | Admitting: Family Medicine

## 2019-04-13 ENCOUNTER — Ambulatory Visit: Payer: No Typology Code available for payment source | Admitting: Family Medicine

## 2019-04-13 ENCOUNTER — Encounter: Payer: Self-pay | Admitting: Family Medicine

## 2019-04-13 VITALS — BP 130/80 | HR 84 | Ht 61.0 in | Wt 333.4 lb

## 2019-04-13 DIAGNOSIS — E119 Type 2 diabetes mellitus without complications: Secondary | ICD-10-CM

## 2019-04-13 DIAGNOSIS — M545 Low back pain: Secondary | ICD-10-CM

## 2019-04-13 DIAGNOSIS — G8929 Other chronic pain: Secondary | ICD-10-CM

## 2019-04-13 DIAGNOSIS — Z1211 Encounter for screening for malignant neoplasm of colon: Secondary | ICD-10-CM

## 2019-04-13 DIAGNOSIS — Z0001 Encounter for general adult medical examination with abnormal findings: Secondary | ICD-10-CM

## 2019-04-13 LAB — POCT GLYCOSYLATED HEMOGLOBIN (HGB A1C): HbA1c, POC (controlled diabetic range): 8.6 % — AB (ref 0.0–7.0)

## 2019-04-13 LAB — POCT UA - MICROALBUMIN
Albumin/Creatinine Ratio, Urine, POC: <30
Creatinine, POC: 200 mg/dL
Microalbumin Ur, POC: 30 mg/L

## 2019-04-13 MED ORDER — ACETAMINOPHEN 325 MG PO TABS: 975.0000 mg | ORAL_TABLET | Freq: Three times a day (TID) | ORAL | 0 refills | Status: AC

## 2019-04-13 MED ORDER — DICLOFENAC SODIUM 1 % TD GEL: 4.0000 g | Freq: Four times a day (QID) | TRANSDERMAL | 3 refills | Status: DC

## 2019-04-13 MED ORDER — METFORMIN HCL 1000 MG PO TABS: 1000.0000 mg | ORAL_TABLET | Freq: Two times a day (BID) | ORAL | 0 refills | Status: DC

## 2019-04-13 MED ORDER — NAPROXEN 500 MG PO TABS: ORAL_TABLET | ORAL | 0 refills | Status: DC

## 2019-04-13 NOTE — Progress Notes (Signed)
Established Patient Office Visit  Subjective:  Patient ID: Amanda Larson, female    DOB: February 28, 1966  Age: 53 y.o. MRN: [id number]  CC:  Chief Complaint  Patient presents with   Annual Exam    HPI Amanda Larson presents for routine physical.  Amanda Larson reports headache for past week and bilateral jaw pain for the past 2 weeks.  States Amanda Larson grinds her teeth. Reports bilateral lower back pain that doesn't resolve with Tylenol and Naproxen.    Past Medical History:  Diagnosis Date   Bronchitis    Chronic back pain    Depression    Diabetes mellitus without complication ([place])    Osteoarthritis     Past Surgical History:  Procedure Laterality Date   CESAREAN SECTION     CHOLECYSTECTOMY     TUBAL LIGATION      Family History  Problem Relation Age of Onset   Cancer Father        skin   Heart disease Paternal Grandfather    Stroke Paternal Grandfather    Drug abuse Paternal Grandfather    High Cholesterol Mother    Diabetes Maternal Aunt    Hypertension Maternal Aunt    Diabetes Maternal Uncle     Social History   Socioeconomic History   Marital status: Significant Other    Spouse name: Not on file   Number of children: 3   Years of education: Not on file   Highest education level: Some college, no degree  Occupational History   Not on file  Social Needs   Financial resource strain: Not on file   Food insecurity    Worry: Not on file    Inability: Not on file   Transportation needs    Medical: No    Non-medical: No  Tobacco Use   Smoking status: Former Smoker    Quit date: 07/31/2007    Years since quitting: 11.7   Smokeless tobacco: Never Used  Substance and Sexual Activity   Alcohol use: Yes    Comment: 3 drinks a year    Drug use: Yes    Types: Marijuana   Sexual activity: Yes    Birth control/protection: Surgical  Lifestyle   Physical activity    Days per week: Not on file    Minutes per session: Not on file     Stress: Not on file  Relationships   Social connections    Talks on phone: Not on file    Gets together: Not on file    Attends religious service: Not on file    Active member of club or organization: Not on file    Attends meetings of clubs or organizations: Not on file    Relationship status: Not on file   Intimate partner violence    Fear of current or ex partner: Not on file    Emotionally abused: Not on file    Physically abused: Not on file    Forced sexual activity: Not on file  Other Topics Concern   Not on file  Social History Narrative   Not on file    Outpatient Medications Prior to Visit  Medication Sig Dispense Refill   albuterol (PROVENTIL HFA;VENTOLIN HFA) 108 (90 Base) MCG/ACT inhaler Inhale 2 puffs into the lungs every 6 (six) hours as needed for wheezing or shortness of breath. 1 Inhaler 2   citalopram (CELEXA) 40 MG tablet Take 1 tablet (40 mg total) by mouth daily. 90 tablet 3   famotidine (  PEPCID) 20 MG tablet Take 1 tablet (20 mg total) by mouth 2 (two) times daily. 60 tablet 2   hydrOXYzine (ATARAX/VISTARIL) 10 MG tablet Take 10 mg by mouth 3 (three) times daily as needed.     Omega-3 Fatty Acids (FISH OIL) 1000 MG CAPS Take by mouth.     simvastatin (ZOCOR) 40 MG tablet Take 1 tablet (40 mg total) by mouth at bedtime. 90 tablet 3   triamcinolone ointment (KENALOG) 0.1 % Apply 1 application topically 2 (two) times daily. 30 g 2   acetaminophen (TYLENOL) 325 MG tablet Take 650 mg by mouth every 6 (six) hours as needed.     diclofenac sodium (VOLTAREN) 1 % GEL Apply 4 g topically 4 (four) times daily. (Patient not taking: Reported on 12/30/2018) 200 g 3   metFORMIN (GLUCOPHAGE) 1000 MG tablet Take 1 tablet (1,000 mg total) by mouth 2 (two) times daily with a meal. 180 tablet 0   naproxen (NAPROSYN) 500 MG tablet TAKE 1 TABLET MY MOUTH 2 TIMES DAILY WITH A MEAL 60 tablet 0   No facility-administered medications prior to visit.     Allergies   Allergen Reactions   Sulfa Antibiotics Anaphylaxis and Swelling   Clarithromycin Nausea Only    ROS Review of Systems  Constitutional: Negative for fever.  HENT: Negative for congestion and rhinorrhea.   Eyes: Negative for visual disturbance.  Respiratory: Negative for cough and shortness of breath.   Cardiovascular: Negative for chest pain.  Gastrointestinal: Negative for abdominal pain, nausea and vomiting.  Endocrine: Negative for polydipsia.  Genitourinary: Negative for frequency and urgency.  Musculoskeletal: Positive for arthralgias and back pain.  Skin: Positive for rash (psoriatic patches (baseline)).  Neurological: Positive for headaches.  Psychiatric/Behavioral: Positive for sleep disturbance. Negative for suicidal ideas. The patient is nervous/anxious.       Objective:    Physical Exam  Constitutional: Amanda Larson is oriented to person, place, and time. Vital signs are normal. Amanda Larson appears well-developed and well-nourished. No distress.  Obese female, pleasant   HENT:  Head: Normocephalic and atraumatic.  Nose: Nose normal.  Mouth/Throat: Oropharynx is clear and moist. No oropharyngeal exudate.  Eyes: Pupils are equal, round, and reactive to light. Conjunctivae and EOM are normal. No scleral icterus.  Neck: Normal range of motion. Neck supple.  Cardiovascular: Normal rate, regular rhythm and normal heart sounds.  No murmur heard. Pulmonary/Chest: Effort normal and breath sounds normal. No respiratory distress.  Abdominal: Soft. Bowel sounds are normal. There is no abdominal tenderness.  Obese abdomen   Musculoskeletal: Normal range of motion.        General: No tenderness or edema.  Lymphadenopathy:    Amanda Larson has no cervical adenopathy.  Neurological: Amanda Larson is alert and oriented to person, place, and time.  Skin: Skin is warm and dry. Rash (psoriatic rash bilateral upper and lower extremities ) noted.  Psychiatric: Amanda Larson has a normal mood and affect. Her behavior is normal.  Thought content normal.  Nursing note and vitals reviewed.   BP 130/80    Pulse 84    Ht 5\' 1"  (1.549 m)    Wt (!) 333 lb 6 oz (151.2 kg)    LMP 01/17/2014    SpO2 94%    BMI 62.99 kg/m  Wt Readings from Last 3 Encounters:  04/13/19 (!) 333 lb 6 oz (151.2 kg)  12/03/18 (!) 310 lb (140.6 kg)  11/21/18 (!) 315 lb (142.9 kg)     Health Maintenance Due  Topic Date Due  PNEUMOCOCCAL POLYSACCHARIDE VACCINE AGE 75-64 HIGH RISK  06/07/1967   OPHTHALMOLOGY EXAM  06/07/1975   TETANUS/TDAP  06/06/1984   MAMMOGRAM  06/07/2015   COLONOSCOPY  06/07/2015   URINE MICROALBUMIN  12/10/2018   HEMOGLOBIN A1C  02/21/2019   INFLUENZA VACCINE  04/11/2019    There are no preventive care reminders to display for this patient.  No results found for: TSH Lab Results  Component Value Date   WBC 8.1 05/14/2018   HGB 13.5 05/14/2018   HCT 42.2 05/14/2018   MCV 86 05/14/2018   PLT 230 05/14/2018   Lab Results  Component Value Date   NA 142 05/14/2018   K 4.6 05/14/2018   CO2 22 05/14/2018   GLUCOSE 177 (H) 05/14/2018   BUN 17 05/14/2018   CREATININE 0.70 05/14/2018   BILITOT 0.4 05/14/2018   ALKPHOS 78 05/14/2018   AST 34 05/14/2018   ALT 48 (H) 05/14/2018   PROT 7.4 05/14/2018   ALBUMIN 4.6 05/14/2018   CALCIUM 10.0 05/14/2018   Lab Results  Component Value Date   CHOL 181 05/14/2018   Lab Results  Component Value Date   HDL 30 (L) 05/14/2018   Lab Results  Component Value Date   LDLCALC 106 (H) 05/14/2018   Lab Results  Component Value Date   TRIG 224 (H) 05/14/2018   Lab Results  Component Value Date   CHOLHDL 6.0 (H) 05/14/2018   Lab Results  Component Value Date   HGBA1C 8.6 (A) 04/13/2019      Assessment & Plan:   Problem List Items Addressed This Visit      Endocrine   Diabetes (HCC) - Primary (Chronic)    Patient reported Amanda Larson stopped taking her metformin because Amanda Larson was feeling bad.  Encouraged her to take diabetic medication. Her A1C increased  from 6.4 to 8.6. Counseled her on dietary changes and need to increase activity level at home.   Diabetic foot exam completed and is normal.  Urine microalbumin pending. Referral to eye exam to [facility] as Amanda Larson has established care previously.       Relevant Medications   metFORMIN (GLUCOPHAGE) 1000 MG tablet   Other Relevant Orders   HgB A1c (Completed)   POCT UA - Microalbumin   Ambulatory referral to Optometry   Lipid Panel   HgB A1c     Other   Chronic low back pain (Chronic)    Patient reports continued pain with 1500 mg Tylenol BID and Naproxen however functionally Amanda Larson able to move around with current regimen. Amanda Larson would like to be pain free.  Expressed concerns for exercise and weight loss if Amanda Larson is unable to exercise around due to pain. Advised pt to take apply Voltaren gel to lower back for additional pain relief.        Relevant Medications   acetaminophen (TYLENOL) 325 MG tablet   naproxen (NAPROSYN) 500 MG tablet   Special screening for malignant neoplasms, colon    Referral for colonoscopy.        Relevant Orders   Ambulatory referral to Gastroenterology      Meds ordered this encounter  Medications   acetaminophen (TYLENOL) 325 MG tablet    Sig: Take 3 tablets (975 mg total) by mouth 3 (three) times daily.    Dispense:  270 tablet    Refill:  0   diclofenac sodium (VOLTAREN) 1 % GEL    Sig: Apply 4 g topically 4 (four) times daily.    Dispense:  200 g    Refill:  3   metFORMIN (GLUCOPHAGE) 1000 MG tablet    Sig: Take 1 tablet (1,000 mg total) by mouth 2 (two) times daily with a meal.    Dispense:  180 tablet    Refill:  0   naproxen (NAPROSYN) 500 MG tablet    Sig: TAKE 1 TABLET MY MOUTH 2 TIMES DAILY WITH A MEAL    Dispense:  60 tablet    Refill:  0    Follow-up: Return in about 3 months (around 07/14/2019).    Katha CabalVondra Shantel Wesely, MD

## 2019-04-13 NOTE — Assessment & Plan Note (Signed)
Patient reports continued pain with 1500 mg Tylenol BID and Naproxen however functionally she able to move around with current regimen. She would like to be pain free.  Expressed concerns for exercise and weight loss if she is unable to exercise around due to pain. Advised pt to take apply Voltaren gel to lower back for additional pain relief.

## 2019-04-13 NOTE — Patient Instructions (Addendum)
It was great seeing you today!   I'd like to see you back in 3 months but if you need to be seen earlier than that for any new issues we're happy to fit you in, just give us a call!    If you have questions or concerns please do not hesitate to call at [phone].     Diabetes Mellitus and Nutrition, Adult When you have diabetes (diabetes mellitus), it is very important to have healthy eating habits because your blood sugar (glucose) levels are greatly affected by what you eat and drink. Eating healthy foods in the appropriate amounts, at about the same times every day, can help you:  Control your blood glucose.  Lower your risk of heart disease.  Improve your blood pressure.  Reach or maintain a healthy weight. Every person with diabetes is different, and each person has different needs for a meal plan. Your health care provider may recommend that you work with a diet and nutrition specialist (dietitian) to make a meal plan that is best for you. Your meal plan may vary depending on factors such as:  The calories you need.  The medicines you take.  Your weight.  Your blood glucose, blood pressure, and cholesterol levels.  Your activity level.  Other health conditions you have, such as heart or kidney disease. How do carbohydrates affect me? Carbohydrates, also called carbs, affect your blood glucose level more than any other type of food. Eating carbs naturally raises the amount of glucose in your blood. Carb counting is a method for keeping track of how many carbs you eat. Counting carbs is important to keep your blood glucose at a healthy level, especially if you use insulin or take certain oral diabetes medicines. It is important to know how many carbs you can safely have in each meal. This is different for every person. Your dietitian can help you calculate how many carbs you should have at each meal and for each snack. Foods that contain carbs include:  Bread, cereal, rice,  pasta, and crackers.  Potatoes and corn.  Peas, beans, and lentils.  Milk and yogurt.  Fruit and juice.  Desserts, such as cakes, cookies, ice cream, and candy. How does alcohol affect me? Alcohol can cause a sudden decrease in blood glucose (hypoglycemia), especially if you use insulin or take certain oral diabetes medicines. Hypoglycemia can be a life-threatening condition. Symptoms of hypoglycemia (sleepiness, dizziness, and confusion) are similar to symptoms of having too much alcohol. If your health care provider says that alcohol is safe for you, follow these guidelines:  Limit alcohol intake to no more than 1 drink per day for nonpregnant women and 2 drinks per day for men. One drink equals 12 oz of beer, 5 oz of wine, or 1 oz of hard liquor.  Do not drink on an empty stomach.  Keep yourself hydrated with water, diet soda, or unsweetened iced tea.  Keep in mind that regular soda, juice, and other mixers may contain a lot of sugar and must be counted as carbs. What are tips for following this plan?  Reading food labels  Start by checking the serving size on the "Nutrition Facts" label of packaged foods and drinks. The amount of calories, carbs, fats, and other nutrients listed on the label is based on one serving of the item. Many items contain more than one serving per package.  Check the total grams (g) of carbs in one serving. You can calculate the number of servings  of carbs in one serving by dividing the total carbs by 15. For example, if a food has 30 g of total carbs, it would be equal to 2 servings of carbs.  Check the number of grams (g) of saturated and trans fats in one serving. Choose foods that have low or no amount of these fats.  Check the number of milligrams (mg) of salt (sodium) in one serving. Most people should limit total sodium intake to less than 2,300 mg per day.  Always check the nutrition information of foods labeled as "low-fat" or "nonfat". These  foods may be higher in added sugar or refined carbs and should be avoided.  Talk to your dietitian to identify your daily goals for nutrients listed on the label. Shopping  Avoid buying canned, premade, or processed foods. These foods tend to be high in fat, sodium, and added sugar.  Shop around the outside edge of the grocery store. This includes fresh fruits and vegetables, bulk grains, fresh meats, and fresh dairy. Cooking  Use low-heat cooking methods, such as baking, instead of high-heat cooking methods like deep frying.  Cook using healthy oils, such as olive, canola, or sunflower oil.  Avoid cooking with butter, cream, or high-fat meats. Meal planning  Eat meals and snacks regularly, preferably at the same times every day. Avoid going long periods of time without eating.  Eat foods high in fiber, such as fresh fruits, vegetables, beans, and whole grains. Talk to your dietitian about how many servings of carbs you can eat at each meal.  Eat 4-6 ounces (oz) of lean protein each day, such as lean meat, chicken, fish, eggs, or tofu. One oz of lean protein is equal to: ? 1 oz of meat, chicken, or fish. ? 1 egg. ?  cup of tofu.  Eat some foods each day that contain healthy fats, such as avocado, nuts, seeds, and fish. Lifestyle  Check your blood glucose regularly.  Exercise regularly as told by your health care provider. This may include: ? 150 minutes of moderate-intensity or vigorous-intensity exercise each week. This could be brisk walking, biking, or water aerobics. ? Stretching and doing strength exercises, such as yoga or weightlifting, at least 2 times a week.  Take medicines as told by your health care provider.  Do not use any products that contain nicotine or tobacco, such as cigarettes and e-cigarettes. If you need help quitting, ask your health care provider.  Work with a Veterinary surgeoncounselor or diabetes educator to identify strategies to manage stress and any emotional and  social challenges. Questions to ask a health care provider  Do I need to meet with a diabetes educator?  Do I need to meet with a dietitian?  What number can I call if I have questions?  When are the best times to check my blood glucose? Where to find more information:  American Diabetes Association: diabetes.org  Academy of Nutrition and Dietetics: www.eatright.[employer]org  National Institute of Diabetes and Digestive and Kidney Diseases (NIH): Car[employer].tnwww.niddk.nih.gov Summary  A healthy meal plan will help you control your blood glucose and maintain a healthy lifestyle.  Working with a diet and nutrition specialist (dietitian) can help you make a meal plan that is best for you.  Keep in mind that carbohydrates (carbs) and alcohol have immediate effects on your blood glucose levels. It is important to count carbs and to use alcohol carefully. This information is not intended to replace advice given to you by your health care provider. Make sure  you discuss any questions you have with your health care provider. Document Released: 05/24/2005 Document Revised: 08/09/2017 Document Reviewed: 10/01/2016 Elsevier Patient Education  2020 [employer].

## 2019-04-13 NOTE — Assessment & Plan Note (Addendum)
Patient reported she stopped taking her metformin because she was feeling bad.  Encouraged her to take diabetic medication. Her A1C increased from 6.4 to 8.6. Counseled her on dietary changes and need to increase activity level at home.   Diabetic foot exam completed and is normal.  Urine microalbumin within normal range. Referral to eye exam to [facility] as she has established care previously.

## 2019-04-13 NOTE — Assessment & Plan Note (Signed)
Referral for colonoscopy °

## 2019-04-15 ENCOUNTER — Encounter: Payer: Self-pay | Admitting: Gastroenterology

## 2019-04-16 ENCOUNTER — Telehealth: Payer: Self-pay | Admitting: *Deleted

## 2019-04-16 NOTE — Telephone Encounter (Signed)
Yes clinic visit first would be best if you can coordinate. Thanks

## 2019-04-16 NOTE — Telephone Encounter (Signed)
Dr Havery Moros,  This pt is scheduled for a direct colon 8-17 Monday. She has no GI history.  She has a history of GERD, Asthma, DM, and OSA    Her BMI is 62.99  Do you want an OV prior to a colon or direct hospital ?  Please advise, Thanks for your time, Marijean Niemann

## 2019-04-16 NOTE — Telephone Encounter (Signed)
Called pt and explained need for OV due to BMI 62.99-- Pt verbalized understanding- I canceled her PV for 8-10 Monday and I canceled her colon for 8-17 Monday as well- Pt scheduled an OV for 9-15 Tuesday at 11 am with Dr Havery Moros-  asked her to arrive at 1045 am , 3rd floor- Mailed her an AVS with date time address phone number after I verified her address    Lelan Pons PV

## 2019-04-27 ENCOUNTER — Encounter: Payer: Medicaid Other | Admitting: Gastroenterology

## 2019-05-26 ENCOUNTER — Ambulatory Visit: Payer: Medicaid Other | Admitting: Gastroenterology

## 2019-05-26 NOTE — Progress Notes (Deleted)
   HPI :  53 y/o female with a history of DM, morbid obesity, referred here by Lyndee Hensen MD for colon cancer screening    Past Medical History:  Diagnosis Date  . Bronchitis   . Chronic back pain   . Depression   . Diabetes mellitus without complication ([place])   . Osteoarthritis      Past Surgical History:  Procedure Laterality Date  . CESAREAN SECTION    . CHOLECYSTECTOMY    . TUBAL LIGATION     Family History  Problem Relation Age of Onset  . Cancer Father        skin  . Heart disease Paternal Grandfather   . Stroke Paternal Grandfather   . Drug abuse Paternal Grandfather   . High Cholesterol Mother   . Diabetes Maternal Aunt   . Hypertension Maternal Aunt   . Diabetes Maternal Uncle    Social History   Tobacco Use  . Smoking status: Former Smoker    Quit date: 07/31/2007    Years since quitting: 11.8  . Smokeless tobacco: Never Used  Substance Use Topics  . Alcohol use: Yes    Comment: 3 drinks a year   . Drug use: Yes    Types: Marijuana   Current Outpatient Medications  Medication Sig Dispense Refill  . albuterol (PROVENTIL HFA;VENTOLIN HFA) 108 (90 Base) MCG/ACT inhaler Inhale 2 puffs into the lungs every 6 (six) hours as needed for wheezing or shortness of breath. 1 Inhaler 2  . citalopram (CELEXA) 40 MG tablet Take 1 tablet (40 mg total) by mouth daily. 90 tablet 3  . diclofenac sodium (VOLTAREN) 1 % GEL Apply 4 g topically 4 (four) times daily. 200 g 3  . famotidine (PEPCID) 20 MG tablet Take 1 tablet (20 mg total) by mouth 2 (two) times daily. 60 tablet 2  . hydrOXYzine (ATARAX/VISTARIL) 10 MG tablet Take 10 mg by mouth 3 (three) times daily as needed.    . metFORMIN (GLUCOPHAGE) 1000 MG tablet Take 1 tablet (1,000 mg total) by mouth 2 (two) times daily with a meal. 180 tablet 0  . naproxen (NAPROSYN) 500 MG tablet TAKE 1 TABLET MY MOUTH 2 TIMES DAILY WITH A MEAL 60 tablet 0  . Omega-3 Fatty Acids (FISH OIL) 1000 MG CAPS Take by mouth.    .  simvastatin (ZOCOR) 40 MG tablet Take 1 tablet (40 mg total) by mouth at bedtime. 90 tablet 3  . triamcinolone ointment (KENALOG) 0.1 % Apply 1 application topically 2 (two) times daily. 30 g 2   No current facility-administered medications for this visit.    Allergies  Allergen Reactions  . Sulfa Antibiotics Anaphylaxis and Swelling  . Clarithromycin Nausea Only     Review of Systems: All systems reviewed and negative except where noted in HPI.    No results found.  Physical Exam: LMP 01/17/2014  Constitutional: Pleasant,well-developed, ***female in no acute distress. HEENT: Normocephalic and atraumatic. Conjunctivae are normal. No scleral icterus. Neck supple.  Cardiovascular: Normal rate, regular rhythm.  Pulmonary/chest: Effort normal and breath sounds normal. No wheezing, rales or rhonchi. Abdominal: Soft, nondistended, nontender. Bowel sounds active throughout. There are no masses palpable. No hepatomegaly. Extremities: no edema Lymphadenopathy: No cervical adenopathy noted. Neurological: Alert and oriented to person place and time. Skin: Skin is warm and dry. No rashes noted. Psychiatric: Normal mood and affect. Behavior is normal.   ASSESSMENT AND PLAN:  Lyndee Hensen, MD

## 2019-06-14 ENCOUNTER — Other Ambulatory Visit: Payer: Self-pay | Admitting: Family Medicine

## 2019-07-01 ENCOUNTER — Ambulatory Visit: Payer: Medicaid Other | Admitting: Gastroenterology

## 2019-07-16 ENCOUNTER — Ambulatory Visit (INDEPENDENT_AMBULATORY_CARE_PROVIDER_SITE_OTHER): Payer: Self-pay | Admitting: Family Medicine

## 2019-07-16 ENCOUNTER — Other Ambulatory Visit: Payer: Self-pay

## 2019-07-16 ENCOUNTER — Encounter: Payer: Self-pay | Admitting: Family Medicine

## 2019-07-16 VITALS — BP 118/70 | HR 85 | Temp 97.9°F | Wt 343.0 lb

## 2019-07-16 DIAGNOSIS — M545 Low back pain, unspecified: Secondary | ICD-10-CM

## 2019-07-16 DIAGNOSIS — L02429 Furuncle of limb, unspecified: Secondary | ICD-10-CM | POA: Insufficient documentation

## 2019-07-16 DIAGNOSIS — E119 Type 2 diabetes mellitus without complications: Secondary | ICD-10-CM

## 2019-07-16 DIAGNOSIS — L0292 Furuncle, unspecified: Secondary | ICD-10-CM

## 2019-07-16 DIAGNOSIS — E785 Hyperlipidemia, unspecified: Secondary | ICD-10-CM

## 2019-07-16 DIAGNOSIS — G8929 Other chronic pain: Secondary | ICD-10-CM

## 2019-07-16 LAB — POCT GLYCOSYLATED HEMOGLOBIN (HGB A1C): HbA1c, POC (controlled diabetic range): 8.1 % — AB (ref 0.0–7.0)

## 2019-07-16 MED ORDER — SIMVASTATIN 40 MG PO TABS: 40.0000 mg | ORAL_TABLET | Freq: Every day | ORAL | 3 refills | Status: DC

## 2019-07-16 MED ORDER — MELOXICAM 15 MG PO TABS: 15.0000 mg | ORAL_TABLET | Freq: Every day | ORAL | 0 refills | Status: DC

## 2019-07-16 MED ORDER — AMOXICILLIN-POT CLAVULANATE 875-125 MG PO TABS: 1.0000 | ORAL_TABLET | Freq: Two times a day (BID) | ORAL | 0 refills | Status: DC

## 2019-07-16 MED ORDER — NYSTATIN 100000 UNIT/GM EX POWD: Freq: Four times a day (QID) | CUTANEOUS | 0 refills | Status: DC

## 2019-07-16 MED ORDER — METFORMIN HCL ER 500 MG PO TB24: 1500.0000 mg | ORAL_TABLET | Freq: Every day | ORAL | 2 refills | Status: DC

## 2019-07-16 NOTE — Progress Notes (Signed)
Subjective:  Amanda Larson is a 53 y.o. female who presents to the [facility] today with a chief complaint of DM follow up.   HPI:   Amanda Larson here for follow up.  Patient with history of diabetes, hypertension, hyperlipidemia, GERD, asthma, OSA, chronic pain and depression.  Diabetes Mellitus  Eats 2-3  meals a day and rarely snacks.  Patient reports she has been drinking slushy's daily from the convenient store and drinking cherry 7-Up.  Reports only taking one dose of metformin daily.  Endorses pannus irritation that she treats with nystatin powder.  Denies increased thirst, hunger, or frequent urination. Checks glucose with glucometer when she gets up in the morning.  Reports she had an Larson exam with Amanda Larson.    HLD Takes simvastatin. Denies missing doses of antihypertensive medications. Denies chest pain, palpitations, lower extremity edema, exertional dyspnea, headaches and vision changes.   Arthritis She reports ongoing chronic left knee pain and chronic back pain.  Reports that the Naprosyn is not working well.  States that she used to take diclofenac and tramadol with relief.   Left axillary wound Patient reports boil has been there for 2 to 3 weeks.  Reports purulent drainage on multiple occasions.  States that she tried to drain it herself however it continues to drain.  Denies fever, chills.    [place], medications and smoking status reviewed.   ROS: See HPI     Objective:  Physical Exam: BP 118/70   Pulse 85   Temp 97.9 F (36.6 C) (Oral)   Wt (!) 343 lb (155.6 kg)   LMP 01/17/2014   SpO2 93%   BMI 64.81 kg/m     GEN: Obese female, no acute distress CV: regular rate and rhythm, no murmurs appreciated  RESP: no increased work of breathing, clear to ascultation bilaterally ABD: Obese abdomen, soft, non-tender, non-distended MSK: no lower extremity edema, or cyanosis noted SKIN: warm, dry, silver-colored plaques diffusely, left axillary: 1 cm boil  with granulation tissue and 3 cm circumferential surrounding erythema  NEURO: grossly normal, moves all extremities appropriately PSYCH: Normal affect and thought content   Results for orders placed or performed in visit on 07/16/19 (from the past 72 hour(s))  HgB A1c     Status: Abnormal   Collection Time: 07/16/19  2:40 PM  Result Value Ref Range   Hemoglobin A1C     HbA1c POC (<> result, manual entry)     HbA1c, POC (prediabetic range)     HbA1c, POC (controlled diabetic range) 8.1 (A) 0.0 - 7.0 %     Assessment/Plan:  Boil of upper extremity Left axillary boil adjacent to lateral breast.  Will treat with 10-day course Augmentin.  Patient to follow-up in 2 weeks and if not improved will refer to general surgery.  Diabetes ([place]) Patient only taking 1 dose of 1000 mg Metformin daily.  We will switch to 1500 mg Metformin XR as it can be taken daily.  Hopefully, once daily dosing will increase medication adherence.  Patient will benefit from GLP-1 (Victoza) considering her BMI of 64.  Consider Jardiance 10 mg at next visit.  She is not amendable to injecting herself.  However, she will think about it and we will discuss in 1 month follow-up.   A1C today 8.1 previously 8.6 on 04/13/19 and 6.4 on 08/22/18. Microalbumin performed 04/13/19 was unremarkable.  Diabetic foot exam up-to-date.  Obtain records from Larson exam.  Patient does not want any additional pills.  States that she will put cut back on drinking the daily slushy's and soda.  Follow-up in 1 month.  -Change to Metformin XR 500 mg daily -Refill nystatin powder and simvastatin -Readdress need for second agent: Jardiance versus Victoza -BMP pending  Hyperlipidemia Previous lipid panel (a year ago) indicated Total chol 181, TGs 224 and LDL 106. Home regimen includes simvastatin 40 mg at bedtime. -Continue home regimen -Repeat lipid panel today  Chronic low back pain Switch from Naprosyn to meloxicam.  Advised patient not to take  concurrent home NSAIDs.  Stop taking Naprosyn.  Adjunct pain with Tylenol as needed. -Prescription sent to pharmacy for 15 mg meloxicam daily      Orders Placed This Encounter  Procedures  . Lipid Panel  . Basic Metabolic Panel  . HgB A1c    Meds ordered this encounter  Medications  . nystatin (MYCOSTATIN/NYSTOP) powder    Sig: Apply topically 4 (four) times daily.    Dispense:  15 g    Refill:  0  . meloxicam (MOBIC) 15 MG tablet    Sig: Take 1 tablet (15 mg total) by mouth daily.    Dispense:  30 tablet    Refill:  0  . amoxicillin-clavulanate (AUGMENTIN) 875-125 MG tablet    Sig: Take 1 tablet by mouth 2 (two) times daily.    Dispense:  20 tablet    Refill:  0  . metFORMIN (GLUCOPHAGE-XR) 500 MG 24 hr tablet    Sig: Take 3 tablets (1,500 mg total) by mouth daily with breakfast.    Dispense:  90 tablet    Refill:  2  . simvastatin (ZOCOR) 40 MG tablet    Sig: Take 1 tablet (40 mg total) by mouth at bedtime.    Dispense:  90 tablet    Refill:  3      Katha Cabal, DO PGY-1, [facility] 07/16/2019 1:48 PM

## 2019-07-16 NOTE — Assessment & Plan Note (Addendum)
Patient only taking 1 dose of 1000 mg Metformin daily.  We will switch to 1500 mg Metformin XR as it can be taken daily.  Hopefully, once daily dosing will increase medication adherence.  Patient will benefit from GLP-1 (Victoza) considering her BMI of 64.  Consider Jardiance 10 mg at next visit.  She is not amendable to injecting herself.  However, she will think about it and we will discuss in 1 month follow-up.   A1C today 8.1 previously 8.6 on 04/13/19 and 6.4 on 08/22/18. Microalbumin performed 04/13/19 was unremarkable.  Diabetic foot exam up-to-date.  Obtain records from eye exam.  Patient does not want any additional pills. States that she will put cut back on drinking the daily slushy's and soda.  Follow-up in 1 month.  -Change to Metformin XR 500 mg daily -Refill nystatin powder and simvastatin -Readdress need for second agent: Jardiance versus Victoza -BMP pending

## 2019-07-16 NOTE — Assessment & Plan Note (Signed)
Switch from Naprosyn to meloxicam.  Advised patient not to take concurrent home NSAIDs.  Stop taking Naprosyn.  Adjunct pain with Tylenol as needed. -Prescription sent to pharmacy for 15 mg meloxicam daily

## 2019-07-16 NOTE — Patient Instructions (Signed)
It was great seeing you today!   I'd like to see you back 4 weeks but if you need to be seen earlier than that for any new issues we're happy to fit you in, just give us a call!   If you have questions or concerns please do not hesitate to call at [phone].  Dr. Xuan Mateus  [facility]  

## 2019-07-16 NOTE — Assessment & Plan Note (Signed)
Previous lipid panel (a year ago) indicated Total chol 181, TGs 224 and LDL 106. Home regimen includes simvastatin 40 mg at bedtime. -Continue home regimen -Repeat lipid panel today

## 2019-07-16 NOTE — Assessment & Plan Note (Signed)
Left axillary boil adjacent to lateral breast.  Will treat with 10-day course Augmentin.  Patient to follow-up in 2 weeks and if not improved will refer to general surgery.

## 2019-07-17 LAB — BASIC METABOLIC PANEL
BUN/Creatinine Ratio: 24 — ABNORMAL HIGH (ref 9–23)
BUN: 13 mg/dL (ref 6–24)
CO2: 25 mmol/L (ref 20–29)
Calcium: 9.1 mg/dL (ref 8.7–10.2)
Chloride: 101 mmol/L (ref 96–106)
Creatinine, Ser: 0.55 mg/dL — ABNORMAL LOW (ref 0.57–1.00)
GFR calc Af Amer: 123 mL/min/{1.73_m2} (ref >59)
GFR calc non Af Amer: 107 mL/min/{1.73_m2} (ref >59)
Glucose: 254 mg/dL — ABNORMAL HIGH (ref 65–99)
Potassium: 4.5 mmol/L (ref 3.5–5.2)
Sodium: 139 mmol/L (ref 134–144)

## 2019-07-17 LAB — LIPID PANEL
Chol/HDL Ratio: 6.3 ratio — ABNORMAL HIGH (ref 0.0–4.4)
Cholesterol, Total: 200 mg/dL — ABNORMAL HIGH (ref 100–199)
HDL: 32 mg/dL — ABNORMAL LOW (ref >39)
LDL Chol Calc (NIH): 127 mg/dL — ABNORMAL HIGH (ref 0–99)
Triglycerides: 231 mg/dL — ABNORMAL HIGH (ref 0–149)
VLDL Cholesterol Cal: 41 mg/dL — ABNORMAL HIGH (ref 5–40)

## 2019-08-13 ENCOUNTER — Ambulatory Visit (INDEPENDENT_AMBULATORY_CARE_PROVIDER_SITE_OTHER): Payer: Self-pay | Admitting: Family Medicine

## 2019-08-13 ENCOUNTER — Ambulatory Visit (HOSPITAL_COMMUNITY)
Admission: RE | Admit: 2019-08-13 | Discharge: 2019-08-13 | Disposition: A | Discharge status: Home / Self Care | Payer: Self-pay | Source: Ambulatory Visit | Attending: Family Medicine | Admitting: Family Medicine

## 2019-08-13 ENCOUNTER — Encounter: Payer: Self-pay | Admitting: Family Medicine

## 2019-08-13 ENCOUNTER — Other Ambulatory Visit: Payer: Self-pay

## 2019-08-13 VITALS — BP 110/68 | HR 95 | Wt 338.1 lb

## 2019-08-13 DIAGNOSIS — M545 Low back pain: Secondary | ICD-10-CM

## 2019-08-13 DIAGNOSIS — E11628 Type 2 diabetes mellitus with other skin complications: Secondary | ICD-10-CM

## 2019-08-13 DIAGNOSIS — R0602 Shortness of breath: Secondary | ICD-10-CM

## 2019-08-13 DIAGNOSIS — J4520 Mild intermittent asthma, uncomplicated: Secondary | ICD-10-CM

## 2019-08-13 DIAGNOSIS — G8929 Other chronic pain: Secondary | ICD-10-CM

## 2019-08-13 LAB — GLUCOSE, POCT (MANUAL RESULT ENTRY): POC Glucose: 296 mg/dl — AB (ref 70–99)

## 2019-08-13 MED ORDER — ALBUTEROL SULFATE HFA 108 (90 BASE) MCG/ACT IN AERS: 2.0000 | INHALATION_SPRAY | Freq: Four times a day (QID) | RESPIRATORY_TRACT | 4 refills | Status: DC | PRN

## 2019-08-13 NOTE — Patient Instructions (Signed)
It was great seeing you today!   I'd like to see you back 2 months for follow up but if you need to be seen earlier than that for any new issues we're happy to fit you in, just give [place] a call!   If you have questions or concerns please do not hesitate to call at [phone].  Dr. Rushie [facility]

## 2019-08-13 NOTE — Progress Notes (Signed)
Ambulatory walk  92% OXYGEN-101BPM

## 2019-08-13 NOTE — Progress Notes (Signed)
Subjective:  Amanda Larson is a 53 y.o. female who presents to the [facility] today with a chief complaint of follow up.       HPI:   Amanda Larson here for follow up.   SOB Patient reported worsening shortness of breath started about a week ago when the weather change.  She has an albuterol inhaler as she says " I do not want to get addicted to it."  Denies chest pain, alteration, or extremity edema, paroxysmal nocturnal dyspnea and cough.  Reports he normally gets short of breath when the weather changes occurred about a week ago.  Reports her shortness of breath is worse when she goes outside.   DM Eats 2-3 meals a day and occasionally snacks between meals. Denies missing any doses of DM medications and takes Metformin in the morning. Denies increased thirst, hunger, or frequent urination. Checks glucose with glucometer first thing in the morning when she wakes up.  Has recently stopped drinking slushy's daily however has been on 8 in the pumpkin cream pie from the grocery store regularly.    Back pain Chronic back pain worse recently when the weather changed.  States the intensity has gotten worse.  States "it takes my breathe" and is described as sharp and radiates across the lower back.  Better with leaning forward.  Reports she has briefly been told she has arthritis in her back.  She was told that she needed to have surgery however she did not want to have surgery.  Denies loss of bowel and bladder function, radiation into the legs, numbness, tingling, loss of sensation and recent trauma. Has not taken  Meloxicam recently however states this medication  in the past provided relief.    PMFSH, medications and smoking status reviewed.   ROS: See HPI     Objective:  Physical Exam: BP 110/68   Pulse 95   Wt (!) 153.4 kg   LMP 01/17/2014   SpO2 95%   BMI 63.89 kg/m     GEN: obese female, in no acute distress CV: regular rate and rhythm, no murmurs appreciated   RESP: no increased work of breathing, clear to ascultation bilaterally with no crackles, wheezes, or rhonchi, and no respiratory distress ABD: obese abdomen, soft, nontender, non-distended, bowel sounds active MSK: no lower extremity edema edema, or calf tenderness SKIN: warm, dry NEURO: grossly normal, moves all extremities appropriately    No results found for this or any previous visit (from the past 72 hour(s)).   Assessment/Plan:  Asthma Refilled her prescription albuterol.  Advised patient to take albuterol as needed.  EKG obtained normal sinus rhythm, no ST elevations, HR 79, is not concerning for acute MI or ischemia.  During ambulatory desaturation test patient maintain oxygen saturation above 92%.  No calf tenderness on exam.  No recent travel, prolonged immobilization, recent surgeries or history of VTE.   Diabetes (HCC) Today CBG 296, patient states she last ate 30 minutes prior to arrival.  Patient amenable to GLP-1 with benefits of weight loss as her BMI is ~ 64 if medication is low cost.  She has been taking her Metformin daily and stop drinking daily slushy's however had picked up eating cream pumpkin pies.  Discussed with patient the need to the need to make healthy eating habits.  - DM education -per pharmacy; discussion on starting Trulicity/GLP-1 if  medication assistance could be obtained - Continue Metformin XR 1500 daily   Chronic low back pain Patient to  take meloxicam for ongoing back pain as she reported not taking this medication.  Advised patient not to use concurrent NSAIDs.  No red flag symptoms identified.  No imaging needed at this time. She will follow up if pain does not improve with pain medication.     Orders Placed This Encounter  Procedures  . Glucose (CBG)           No orders of the defined types were placed in this encounter.   Health Maintenance reviewed - health release for eye exam ordered.  Pt to call and reschedule colonoscopy  consultation as she was told she is high risk.    Lyndee Hensen, DO PGY-1, [facility] 08/13/2019 1:55 PM

## 2019-08-17 NOTE — Assessment & Plan Note (Addendum)
Today CBG 296, patient states she last ate 30 minutes prior to arrival.  Patient amenable to GLP-1 with benefits of weight loss as her BMI is ~ 64 if medication is low cost.  She has been taking her Metformin daily and stop drinking daily slushy's however had picked up eating cream pumpkin pies.  Discussed with patient the need to the need to make healthy eating habits.  - DM education -per pharmacy; discussion on starting Trulicity/GLP-1 if  medication assistance could be obtained - Continue Metformin XR 1500 daily

## 2019-08-17 NOTE — Assessment & Plan Note (Signed)
Refilled her prescription albuterol.  Advised patient to take albuterol as needed.  EKG obtained normal sinus rhythm, no ST elevations, HR 79, is not concerning for acute MI or ischemia.  During ambulatory desaturation test patient maintain oxygen saturation above 92%.  No calf tenderness on exam.  No recent travel, prolonged immobilization, recent surgeries or history of VTE.

## 2019-08-17 NOTE — Assessment & Plan Note (Signed)
Patient to take meloxicam for ongoing back pain as she reported not taking this medication.  Advised patient not to use concurrent NSAIDs.  No red flag symptoms identified.  No imaging needed at this time. She will follow up if pain does not improve with pain medication.

## 2019-08-20 MED ORDER — ALBUTEROL SULFATE HFA 108 (90 BASE) MCG/ACT IN AERS: 2.0000 | INHALATION_SPRAY | Freq: Four times a day (QID) | RESPIRATORY_TRACT | 4 refills | Status: DC | PRN

## 2019-08-20 NOTE — Addendum Note (Signed)
Addended by: Ellwood Handler on: 08/20/2019 02:12 PM   Modules accepted: Orders

## 2019-08-20 NOTE — Addendum Note (Signed)
Addended by: Leavy Cella on: 08/20/2019 05:10 PM   Modules accepted: Orders

## 2019-08-20 NOTE — Progress Notes (Signed)
Patient assistance application for albuterol completed with assistance of Drexel Iha, [place].D.  Prescription by Dr. Andria Frames.

## 2019-08-21 NOTE — Progress Notes (Signed)
Patient ID: Amanda Larson, female   DOB: 12/30/1965, 53 y.o.   MRN: [id number] Reviewed and agree.

## 2019-08-24 ENCOUNTER — Other Ambulatory Visit: Payer: Self-pay | Admitting: *Deleted

## 2019-08-25 ENCOUNTER — Telehealth: Payer: Self-pay | Admitting: Pharmacist

## 2019-08-25 MED ORDER — CITALOPRAM HYDROBROMIDE 40 MG PO TABS: 40.0000 mg | ORAL_TABLET | Freq: Every day | ORAL | 3 refills | Status: DC

## 2019-08-25 NOTE — Telephone Encounter (Signed)
Patient Assistance Program Note (Date 12//15/2020)  Patient: Amanda Larson DOB: 07-25-1966 Medication (Patient Assistance Program): Ventolin ([place]) Enrollment Date: 08/24/2019 - 08/22/2020 Shipping Address: [place] [address] PCP: Madison Hickman  Called patient to notify her of [place] patient assistance program approval for Ventolin on 08/25/2019 at 4:03 PM. Informed patient medication will be shipped in 7-10 business days. Also, provided update about Trulicity prescription [employer]) -- appears [employer] did not receive application. Will refax [employer] application and provide patient update on application status when possible. Patient expressed appreciation.

## 2019-09-11 ENCOUNTER — Telehealth: Payer: Self-pay | Admitting: Pharmacist

## 2019-09-11 NOTE — Telephone Encounter (Signed)
Called patient on 09/11/2019 at 2:56 PM   Discussed with patient how I called [employer] yesterday and they rejected her patient assistance application due to the proof of income documentation being too dark. Likely due to how it was a picture copied/pasted to a word doc. Asked patient to bring income documents to [facility] clinic so they can be copied and given to Dr. Raymondo Band. Will refax application as soon as she brings documentation to clinic. Patient verbalized understanding and stated she will bring them sometime next week.  Patient has not received Ventolin inhaler from [employer]. States she received a letter from [employer] saying her "application needed more information". Told pt I will follow up with [employer] then I will follow up with her to provide her an update. Attempted to call [employer] today but office is closed for Holidays. Will re-attempt to call [employer] this upcoming week.  Zachery Conch, PharmD PGY2 Ambulatory Care Pharmacy Resident

## 2019-09-18 ENCOUNTER — Telehealth: Payer: Self-pay | Admitting: Pharmacist

## 2019-09-18 NOTE — Telephone Encounter (Signed)
Called patient on 09/18/2019 at 4:50 PM and left HIPAA-compliant VM with instructions to call Family Medicine clinic back   Patient still has not dropped of proof of income documentation to fax to [facility]. Called pt to discuss issues with dropping off documentation. Will await patient's call.  Zachery Conch, PharmD PGY2 Ambulatory Care Pharmacy Resident

## 2019-09-19 ENCOUNTER — Other Ambulatory Visit: Payer: Self-pay | Admitting: Family Medicine

## 2019-09-19 DIAGNOSIS — M545 Low back pain, unspecified: Secondary | ICD-10-CM

## 2019-09-19 DIAGNOSIS — G8929 Other chronic pain: Secondary | ICD-10-CM

## 2019-09-22 ENCOUNTER — Telehealth (INDEPENDENT_AMBULATORY_CARE_PROVIDER_SITE_OTHER): Payer: Self-pay | Admitting: Family Medicine

## 2019-09-22 ENCOUNTER — Other Ambulatory Visit: Payer: Self-pay

## 2019-09-22 DIAGNOSIS — Z20822 Contact with and (suspected) exposure to covid-19: Secondary | ICD-10-CM

## 2019-09-23 ENCOUNTER — Ambulatory Visit: Payer: Self-pay | Attending: Internal Medicine

## 2019-09-23 DIAGNOSIS — U071 COVID-19: Secondary | ICD-10-CM | POA: Insufficient documentation

## 2019-09-23 DIAGNOSIS — Z20822 Contact with and (suspected) exposure to covid-19: Secondary | ICD-10-CM

## 2019-09-24 DIAGNOSIS — Z20822 Contact with and (suspected) exposure to covid-19: Secondary | ICD-10-CM | POA: Insufficient documentation

## 2019-09-24 LAB — NOVEL CORONAVIRUS, NAA: SARS-CoV-2, NAA: DETECTED — AB

## 2019-09-24 NOTE — Progress Notes (Signed)
[facility] Telemedicine Visit  Patient consented to have virtual visit. Method of visit: Video  Encounter participants: Patient: Amanda Larson - located at home Provider: Myrene Buddy - located at [facility]mc  Chief Complaint: Cough, loss of taste and smell  HPI: 54 year old female who presents as a video visit for 5-day history of dry cough, mild shortness of breath at night, loss of sense of taste and smell.  Patient states that her granddaughter developed these same symptoms roughly 2 weeks ago.  Granddaughter did not undergo any Covid testing, but her symptoms did improve and eventually resolved within about a week or so.  The patient developed the same symptoms.  She describes her shortness of breath as very mild trouble catching her breath with activity and at night.  She uses albuterol which does improve her symptoms and she is able to sleep comfortably.  Patient usually requiring 1 or 2 puffs/day over this time.  Patient feels like she is getting a little worse throughout this time, has felt some fatigue.  ROS: per HPI  Pertinent PMHx: Obesity, asthma, diabetes  Exam:  General: Well-appearing Caucasian female, no acute distress over video Respiratory: Able speak in clear coherent sentences, no distress on video, no accessory muscle use Psych: Very pleasant, no tangential thought process, very appropriate  Assessment/Plan:  Suspected COVID-19 virus infection Patient symptoms are highly suspicious for COVID-19 infection.  The timetable of both her and her granddaughter symptoms fit this picture pretty well.  We will get testing.  Patient likely qualifies for bamlanivimab administration given her age, and medical problems.  Will refer for treatment if positive.  Discussed when patient should present to emergency department if her symptoms worsen.  Can continue taking albuterol using over-the-counter medications as needed.    Time spent during visit with  patient: 8 minutes

## 2019-09-24 NOTE — Assessment & Plan Note (Signed)
Patient symptoms are highly suspicious for COVID-19 infection.  The timetable of both her and her granddaughter symptoms fit this picture pretty well.  We will get testing.  Patient likely qualifies for bamlanivimab administration given her age, and medical problems.  Will refer for treatment if positive.  Discussed when patient should present to emergency department if her symptoms worsen.  Can continue taking albuterol using over-the-counter medications as needed.

## 2019-09-28 ENCOUNTER — Telehealth: Payer: Self-pay | Admitting: Pharmacist

## 2019-09-28 NOTE — Telephone Encounter (Signed)
Called patient on 09/28/2019 at 3:51 PM   Patient is unable to bring proof of income documentation by clinic because she has COVID-19. Patient will mail documentation instead. Will apply for Trulicity through [employer] and re-submit application for albuterol prn through [employer] once financial documentation is received.  Zachery Conch, PharmD PGY2 Ambulatory Care Pharmacy Resident

## 2019-10-22 ENCOUNTER — Telehealth: Payer: Self-pay | Admitting: Pharmacist

## 2019-10-22 NOTE — Telephone Encounter (Signed)
Called patient on 10/22/2019 at 10:39 AM   Patient states she has recovered from COVID-19 and is feeling better. Discussed with patient that she needs to bring income documentation by clinic in order to fax patient assistance applications for Trulicity (via [employer]) and albuterol (via [employer]). Patient states she will come by tomorrow (10/23/2019) and will give documentation to Dr. Raymondo Band.   Thank you for involving pharmacy to assist in providing this patient's care.   Zachery Conch, PharmD PGY2 Ambulatory Care Pharmacy Resident

## 2019-10-28 ENCOUNTER — Encounter: Payer: Self-pay | Admitting: Family Medicine

## 2019-10-28 ENCOUNTER — Other Ambulatory Visit: Payer: Self-pay

## 2019-10-28 ENCOUNTER — Ambulatory Visit (INDEPENDENT_AMBULATORY_CARE_PROVIDER_SITE_OTHER): Payer: Self-pay | Admitting: Family Medicine

## 2019-10-28 VITALS — BP 132/84 | HR 82 | Wt 328.0 lb

## 2019-10-28 DIAGNOSIS — E11628 Type 2 diabetes mellitus with other skin complications: Secondary | ICD-10-CM

## 2019-10-28 DIAGNOSIS — L409 Psoriasis, unspecified: Secondary | ICD-10-CM

## 2019-10-28 LAB — POCT GLYCOSYLATED HEMOGLOBIN (HGB A1C): HbA1c, POC (controlled diabetic range): 9 % — AB (ref 0.0–7.0)

## 2019-10-28 MED ORDER — TRIAMCINOLONE ACETONIDE 0.5 % EX OINT: 1.0000 "application " | TOPICAL_OINTMENT | Freq: Two times a day (BID) | CUTANEOUS | 0 refills | Status: DC

## 2019-10-28 MED ORDER — TRULICITY 0.75 MG/0.5ML ~~LOC~~ SOAJ: 0.7500 mg | SUBCUTANEOUS | Status: DC

## 2019-10-28 NOTE — Assessment & Plan Note (Signed)
Prescription 0.5% Kenalog sent to pharmacy -Follow-up with not improving

## 2019-10-28 NOTE — Assessment & Plan Note (Addendum)
Home regimen: Metfomrin XR 1500 mg.  A1c 9.0 today from 8.1 previously.  Encouraged continued diet rich in vegetables and complex carbs.  Decrease simple carbs and refined sugar intake. Work on increasing activity level.  BMI ~62. Will start GLP-1 agonist (Trulicity)  Foot exam: UTD Urine microalbumine: UTD  Eye exam: asked patient to schedule however she doesn't have insurance.  - Start Trulicity, starter pack given.  Patient turned in her paperwork for [facility] this morning.  Hopefully she will be able to continue this medication.

## 2019-10-28 NOTE — Patient Instructions (Addendum)
It was great seeing you today!   I'd like to see you back in 3 months for check up but if you need to be seen earlier than that for any new issues we're happy to fit you in, just give [place] a call!  - You are starting Trulicity. Each pen is single dose.   - If you have any of the following side effects that aren't getting better please call the office: nausea, diarrhea, vomiting, abdominal pain, decreased appetite.    If you have questions or concerns please do not hesitate to call at [phone].  Dr. Katherina [facility]

## 2019-10-28 NOTE — Progress Notes (Signed)
Subjective:  Amanda Larson is a 54 y.o. female who presents to the [facility] today with a  Chief Complaint  Patient presents with  . Diabetes   .    HPI:    Amanda Larson here for follow up.  Diabetes Mellitus  Eats 3 meals a day and 1-2 snacks. Has cut out drinking slushys but has begun eating multiple 2 stools daily.  She is aware this is bad for her blood sugars.  Most recent blood sugars 300-170. Denies increased thirst, hunger, or frequent urination. Does not check her blood sugar regularly as she does not have a glucometer.    Psoriasis  Patient with chronic rashes to bilateral upper and lower extremities.Hx of psoriasis.  Left knee improved however right knee flared and is worsening.   She has used a steroid topical in the past with relief. Denies pain and drainage.    ROS: See HPI     Objective:  Physical Exam: BP 132/84   Pulse 82   Wt (!) 328 lb (148.8 kg)   LMP 01/17/2014   SpO2 99%   BMI 61.98 kg/m    GEN: pleasant female, in no acute distress  CV: regular rate distal pulses intact  RESP: no increased work of breathing ABD: obese abdomen  MSK: moves all extremities appropriately SKIN: silvery plaques and patches scattered on bilateral upper and lower extremities extensor services, anterior chest and face   Results for orders placed or performed in visit on 10/28/19 (from the past 72 hour(s))  HgB A1c     Status: Abnormal   Collection Time: 10/28/19  1:45 PM  Result Value Ref Range   Hemoglobin A1C     HbA1c POC (<> result, manual entry)     HbA1c, POC (prediabetic range)     HbA1c, POC (controlled diabetic range) 9.0 (A) 0.0 - 7.0 %     Assessment/Plan:  Psoriasis Prescription 0.5% Kenalog sent to pharmacy -Follow-up with not improving  Diabetes (HCC) Home regimen: Metfomrin XR 1500 mg.  A1c 9.0 today from 8.1 previously.  Encouraged continued diet rich in vegetables and complex carbs.  Decrease simple carbs and refined sugar intake.  Work on increasing activity level.  BMI ~62. Will start GLP-1 agonist (Trulicity)  Foot exam: UTD Urine microalbumine: UTD  Eye exam: asked patient to schedule however she doesn't have insurance.  - Start Trulicity, starter pack given.  Patient turned in her paperwork for [facility] this morning.  Hopefully she will be able to continue this medication.         Orders Placed This Encounter  Procedures  . HgB A1c    Meds ordered this encounter  Medications  . DISCONTD: triamcinolone ointment (KENALOG) 0.5 %    Sig: Apply 1 application topically 2 (two) times daily.    Dispense:  30 g    Refill:  0  . Dulaglutide (TRULICITY) 0.75 MG/0.5ML SOPN    Sig: Inject 0.75 mg into the skin once a week.    Dispense:     . triamcinolone ointment (KENALOG) 0.5 %    Sig: Apply 1 application topically 2 (two) times daily.    Dispense:  30 g    Refill:  0    Health Maintenance reviewed - patient needs pneumococcal and tetanus booster.  Additionally asked patient to schedule an eye exam and she will also need a colonoscopy.  She states that this has been difficult as she does not have insurance.   Seward Meth  Susa Simmonds DO PGY-1, [facility] 10/28/2019 1:36 PM

## 2019-11-06 ENCOUNTER — Other Ambulatory Visit: Payer: Self-pay | Admitting: Family Medicine

## 2019-11-06 DIAGNOSIS — M545 Low back pain, unspecified: Secondary | ICD-10-CM

## 2019-11-06 DIAGNOSIS — G8929 Other chronic pain: Secondary | ICD-10-CM

## 2019-11-13 ENCOUNTER — Encounter: Payer: Self-pay | Admitting: Family Medicine

## 2019-11-17 ENCOUNTER — Ambulatory Visit: Payer: Self-pay | Admitting: Pharmacist

## 2019-11-17 DIAGNOSIS — E11628 Type 2 diabetes mellitus with other skin complications: Secondary | ICD-10-CM

## 2019-11-17 NOTE — Progress Notes (Signed)
   Outreach Note  11/17/2019 Name: Amanda Larson MRN: [id number] DOB: 1966-02-23  Call placed to [employer] to confirm patient assistance program status.  Patient is enrolled with [place] [employer] Patient Assistance program effective 3.4.21 until 3.4.22 for Trulicity.  A 9-month supply was shipped out on 11/13/19.  She will need to call [facility] on behalf of [employer] PAP at [phone] for refills every 3-4 months.    SIGNATURE Kieth Brightly, PharmD, BCPS Clinical Pharmacist, [facility] [place]  [facility]  Direct Dial: [phone]

## 2019-11-26 ENCOUNTER — Telehealth: Payer: Self-pay | Admitting: Pharmacist

## 2019-11-26 NOTE — Telephone Encounter (Signed)
Patient Assistance Program Note (Date 11/26/2019)  Patient: Amanda Larson DOB: 1965-11-05 Medication (Patient Assistance Program): Trulicity [place] C[employer]) Enrollment Date: 11/12/2019 - 11/11/2020 Shipping Address:  [address] [place] [address] PCP: Doralee Albino   Patient confirms she has received Trulicity in the mail. She experienced GI intolerance for 2 weeks, however, she confirms she is not experiencing GI upset anymore. She confirms she is currently tolerating Trulicity (without experiencing side effects) and would prefer to continue Trulicity.    Medication (Patient Assistance Program): Ventolin ([employer]) Enrollment Date: 09/08/2019 - 09/06/2020 Shipping Address:  [address] [place] [address] PCP: Doralee Albino   Although patient has been enrolled in [employer] patient assistance program since 08/2019 there have been issues with application. Confirmed with [employer] representative that patient will receive medication via mail in ~10 business days. Provided patient with status update - she verbalized understanding.   Provided patient with phone numbers for each patient assistance program and informed patient she will have to re-apply to these programs annually (will have to re-submit proof of income annually). Patient verbalized understanding and expressed appreciation for financial assistance with medications. Advised patient to contact [facility] pharmacist with any additional concerns regarding medications. Patient verbalized understanding.   Thank you for involving pharmacy to assist in providing this patient's care.   Zachery Conch, PharmD PGY2 Ambulatory Care Pharmacy Resident

## 2019-12-04 ENCOUNTER — Other Ambulatory Visit: Payer: Self-pay | Admitting: Family Medicine

## 2019-12-04 DIAGNOSIS — G8929 Other chronic pain: Secondary | ICD-10-CM

## 2019-12-04 DIAGNOSIS — M545 Low back pain, unspecified: Secondary | ICD-10-CM

## 2020-01-07 ENCOUNTER — Other Ambulatory Visit: Payer: Self-pay | Admitting: Family Medicine

## 2020-01-07 DIAGNOSIS — G8929 Other chronic pain: Secondary | ICD-10-CM

## 2020-01-07 DIAGNOSIS — M545 Low back pain, unspecified: Secondary | ICD-10-CM

## 2020-02-01 ENCOUNTER — Telehealth (INDEPENDENT_AMBULATORY_CARE_PROVIDER_SITE_OTHER): Payer: Self-pay | Admitting: Family Medicine

## 2020-02-01 DIAGNOSIS — Z91199 Patient's noncompliance with other medical treatment and regimen due to unspecified reason: Secondary | ICD-10-CM

## 2020-02-01 DIAGNOSIS — Z5329 Procedure and treatment not carried out because of patient's decision for other reasons: Secondary | ICD-10-CM

## 2020-02-01 NOTE — Progress Notes (Signed)
Multiple attempts made to call Ms. Hendren without success.  No voicemail was available to leave a message.

## 2020-02-01 NOTE — Progress Notes (Signed)
LM for patient apologizing for running late but explained that the provider will try to reach her again.  Myrl Bynum,CMA

## 2020-02-11 ENCOUNTER — Other Ambulatory Visit: Payer: Self-pay | Admitting: Family Medicine

## 2020-02-11 DIAGNOSIS — G8929 Other chronic pain: Secondary | ICD-10-CM

## 2020-03-09 ENCOUNTER — Ambulatory Visit (INDEPENDENT_AMBULATORY_CARE_PROVIDER_SITE_OTHER): Payer: No Payment, Other | Admitting: Clinical

## 2020-03-09 ENCOUNTER — Other Ambulatory Visit: Payer: Self-pay

## 2020-03-09 DIAGNOSIS — F4310 Post-traumatic stress disorder, unspecified: Secondary | ICD-10-CM | POA: Diagnosis not present

## 2020-03-09 DIAGNOSIS — F331 Major depressive disorder, recurrent, moderate: Secondary | ICD-10-CM

## 2020-03-10 ENCOUNTER — Encounter: Payer: Self-pay | Admitting: Family Medicine

## 2020-03-10 ENCOUNTER — Ambulatory Visit (INDEPENDENT_AMBULATORY_CARE_PROVIDER_SITE_OTHER): Payer: Self-pay | Admitting: Family Medicine

## 2020-03-10 VITALS — BP 114/72 | HR 78 | Ht 61.0 in | Wt 323.2 lb

## 2020-03-10 DIAGNOSIS — E11628 Type 2 diabetes mellitus with other skin complications: Secondary | ICD-10-CM

## 2020-03-10 LAB — POCT GLYCOSYLATED HEMOGLOBIN (HGB A1C): HbA1c, POC (controlled diabetic range): 7.9 % — AB (ref 0.0–7.0)

## 2020-03-10 MED ORDER — TRULICITY 1.5 MG/0.5ML ~~LOC~~ SOAJ: 1.5000 mg | SUBCUTANEOUS | 4 refills | Status: DC
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
## 2020-03-10 NOTE — Patient Instructions (Signed)
It was great seeing you today!  Please check-out at the front desk before leaving the clinic. I'd like to see you back in 1 month but if you need to be seen earlier than that for any new issues we're happy to fit you in, just give [place] a call!  Visit Remembers: - Call your the pharmacy about your prescriptions - Continue to work on your healthy eating habits and incorporating exercise into your daily life.  - Your goal is to have an blood sugars under 140  - Medicine Changes: increase Trulicity to 1.5 mg weekly      Regarding lab work today:  Due to recent changes in healthcare laws, you may see the results of your imaging and laboratory studies on MyChart before your provider has had a chance to review them.  I understand that in some cases there may be results that are confusing or concerning to you. Not all laboratory results come back in the same time frame and you may be waiting for multiple results in order to interpret others.  Please give [place] 72 hours in order for your provider to thoroughly review all the results before contacting the office for clarification of your results. If everything is normal, you will get a letter in the mail or a message in My Chart. Please give [place] a call if you do not hear from [place] after 2 weeks.  Please bring all of your medications with you to each visit.    If you haven't already, sign up for My Chart to have easy access to your labs results, and communication with your primary care physician.  Feel free to call with any questions or concerns at any time, at [phone].   Take care,  Dr. Katherina [facility]

## 2020-03-10 NOTE — Progress Notes (Signed)
    SUBJECTIVE:   CHIEF COMPLAINT / HPI:   Diabetes Mellitus  Diet: like to eat cookies and occasionally cakes. Stopped drinking slushys. Denies missing any doses of DM medications and takes Trulicity and Metformin. Denies increased thirst, hunger, or frequent urination. Checks glucose with glucometer and recent CGBs 140-200.   PERTINENT  PMH / PSH: obesity, T2DM,   OBJECTIVE:   BP 114/72   Pulse 78   Ht 5\' 1"  (1.549 m)   Wt (!) 323 lb 4 oz (146.6 kg)   LMP 01/17/2014   SpO2 95%   BMI 61.08 kg/m   GEN: pleasant female in no acute distress  CV: regular rate and rhythm, no murmurs appreciated  RESP: no increased work of breathing, clear to ascultation bilaterally ABD:  Obese abdomen, soft, nontender, nondistended MSK: no edema, or cyanosis noted SKIN: warm, dry    ASSESSMENT/PLAN:   Diabetes (HCC) Home regimen: Metfomrin XR 1500 mg.  A1c 7.9 today from 9.0 previously.  Encouraged continued diet rich in vegetables and complex carbs.  Decrease simple carbs and refined sugar intake. Work on increasing activity level.  BMI ~61. Paitent has lost 5 pounds since last visit.  She is motivated to get her blood sugars under control.  Will increase Trulicity 1.5 mg/weekly.   Will start GLP-1 agonist (Trulicity)  Foot exam: Due August 2021 Urine microalbumine: Due August 2021 Eye exam: Does not have insurance therefore unable to get eye exam at this time.    Health care Maintainence :  Follow up regarding insurance. Patient needs colonoscopy, mammogram and vaccines. Patient does not have orange card anymore reports disability was approved and will likely get Medicaid. Consider referral to CCM for orange card in the future.    September 2021, DO [facility]

## 2020-03-11 NOTE — Assessment & Plan Note (Signed)
Home regimen: Metfomrin XR 1500 mg.  A1c 7.9 today from 9.0 previously.  Encouraged continued diet rich in vegetables and complex carbs.  Decrease simple carbs and refined sugar intake. Work on increasing activity level.  BMI ~61. Paitent has lost 5 pounds since last visit.  She is motivated to get her blood sugars under control.  Will increase Trulicity 1.5 mg/weekly.   Will start GLP-1 agonist (Trulicity)  Foot exam: Due August 2021 Urine microalbumine: Due August 2021 Eye exam: Does not have insurance therefore unable to get eye exam at this time.

## 2020-03-15 ENCOUNTER — Other Ambulatory Visit: Payer: Self-pay | Admitting: Family Medicine

## 2020-03-16 ENCOUNTER — Telehealth: Payer: Self-pay | Admitting: Family Medicine

## 2020-03-16 ENCOUNTER — Other Ambulatory Visit: Payer: Self-pay | Admitting: Family Medicine

## 2020-03-16 DIAGNOSIS — G8929 Other chronic pain: Secondary | ICD-10-CM

## 2020-03-16 NOTE — Telephone Encounter (Signed)
Pt wanting to speak to the doctor about her medication that was sent through [facility] and should have gone to [place].    Trulicity  Stated she is running out of medicine;  Ph # [phone]; Nurse was messaged who said she would give pt. a call but pt wanted message sent to doctor.

## 2020-03-16 NOTE — Telephone Encounter (Signed)
Will forward to Dr. Raymondo Band and the pharmacy team.  Will need assistance sending her script to [facility]. Keano Guggenheim,CMA

## 2020-03-17 ENCOUNTER — Telehealth: Payer: Self-pay | Admitting: Pharmacist

## 2020-03-17 MED ORDER — METFORMIN HCL ER 500 MG PO TB24: ORAL_TABLET | ORAL | 0 refills | Status: DC

## 2020-03-17 NOTE — Telephone Encounter (Signed)
Contacted patient regarding Science writer. Patient is aware that application has been faxed and should receive medication in 10-14 days.  Fabio Neighbors, PharmD PGY2 Ambulatory Care Resident

## 2020-03-17 NOTE — Telephone Encounter (Signed)
Called patient on 03/17/2020 and left voicemail message to contact the pharmacy team. Application for Trulicity through [employer] has been faxed. Patient should anticipate delivery of Trulicity in 10-14 days, may come quicker.   Fabio Neighbors, PharmD PGY2 Ambulatory Care Resident

## 2020-03-30 ENCOUNTER — Ambulatory Visit (INDEPENDENT_AMBULATORY_CARE_PROVIDER_SITE_OTHER): Payer: No Payment, Other | Admitting: Clinical

## 2020-03-30 ENCOUNTER — Ambulatory Visit (INDEPENDENT_AMBULATORY_CARE_PROVIDER_SITE_OTHER): Payer: No Payment, Other | Admitting: Psychiatry

## 2020-03-30 ENCOUNTER — Other Ambulatory Visit: Payer: Self-pay

## 2020-03-30 ENCOUNTER — Encounter (HOSPITAL_COMMUNITY): Payer: Self-pay | Admitting: Psychiatry

## 2020-03-30 DIAGNOSIS — F319 Bipolar disorder, unspecified: Secondary | ICD-10-CM

## 2020-03-30 DIAGNOSIS — F411 Generalized anxiety disorder: Secondary | ICD-10-CM

## 2020-03-30 DIAGNOSIS — F3341 Major depressive disorder, recurrent, in partial remission: Secondary | ICD-10-CM | POA: Diagnosis not present

## 2020-03-30 MED ORDER — CARIPRAZINE HCL 1.5 MG PO CAPS: 1.5000 mg | ORAL_CAPSULE | Freq: Every day | ORAL | 2 refills | Status: DC

## 2020-03-30 MED ORDER — HYDROXYZINE HCL 10 MG PO TABS: 10.0000 mg | ORAL_TABLET | Freq: Three times a day (TID) | ORAL | 2 refills | Status: DC | PRN

## 2020-03-30 MED ORDER — MELATONIN 1 MG PO CAPS: 1.0000 mg | ORAL_CAPSULE | Freq: Every evening | ORAL | 2 refills | Status: DC

## 2020-03-30 MED ORDER — CITALOPRAM HYDROBROMIDE 40 MG PO TABS: 40.0000 mg | ORAL_TABLET | Freq: Every day | ORAL | 2 refills | Status: DC

## 2020-03-30 NOTE — Progress Notes (Signed)
   THERAPIST PROGRESS NOTE  Session Time: 45 minutes  Participation Level: Active  Behavioral Response: CasualAlertEuthymic  Type of Therapy: Individual Therapy  Treatment Goals addressed: Diagnosis: Depression  Interventions: CBT  Summary:  Amanda Larson is a 54 y.o. female who presents for the scheduled session oriented times five, appropriately dressed, and cooperative. Client denied hallucinations and delusions. Client reported today she is feeling better since the last session. Client reported she has been awarded disability which has been a big relief for her. Client reported recently she has been worried about her 78 year old grandson that was living with her. Client reported her grandson was battling with mental health problems for awhile but only recently agreed to treatment. Client discussed "it was driving her crazy" feeling empathetic ut also frustrated because it made her feel on edge. Client reported she has relief now that he is in treatment.  Client reported since the last session she has made changes to her eating her habits and has lost weight and improved her blood sugar levels. Client reported physically she feels good. Client reported she has been sleeping better also.  Client reported she can tell a noticeable improvement in her mood and her way of thinking because of how her marriage has improved and how she feels about herself. Client compared to before when she experienced crying spells, psychomotor agitation, and fatigued. Client stated, "I've got to be happy with working on me". Client reported a positive shift occurred when she started to work on her perspective such as not accusing or picking arguments with her husband about assumptions.  Client reported she is doing well and will continue to work on fact checking her perspective before reacting to things.     Suicidal/Homicidal: Nowithout intent/plan  Therapist Response:  Therapist initiated the session  by asking the client how she is currently feeling. Therapist probed about how the client has progressed since last seen and what has affected her current mood. Therapist allowed time to focus on the clients thoughts and feelings. Therapist collaborated with the client to identify what changes she has made and will continue to work toward to improve her self care and interpersonal relationships.  Therapist assigned the client homework to continue working identifying negative thoughts and actions and challenging that respond in a healthier way. Therapist scheduled client for next appointment.    Plan: Return again in 3 weeks for individual therapy.  Diagnosis: Bipolar 1 disorder   [name] [name], LCSW 03/30/2020
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
## 2020-04-14 ENCOUNTER — Other Ambulatory Visit: Payer: Self-pay | Admitting: Family Medicine

## 2020-04-14 DIAGNOSIS — G8929 Other chronic pain: Secondary | ICD-10-CM

## 2020-04-20 ENCOUNTER — Encounter: Payer: Self-pay | Admitting: Family Medicine

## 2020-04-20 ENCOUNTER — Ambulatory Visit (INDEPENDENT_AMBULATORY_CARE_PROVIDER_SITE_OTHER): Payer: Self-pay | Admitting: Family Medicine

## 2020-04-20 ENCOUNTER — Other Ambulatory Visit: Payer: Self-pay

## 2020-04-20 VITALS — BP 124/62 | HR 83 | Ht 61.0 in | Wt 313.1 lb

## 2020-04-20 DIAGNOSIS — E0800 Diabetes mellitus due to underlying condition with hyperosmolarity without nonketotic hyperglycemic-hyperosmolar coma (NKHHC): Secondary | ICD-10-CM

## 2020-04-20 DIAGNOSIS — R739 Hyperglycemia, unspecified: Secondary | ICD-10-CM

## 2020-04-20 LAB — GLUCOSE, POCT (MANUAL RESULT ENTRY): POC Glucose: 165 mg/dl — AB (ref 70–99)

## 2020-04-20 NOTE — Patient Instructions (Signed)
It was great seeing you today!  Please check-out at the front desk before leaving the clinic. I'd like to see you back 1-2 months for followup but if you need to be seen earlier than that for any new issues we're happy to fit you in, just give [place] a call!  Visit Remembers: - Continue taking Trulicity as prescribed.  We are heading in the right direction!  - Continue to work on your healthy eating habits and incorporating exercise into your daily life.  - Your goal is to have an BP < 120/80   Please bring all of your medications with you to each visit.    If you haven't already, sign up for My Chart to have easy access to your labs results, and communication with your primary care physician.  Feel free to call with any questions or concerns at any time, at [phone].   Take care,  Dr. Katherina [facility]

## 2020-04-20 NOTE — Progress Notes (Signed)
   SUBJECTIVE:   CHIEF COMPLAINT / HPI:   Chief Complaint  Patient presents with  . Follow-up     Amanda Larson is a 54 y.o. female here for follow up.  Diabetes Mellitus  Started snacking on protein bars.  Has stopped eating cookies, tootsie rolls and slushy's. Denies missing any doses of DM medications and takes Metformin and Trulicity . Denies increased thirst, hunger, or frequent urination. Checks glucose with glucometer before eating in the morning. States her blood sugars have been 130s-160s.     PERTINENT  PMH / PSH: reviewed and updated as appropriate   OBJECTIVE:   BP 124/62   Pulse 83   Ht 5\' 1"  (1.549 m)   Wt (!) 313 lb 2 oz (142 kg)   LMP 01/17/2014   SpO2 96%   BMI 59.16 kg/m   GEN: pleasant well developed female, in no acute distress  CV: regular rate and rhythm, no murmurs appreciated  RESP: no increased work of breathing, clear to ascultation bilaterally  ABD:  Obese abdomen  SKIN: warm, dry, psoriatic plaques diffusely  NEURO: grossly normal, moves all extremities appropriately PSYCH: Normal affect, appropriate speech and behavior    ASSESSMENT/PLAN:   Diabetes mellitus due to underlying condition with hyperosmolarity without coma (HCC) Home regimen:Metfomrin XR 1500 mg and Trulicity 1.5 mg/weekly.  A1c is downtrending 7.9 previously from 9.0.Encouraged continued diet rich in vegetables and complex carbs.Decrease simple carbs and refined sugar intake.Work on increasing activity level.Body mass index is 59.16 kg/m. Paitent has lost 10 pounds since last visit.  She is motivated to get her blood sugars under control.  Foot exam:complete at next visit Urine microalbumine:complete at next visit Eye exam:insurance starts 9/1, advised pt to get eye exam      Health care Maintainence :  Medicare nsurance starts Sept 1, per pt. Patient needs colonoscopy, mammogram and vaccines.  COVID vaccine given today.     06-21-2003, DO PGY-2,  [facility] 04/20/2020

## 2020-04-20 NOTE — Assessment & Plan Note (Addendum)
Home regimen:Metfomrin XR 1500 mg and Trulicity 1.5 mg/weekly.  A1c is downtrending 7.9 previously from 9.0.Encouraged continued diet rich in vegetables and complex carbs.Decrease simple carbs and refined sugar intake.Work on increasing activity level.Body mass index is 59.16 kg/m. Paitent has lost 10 pounds since last visit.  She is motivated to get her blood sugars under control.  Foot exam:complete at next visit Urine microalbumine:complete at next visit Eye exam:insurance starts 9/1, advised pt to get eye exam

## 2020-04-27 ENCOUNTER — Other Ambulatory Visit: Payer: Self-pay

## 2020-04-27 ENCOUNTER — Ambulatory Visit (INDEPENDENT_AMBULATORY_CARE_PROVIDER_SITE_OTHER): Payer: No Payment, Other | Admitting: Clinical

## 2020-04-27 DIAGNOSIS — F319 Bipolar disorder, unspecified: Secondary | ICD-10-CM

## 2020-04-27 NOTE — Progress Notes (Signed)
   THERAPIST PROGRESS NOTE Virtual Visit via Video Note  I connected with Amanda Larson on 04/27/20 at  1:00 PM EDT by a video enabled telemedicine application and verified that I am speaking with the correct person using two identifiers.  Location: Patient: Home Provider: Office   I discussed the limitations of evaluation and management by telemedicine and the availability of in person appointments. The patient expressed understanding and agreed to proceed.   Follow Up Instructions:    I discussed the assessment and treatment plan with the patient. The patient was provided an opportunity to ask questions and all were answered. The patient agreed with the plan and demonstrated an understanding of the instructions.   The patient was advised to call back or seek an in-person evaluation if the symptoms worsen or if the condition fails to improve as anticipated.    Session Time: 16 minutes  Participation Level: Active  Behavioral Response: CasualAlertEuthymic  Type of Therapy: Individual Therapy  Treatment Goals addressed: Diagnosis: depression  Interventions: CBT and Supportive  Summary:  Amanda Larson is a 54 y.o. female who presents via video for the scheduled session. Client presented oriented times five, appropriately dressed, and friendly. Client denied hallucinations and delusions. Client reported her car was in the shop on today so she presented via video. Client reported since the last session her symptoms have continued to improve. Client reported her mood has elevated and remained stable. Client reported things at home are going well and not as stressful. Client reported her grandson is doing much better and everyone is getting along well. Client reported her sleep schedule and appetitive are well. Client reported she is responding well to her medication and taking it as prescribed.         Suicidal/Homicidal: Nowithout intent/plan  Therapist Response:   Therapist began the session by checking in and asking the client how she is doing.  Therapist allowed time to focus on the clients thoughts and feelings. Therapist asked open ended questions to assess the clients current mental health symptoms. Therapist discussed with the client continuing good self care practices e.g. sleep schedule, medication compliance and utilizing supports. Therapist assisted with scheduling the client for next appointments.   Plan: Return again in 5 weeks for individual therapy.  Diagnosis: Bipolar 1 disorder   [name], LCSW 04/27/2020

## 2020-05-11 ENCOUNTER — Ambulatory Visit (INDEPENDENT_AMBULATORY_CARE_PROVIDER_SITE_OTHER): Payer: Self-pay | Admitting: *Deleted

## 2020-05-11 ENCOUNTER — Other Ambulatory Visit: Payer: Self-pay

## 2020-05-11 DIAGNOSIS — Z23 Encounter for immunization: Secondary | ICD-10-CM

## 2020-05-11 NOTE — Progress Notes (Signed)
   Covid-19 Vaccination Clinic  Name:  Amanda Larson    MRN: [id number] DOB: [birth year]  05/11/2020  Amanda Larson was observed post Covid-19 immunization for 15 minutes without incident. She was provided with Vaccine Information Sheet and instruction to access the V-Safe system.   Amanda Larson was instructed to call 911 with any severe reactions post vaccine: Marland Kitchen Difficulty breathing  . Swelling of face and throat  . A fast heartbeat  . A bad rash all over body  . Dizziness and weakness

## 2020-05-23 ENCOUNTER — Other Ambulatory Visit: Payer: Self-pay | Admitting: Family Medicine

## 2020-05-23 DIAGNOSIS — M545 Low back pain, unspecified: Secondary | ICD-10-CM

## 2020-06-22 ENCOUNTER — Ambulatory Visit (HOSPITAL_COMMUNITY): Payer: No Payment, Other | Admitting: Clinical

## 2020-06-30 ENCOUNTER — Encounter (HOSPITAL_COMMUNITY): Payer: No Payment, Other | Admitting: Psychiatry

## 2020-07-01 ENCOUNTER — Other Ambulatory Visit: Payer: Self-pay | Admitting: Family Medicine

## 2020-07-01 DIAGNOSIS — M545 Low back pain, unspecified: Secondary | ICD-10-CM

## 2020-07-01 DIAGNOSIS — G8929 Other chronic pain: Secondary | ICD-10-CM

## 2020-07-07 ENCOUNTER — Other Ambulatory Visit: Payer: Self-pay

## 2020-07-07 ENCOUNTER — Encounter (HOSPITAL_COMMUNITY): Payer: Self-pay | Admitting: Psychiatry

## 2020-07-07 ENCOUNTER — Telehealth (INDEPENDENT_AMBULATORY_CARE_PROVIDER_SITE_OTHER): Payer: No Payment, Other | Admitting: Psychiatry

## 2020-07-07 ENCOUNTER — Other Ambulatory Visit (HOSPITAL_COMMUNITY): Payer: Self-pay | Admitting: Psychiatry

## 2020-07-07 DIAGNOSIS — F411 Generalized anxiety disorder: Secondary | ICD-10-CM | POA: Diagnosis not present

## 2020-07-07 DIAGNOSIS — F3341 Major depressive disorder, recurrent, in partial remission: Secondary | ICD-10-CM

## 2020-07-07 DIAGNOSIS — F319 Bipolar disorder, unspecified: Secondary | ICD-10-CM

## 2020-07-07 MED ORDER — MELATONIN 3 MG PO TABS: 3.0000 mg | ORAL_TABLET | Freq: Every day | ORAL | 2 refills | Status: DC

## 2020-07-07 MED ORDER — HYDROXYZINE HCL 10 MG PO TABS: 10.0000 mg | ORAL_TABLET | Freq: Three times a day (TID) | ORAL | 2 refills | Status: DC | PRN

## 2020-07-07 MED ORDER — TRAZODONE HCL 50 MG PO TABS: 50.0000 mg | ORAL_TABLET | Freq: Every day | ORAL | 2 refills | Status: DC

## 2020-07-07 MED ORDER — CITALOPRAM HYDROBROMIDE 40 MG PO TABS: 40.0000 mg | ORAL_TABLET | Freq: Every day | ORAL | 2 refills | Status: DC

## 2020-07-07 MED ORDER — CARIPRAZINE HCL 1.5 MG PO CAPS: 1.5000 mg | ORAL_CAPSULE | Freq: Every day | ORAL | 2 refills | Status: DC

## 2020-07-07 NOTE — Progress Notes (Signed)
BH MD/PA/NP OP Progress Note Virtual Visit via Telephone Note  I connected with Amanda Larson on 07/07/20 at 10:00 AM EDT by telephone and verified that I am speaking with the correct person using two identifiers.  Location: Patient: home Provider: Clinic   I discussed the limitations, risks, security and privacy concerns of performing an evaluation and management service by telephone and the availability of in person appointments. I also discussed with the patient that there may be a patient responsible charge related to this service. The patient expressed understanding and agreed to proceed.   I provided 30 minutes of non-face-to-face time during this encounter.   07/07/2020 10:43 AM Amanda Larson  MRN:  [id number]  Chief Complaint: "Can we go up on the melatonin"  HPI: 54 year old female seen today for follow up psychiatric evaluation. She has a psychiatric history of Bipolar I disorder, depression, and anxiety. She is currently managed on Vraylar 1.5mg  daily, Celexa 40 mg daily, hydroxyzine 10mg  three times daily, and melatonin 1mg  at night. She notes she has not been able to take her Vraylar because [facility] will not [facility]. She states the last time she took the Leafy KindleVraylar was a couple of weeks ago.   Today the visit was completed via phone call because patient could not login virtually. She describes her mood as good and has very little depression or anxiety.  A GAD-7 was completed today, and patient scored 10. A PHQ-9 was completed today, and patient scored 8. She states that she is sleeping about about 4-5 hours per night and eating well. She states she is starting to have difficulty falling asleep again and asking if she can increase the dose of her melatonin. She also endorses symptoms of hypo mania since stopping Vraylar such as fluctuations in mood, irritability, distractibility, racing thoughts, and impulsive spending. She notes that she is buying "everything" She  also noted that she worries about death and occasional has thoughts of self-harm but states she would never hurt herself. She denies SI/HI/AVH.   Patient is agreeable to starting Trazodone 50mg  PRN at night and increasing melatonin to 5mg  at night to help manage sleep. If Trazodone 50mg  is too sedating, patient may cut pill in half and take 25mg  at night. Potential side effects of medication and risks vs benefits of treatment vs non-treatment were explained and discussed. All questions were answered. Patient will continue taking all other medications as previously prescribed and follow up with outpatient counselor for therapy. No other concerns noted at this time.   Visit Diagnosis:    ICD-10-CM   1. Bipolar I disorder (HCC)  F31.9 traZODone (DESYREL) 50 MG tablet    cariprazine (VRAYLAR) capsule    melatonin 3 MG TABS tablet  2. Recurrent major depressive disorder, in partial remission (HCC)  F33.41 traZODone (DESYREL) 50 MG tablet    citalopram (CELEXA) 40 MG tablet    melatonin 3 MG TABS tablet  3. Generalized anxiety disorder  F41.1 citalopram (CELEXA) 40 MG tablet    hydrOXYzine (ATARAX/VISTARIL) 10 MG tablet    Past Psychiatric History: depression, anxiety, mood disorder, and PTSD.  Past Medical History:  Past Medical History:  Diagnosis Date  . Bronchitis   . Chronic back pain   . Depression   . Diabetes mellitus without complication (HCC)   . Osteoarthritis     Past Surgical History:  Procedure Laterality Date  . CESAREAN SECTION    . CHOLECYSTECTOMY    . TUBAL LIGATION  Family Psychiatric History: Daughter ADHD and Bipolar, Daughter ADHD and PTSD, Son ADHD, Grandson ADD and ADHD  Family History:  Family History  Problem Relation Age of Onset  . Cancer Father        skin  . Heart disease Paternal Grandfather   . Stroke Paternal Grandfather   . Drug abuse Paternal Grandfather   . High Cholesterol Mother   . Diabetes Maternal Aunt   . Hypertension Maternal Aunt    . Diabetes Maternal Uncle     Social History:  Social History   Socioeconomic History  . Marital status: Significant Other    Spouse name: Not on file  . Number of children: 3  . Years of education: Not on file  . Highest education level: Some college, no degree  Occupational History  . Not on file  Tobacco Use  . Smoking status: Former Smoker    Quit date: 07/31/2007    Years since quitting: 12.9  . Smokeless tobacco: Never Used  Vaping Use  . Vaping Use: Never used  Substance and Sexual Activity  . Alcohol use: Yes    Comment: 3 drinks a year   . Drug use: Yes    Types: Marijuana  . Sexual activity: Yes    Birth control/protection: Surgical  Other Topics Concern  . Not on file  Social History Narrative  . Not on file   Social Determinants of Health   Financial Resource Strain:   . Difficulty of Paying Living Expenses: Not on file  Food Insecurity:   . Worried About Programme researcher, broadcasting/film/video in the Last Year: Not on file  . Ran Out of Food in the Last Year: Not on file  Transportation Needs:   . Lack of Transportation (Medical): Not on file  . Lack of Transportation (Non-Medical): Not on file  Physical Activity:   . Days of Exercise per Week: Not on file  . Minutes of Exercise per Session: Not on file  Stress:   . Feeling of Stress : Not on file  Social Connections:   . Frequency of Communication with Friends and Family: Not on file  . Frequency of Social Gatherings with Friends and Family: Not on file  . Attends Religious Services: Not on file  . Active Member of Clubs or Organizations: Not on file  . Attends Banker Meetings: Not on file  . Marital Status: Not on file    Allergies:  Allergies  Allergen Reactions  . Sulfa Antibiotics Anaphylaxis and Swelling  . Clarithromycin Nausea Only    Metabolic Disorder Labs: Lab Results  Component Value Date   HGBA1C 7.9 (A) 03/10/2020   No results found for: PROLACTIN Lab Results  Component  Value Date   CHOL 200 (H) 07/16/2019   TRIG 231 (H) 07/16/2019   HDL 32 (L) 07/16/2019   CHOLHDL 6.3 (H) 07/16/2019   LDLCALC 127 (H) 07/16/2019   LDLCALC 106 (H) 05/14/2018   No results found for: TSH  Therapeutic Level Labs: No results found for: LITHIUM No results found for: VALPROATE No components found for:  CBMZ  Current Medications: Current Outpatient Medications  Medication Sig Dispense Refill  . metFORMIN (GLUCOPHAGE-XR) 500 MG 24 hr tablet TAKE 3 TABLETS BY MOUTH ONCE DAILY WITH BREAKFAST 90 tablet 0  . albuterol (VENTOLIN HFA) 108 (90 Base) MCG/ACT inhaler Inhale 2 puffs into the lungs every 6 (six) hours as needed for wheezing or shortness of breath. 6.7 g 4  . cariprazine (VRAYLAR) capsule Take  1 capsule (1.5 mg total) by mouth daily. 30 capsule 2  . citalopram (CELEXA) 40 MG tablet Take 1 tablet (40 mg total) by mouth daily. 30 tablet 2  . diclofenac sodium (VOLTAREN) 1 % GEL Apply 4 g topically 4 (four) times daily. 200 g 3  . Dulaglutide (TRULICITY) 1.5 MG/0.5ML SOPN Inject 0.5 mLs (1.5 mg total) into the skin once a week. 4 pen 4  . famotidine (PEPCID) 20 MG tablet Take 1 tablet (20 mg total) by mouth 2 (two) times daily. 60 tablet 2  . hydrOXYzine (ATARAX/VISTARIL) 10 MG tablet Take 1 tablet (10 mg total) by mouth 3 (three) times daily as needed for anxiety. 90 tablet 2  . melatonin 3 MG TABS tablet Take 1 tablet (3 mg total) by mouth at bedtime. 30 tablet 2  . meloxicam (MOBIC) 15 MG tablet Take 1 tablet by mouth once daily 30 tablet 0  . Omega-3 Fatty Acids (FISH OIL) 1000 MG CAPS Take by mouth.    . simvastatin (ZOCOR) 40 MG tablet Take 1 tablet (40 mg total) by mouth at bedtime. 90 tablet 3  . traZODone (DESYREL) 50 MG tablet Take 1 tablet (50 mg total) by mouth at bedtime. 30 tablet 2  . triamcinolone ointment (KENALOG) 0.5 % Apply 1 application topically 2 (two) times daily. 30 g 0   No current facility-administered medications for this visit.      Musculoskeletal: Strength & Muscle Tone: Unable to assess due to telephone visit Gait & Station: Unable to assess due to telephone visit Patient leans: N/A  Psychiatric Specialty Exam: Review of Systems  Last menstrual period 01/17/2014.There is no height or weight on file to calculate BMI.  General Appearance: Unable to assess due to telephone visit  Eye Contact:  Unable to assess due to telephone visit  Speech:  Clear and Coherent and Normal Rate  Volume:  Normal  Mood:  Anxious, Depressed and Irritable  Affect:  Appropriate and Congruent  Thought Process:  Coherent, Goal Directed and Linear  Orientation:  Full (Time, Place, and Person)  Thought Content: WDL and Logical   Suicidal Thoughts:  No  Homicidal Thoughts:  No  Memory:  Immediate;   Good Recent;   Good Remote;   Good  Judgement:  Fair  Insight:  Fair  Psychomotor Activity:  Normal  Concentration:  Concentration: Good and Attention Span: Good  Recall:  Good  Fund of Knowledge: Good  Language: Good  Akathisia:  No  Handed:  Right  AIMS (if indicated): Not done  Assets:  Communication Skills Desire for Improvement Financial Resources/Insurance Housing Social Support  ADL's:  Intact  Cognition: WNL  Sleep:  Poor   Screenings: GAD-7     Video Visit from 07/07/2020 in [facility]  Total GAD-7 Score 10    PHQ2-9     Video Visit from 07/07/2020 in [facility] Office Visit from 04/20/2020 in [facility] Office Visit from 03/10/2020 in [facility] Counselor from 03/09/2020 in [facility] Office Visit from 10/28/2019 in [facility]  PHQ-2 Total Score 2 2 2 1 2   PHQ-9 Total Score 8 8 9 3 8        Assessment and Plan: Patient endorses increased anxiety, depression, poor sleep, and hypomania. She notes that she has not taken her Vraylar in 2 weeks because she was  unable to get it filled at . Provider [facility] medications. She endorsed understanding and agreed. Patient agreeable to start Trazodone 25 mg-50 mg and increase melatonin 1 mg to 3 mg to help manage sleep. She will restart Vraylar and continue all other medications as prescribed.   1. Bipolar I disorder (HCC)  Continue- traZODone (DESYREL) 50 MG tablet; Take 1 tablet (50 mg total) by mouth at bedtime.  Dispense: 30 tablet; Refill: 2 INcreased- melatonin 3 MG TABS tablet; Take 1 tablet (3 mg total) by mouth at bedtime.  Dispense: 30 tablet; Refill: 2 Continue- cariprazine (VRAYLAR) capsule; Take 1 capsule (1.5 mg total) by mouth daily.  Dispense: 30 capsule; Refill: 2   2. Recurrent major depressive disorder, in partial remission (HCC)  Continue- citalopram (CELEXA) 40 MG tablet; Take 1 tablet (40 mg total) by mouth daily.  Dispense: 30 tablet; Refill: 2 Increased- melatonin 3 MG TABS tablet; Take 1 tablet (3 mg total) by mouth at bedtime.  Dispense: 30 tablet; Refill: 2   3. Generalized anxiety disorder  Continue- hydrOXYzine (ATARAX/VISTARIL) 10 MG tablet; Take 1 tablet (10 mg total) by mouth 3 (three) times daily as needed for anxiety.  Dispense: 90 tablet; Refill: 2   Follow up in 3 months  Follow up with therapy  Shanna Cisco, NP 07/07/2020, 10:43 AM

## 2020-08-08 ENCOUNTER — Other Ambulatory Visit: Payer: Self-pay | Admitting: Family Medicine

## 2020-08-08 DIAGNOSIS — G8929 Other chronic pain: Secondary | ICD-10-CM

## 2020-08-10 ENCOUNTER — Ambulatory Visit (HOSPITAL_COMMUNITY): Payer: No Payment, Other | Admitting: Clinical

## 2020-08-12 ENCOUNTER — Other Ambulatory Visit: Payer: Self-pay | Admitting: Family Medicine

## 2020-08-12 DIAGNOSIS — E119 Type 2 diabetes mellitus without complications: Secondary | ICD-10-CM

## 2020-08-12 DIAGNOSIS — E785 Hyperlipidemia, unspecified: Secondary | ICD-10-CM

## 2020-09-22 ENCOUNTER — Other Ambulatory Visit: Payer: Self-pay | Admitting: Family Medicine

## 2020-09-22 DIAGNOSIS — G8929 Other chronic pain: Secondary | ICD-10-CM

## 2020-09-22 DIAGNOSIS — M545 Low back pain, unspecified: Secondary | ICD-10-CM

## 2020-09-26 ENCOUNTER — Other Ambulatory Visit: Payer: Self-pay | Admitting: Family Medicine

## 2020-10-07 ENCOUNTER — Telehealth (HOSPITAL_COMMUNITY): Payer: Medicare HMO | Admitting: Psychiatry

## 2020-10-07 ENCOUNTER — Other Ambulatory Visit: Payer: Self-pay

## 2020-10-20 ENCOUNTER — Ambulatory Visit (HOSPITAL_COMMUNITY): Payer: No Payment, Other | Admitting: Clinical

## 2020-10-31 DIAGNOSIS — E119 Type 2 diabetes mellitus without complications: Secondary | ICD-10-CM | POA: Diagnosis not present

## 2020-10-31 LAB — HM DIABETES EYE EXAM

## 2020-11-03 ENCOUNTER — Other Ambulatory Visit: Payer: Self-pay | Admitting: Family Medicine

## 2020-11-03 ENCOUNTER — Other Ambulatory Visit (HOSPITAL_COMMUNITY): Payer: Self-pay | Admitting: Psychiatry

## 2020-11-03 DIAGNOSIS — F319 Bipolar disorder, unspecified: Secondary | ICD-10-CM

## 2020-11-03 DIAGNOSIS — G8929 Other chronic pain: Secondary | ICD-10-CM

## 2020-11-04 ENCOUNTER — Encounter: Payer: Self-pay | Admitting: Family Medicine

## 2020-11-04 ENCOUNTER — Ambulatory Visit (INDEPENDENT_AMBULATORY_CARE_PROVIDER_SITE_OTHER): Payer: Medicare HMO | Admitting: Family Medicine

## 2020-11-04 ENCOUNTER — Other Ambulatory Visit: Payer: Self-pay

## 2020-11-04 VITALS — BP 133/57 | HR 81 | Ht 61.0 in | Wt 328.8 lb

## 2020-11-04 DIAGNOSIS — Z1231 Encounter for screening mammogram for malignant neoplasm of breast: Secondary | ICD-10-CM | POA: Diagnosis not present

## 2020-11-04 DIAGNOSIS — G8929 Other chronic pain: Secondary | ICD-10-CM

## 2020-11-04 DIAGNOSIS — E785 Hyperlipidemia, unspecified: Secondary | ICD-10-CM | POA: Diagnosis not present

## 2020-11-04 DIAGNOSIS — Z23 Encounter for immunization: Secondary | ICD-10-CM | POA: Diagnosis not present

## 2020-11-04 DIAGNOSIS — E0800 Diabetes mellitus due to underlying condition with hyperosmolarity without nonketotic hyperglycemic-hyperosmolar coma (NKHHC): Secondary | ICD-10-CM

## 2020-11-04 DIAGNOSIS — E1165 Type 2 diabetes mellitus with hyperglycemia: Secondary | ICD-10-CM | POA: Diagnosis not present

## 2020-11-04 DIAGNOSIS — M1712 Unilateral primary osteoarthritis, left knee: Secondary | ICD-10-CM

## 2020-11-04 DIAGNOSIS — Z0000 Encounter for general adult medical examination without abnormal findings: Secondary | ICD-10-CM

## 2020-11-04 DIAGNOSIS — M5442 Lumbago with sciatica, left side: Secondary | ICD-10-CM

## 2020-11-04 DIAGNOSIS — Z1211 Encounter for screening for malignant neoplasm of colon: Secondary | ICD-10-CM

## 2020-11-04 DIAGNOSIS — Z1159 Encounter for screening for other viral diseases: Secondary | ICD-10-CM

## 2020-11-04 LAB — POCT GLYCOSYLATED HEMOGLOBIN (HGB A1C): Hemoglobin A1C: 7.9 % — AB (ref 4.0–5.6)

## 2020-11-04 MED ORDER — DICLOFENAC SODIUM 3 % EX GEL: 2.0000 g | Freq: Four times a day (QID) | CUTANEOUS | 2 refills | Status: DC

## 2020-11-04 MED ORDER — GABAPENTIN 100 MG PO CAPS
100.0000 mg | ORAL_CAPSULE | Freq: Three times a day (TID) | ORAL | 3 refills | Status: DC
Start: 2020-11-04 — End: 2021-01-19

## 2020-11-04 NOTE — Assessment & Plan Note (Addendum)
Known torn medial mensicus and bone-on-bone arthritis. Has followed up with sports medicine in the past but she is not a surgical candidate 2/2 to her weight. Encouraged healthy eating habits to foster weight loss.  Patient is on board with trying to lose weight.

## 2020-11-04 NOTE — Assessment & Plan Note (Addendum)
Uncontrolled.  Continue current regimen: 1500 XR daily and Trulicity 1.5 mg weekly.  Intermittent compliance.  A1c today 7.9 and previously 7.9. Encouraged continued diet rich in vegetables and complex carbs.  Heart healthy carb modified diet recommended. Counseled on need to continue exercising.  Likely would benefit from increase in Trulicity dose.  BMP today.   Statin therapy: Simvastatin ACEi/ARB: None Foot exam: Follow-up Urine microalbumine: Need to follow-up Eye exam: Completed at [facility] this past week. Need records release, if not sent over.

## 2020-11-04 NOTE — Progress Notes (Signed)
SUBJECTIVE:   CHIEF COMPLAINT / HPI:   Chief Complaint  Patient presents with  . Check up     Amanda Larson is a 55 y.o. female here for DM, chronic pain follow up.      Diabetes Mellitus  Occasionally misses doses of her Metformin and Trulicity.  When she misses her Trulicity it gives her bloating and occasional nausea.  Denies increased thirst, hunger, or frequent urination.  States that her grandkids have been staying at her home and they eat a lot of junk food.  She has been eating the junk food with them.  She has however stayed away from slushy's which she used to drink every day.  States about 4 days ago she began eating fruits and vegetables like she did previously that helped her lose weight.  She knows she has gained weight.   HLD Denies side effects. Takes daily though she ran out 4 days ago and recently refilled it. She is fasting this morning.   Pain  Continues to have bilateral lower back pain and medioanterior knee pain. States she was difficulty walking. She recently put on more weight. Has hx of torn medial meniscus of her left knee. Uses a cane for ambulation. Pt reports severe pain in her back and knee pain. Taking Meloxicaim it used to help but stopped helping.     PERTINENT  PMH / PSH: reviewed and updated as appropriate   OBJECTIVE:   BP (!) 133/57   Pulse 81   Ht 5\' 1"  (1.549 m)   Wt (!) 328 lb 12.8 oz (149.1 kg)   LMP 01/17/2014   SpO2 99%   BMI 62.13 kg/m    GEN: well appearing female, in no acute distress  CV: regular rate and rhythm RESP: no increased work of breathing, clear to ascultation bilaterally though difficult to auscultate  due to body habitus  ABD: obese abdomen  MSK: left knee: good ROM, TTP medial meniscus, no bony deformity, no edema, or overlying skin changes, neurovascularly intact. Back: no midline tenderness, hypertonicity of lumbar paraspinal muscles. Antalgic gait. Walking cane present.     SKIN: warm, dry NEURO:   moves all extremities appropriately PSYCH: anxious    ASSESSMENT/PLAN:   Osteoarthritis of left knee Known torn medial mensicus and bone-on-bone arthritis. Has followed up with sports medicine in the past but she is not a surgical candidate 2/2 to her weight. Encouraged healthy eating habits to foster weight loss.  Patient is on board with trying to lose weight.   Hyperlipidemia Continue simvastatin.  Lipid panel today.   Healthcare maintenance Patient behind on her healthcare maintenance that she is just recently got insurance.  Discussed colonoscopy however patient declined and would like to do Cologuard instead.  Order placed for Cologuard.  Mammogram order placed today.  Breast center handout given.  Patient received Tdap today.  She will likely benefit from Prevnar 20 when available.  Hep C ordered.  Diabetes mellitus due to underlying condition with hyperosmolarity without coma (HCC) Uncontrolled.  Continue current regimen: 1500 XR daily and Trulicity 1.5 mg weekly.  Intermittent compliance.  A1c today 7.9 and previously 7.9. Encouraged continued diet rich in vegetables and complex carbs.  Heart healthy carb modified diet recommended. Counseled on need to continue exercising.  Likely would benefit from increase in Trulicity dose.  BMP today.   Statin therapy: Simvastatin ACEi/ARB: None Foot exam: Follow-up Urine microalbumine: Need to follow-up Eye exam: Completed at 03/19/2014 eye care this past week.  Need records release, if not sent over.    Chronic low back pain Discontinue meloxicam.  Start Voltaren gel 4 times daily, Tylenol 1000 g 3 times daily, gabapentin 300 mg 3 times daily.       Katha Cabal, DO PGY-2, [facility] 11/04/2020

## 2020-11-04 NOTE — Assessment & Plan Note (Addendum)
Patient behind on her healthcare maintenance that she is just recently got insurance.  Discussed colonoscopy however patient declined and would like to do Cologuard instead.  Order placed for Cologuard.  Mammogram order placed today.  Breast center handout given.  Patient received Tdap today.  She will likely benefit from Prevnar 20 when available.  Hep C ordered.

## 2020-11-04 NOTE — Assessment & Plan Note (Signed)
Discontinue meloxicam.  Start Voltaren gel 4 times daily, Tylenol 1000 g 3 times daily, gabapentin 300 mg 3 times daily.

## 2020-11-04 NOTE — Patient Instructions (Signed)
It was great seeing you today!  Please check-out at the front desk before leaving the clinic. I'd like to see you back in 3 months but if you need to be seen earlier than that for any new issues we're happy to fit you in, just give [place] a call!  Visit Remembers: - Stop by the pharmacy to pick up your prescriptions  - Continue to work on your healthy eating habits and incorporating exercise into your daily life.  - Your goal is to have an BP <120/80  - Your goal is to have an a1c <7.   For pain: Start Gabapentin three times a day, use voltaren gel 4 times daily and Tylenol 1000 mg 3 times daily. Continue working on [employer] as this will impact your pain as well.    Regarding lab work today:  Due to recent changes in healthcare laws, you may see the results of your imaging and laboratory studies on MyChart before your provider has had a chance to review them.  I understand that in some cases there may be results that are confusing or concerning to you. Not all laboratory results come back in the same time frame and you may be waiting for multiple results in order to interpret others.  Please give [place] 72 hours in order for your provider to thoroughly review all the results before contacting the office for clarification of your results. If everything is normal, you will get a letter in the mail or a message in My Chart. Please give [place] a call if you do not hear from [place] after 2 weeks.  Please bring all of your medications with you to each visit.    If you haven't already, sign up for My Chart to have easy access to your labs results, and communication with your primary care physician.  Feel free to call with any questions or concerns at any time, at [phone].   Take care,  Dr. Katherina [facility]    Diet Recommendations for Diabetes  Carbohydrate includes starch, sugar, and fiber.  Of these, only sugar and starch raise blood glucose.  (Fiber is found in fruits,  vegetables [especially skin, seeds, and stalks] and whole grains.)   Starchy (carb) foods: Bread, rice, pasta, potatoes, corn, cereal, grits, crackers, bagels, muffins, all baked goods.  (Fruit, milk, and yogurt also have carbohydrate, but most of these foods will not spike your blood sugar as most starchy foods will.)  A few fruits do cause high blood sugars; use small portions of bananas (limit to 1/2 at a time), grapes, watermelon, oranges, and most tropical fruits.   Protein foods: Meat, fish, poultry, eggs, dairy foods, and beans such as pinto and kidney beans (beans also provide carbohydrate).   1. Eat at least REAL 3 meals and 1-2 snacks per day. Never go more than 4-5 hours while awake without eating. Eat breakfast within the first hour of getting up.   2. Limit starchy foods to TWO per meal and ONE per snack. ONE portion of a starchy food is equal to the following:   - ONE slice of bread (or its equivalent, such as half of a hamburger bun).   - 1/2 cup of a "scoopable" starchy food such as potatoes or rice.   - 15 grams of Total Carbohydrate as shown on food label.   - Every 4 ounces of a sweet drink (including fruit juice). 3. Include at every meal: a protein food, a carb food,  and vegetables and/or fruit.   - Obtain twice the volume of veg's as protein or carbohydrate foods for both lunch and dinner.   - Fresh or frozen veg's are best.   - Keep frozen veg's on hand for a quick vegetable serving.

## 2020-11-04 NOTE — Assessment & Plan Note (Addendum)
Continue simvastatin.  Lipid panel today.

## 2020-11-05 LAB — HEPATITIS C ANTIBODY: Hep C Virus Ab: <0.1 s/co ratio (ref 0.0–0.9)

## 2020-11-05 LAB — LIPID PANEL
Chol/HDL Ratio: 5.9 ratio — ABNORMAL HIGH (ref 0.0–4.4)
Cholesterol, Total: 205 mg/dL — ABNORMAL HIGH (ref 100–199)
HDL: 35 mg/dL — ABNORMAL LOW (ref >39)
LDL Chol Calc (NIH): 131 mg/dL — ABNORMAL HIGH (ref 0–99)
Triglycerides: 218 mg/dL — ABNORMAL HIGH (ref 0–149)
VLDL Cholesterol Cal: 39 mg/dL (ref 5–40)

## 2020-11-05 LAB — BASIC METABOLIC PANEL
BUN/Creatinine Ratio: 25 — ABNORMAL HIGH (ref 9–23)
BUN: 17 mg/dL (ref 6–24)
CO2: 23 mmol/L (ref 20–29)
Calcium: 10.3 mg/dL — ABNORMAL HIGH (ref 8.7–10.2)
Chloride: 98 mmol/L (ref 96–106)
Creatinine, Ser: 0.67 mg/dL (ref 0.57–1.00)
GFR calc Af Amer: 114 mL/min/{1.73_m2} (ref >59)
GFR calc non Af Amer: 99 mL/min/{1.73_m2} (ref >59)
Glucose: 179 mg/dL — ABNORMAL HIGH (ref 65–99)
Potassium: 4.6 mmol/L (ref 3.5–5.2)
Sodium: 137 mmol/L (ref 134–144)

## 2020-11-14 ENCOUNTER — Encounter (HOSPITAL_COMMUNITY): Payer: Self-pay | Admitting: Psychiatry

## 2020-11-14 ENCOUNTER — Other Ambulatory Visit: Payer: Self-pay

## 2020-11-14 ENCOUNTER — Other Ambulatory Visit (HOSPITAL_COMMUNITY): Payer: Self-pay | Admitting: Psychiatry

## 2020-11-14 ENCOUNTER — Ambulatory Visit (INDEPENDENT_AMBULATORY_CARE_PROVIDER_SITE_OTHER): Payer: No Payment, Other | Admitting: Psychiatry

## 2020-11-14 DIAGNOSIS — F3341 Major depressive disorder, recurrent, in partial remission: Secondary | ICD-10-CM | POA: Diagnosis not present

## 2020-11-14 DIAGNOSIS — F411 Generalized anxiety disorder: Secondary | ICD-10-CM | POA: Diagnosis not present

## 2020-11-14 DIAGNOSIS — F319 Bipolar disorder, unspecified: Secondary | ICD-10-CM | POA: Diagnosis not present

## 2020-11-14 MED ORDER — MELATONIN 5 MG PO TABS: 5.0000 mg | ORAL_TABLET | Freq: Every day | ORAL | 2 refills | Status: DC

## 2020-11-14 MED ORDER — CITALOPRAM HYDROBROMIDE 40 MG PO TABS: 40.0000 mg | ORAL_TABLET | Freq: Every day | ORAL | 2 refills | Status: DC

## 2020-11-14 MED ORDER — HYDROXYZINE HCL 25 MG PO TABS: 25.0000 mg | ORAL_TABLET | Freq: Three times a day (TID) | ORAL | 2 refills | Status: DC | PRN

## 2020-11-14 MED ORDER — TRAZODONE HCL 50 MG PO TABS: 50.0000 mg | ORAL_TABLET | Freq: Every day | ORAL | 2 refills | Status: DC

## 2020-11-14 MED ORDER — CARIPRAZINE HCL 1.5 MG PO CAPS
ORAL_CAPSULE | ORAL | 2 refills | Status: DC
Start: 2020-11-14 — End: 2020-11-14

## 2020-11-14 NOTE — Progress Notes (Signed)
BH MD/PA/NP OP Progress Note   11/14/2020 11:38 AM Amanda Larson  MRN:  [id number]  Chief Complaint:  Chief Complaint    Medication Management    "Things are better but I get nervous when I drive"  HPI: 55 year old female seen today for follow up psychiatric evaluation. She has a psychiatric history of Bipolar I disorder, depression, and anxiety. She is currently managed on Vraylar 1.5mg  daily, Celexa 40 mg daily, hydroxyzine 10mg  three times daily, and melatonin 5mg  at night. She notes that her medications are somewhat effective in managing her psychiatric conditions.    Today she is well groomed, pleasant, cooperative, engaged in conversation, and maintained eye contact. Toady she notes her anxiety has not improved but reports that her depression has improved. Today provider conducted a GAD 7 and patient scored a 10, at her last visit she scored a 10. She notes she is anxious when driving and notes that she has been thinking more about her health and dying. She denies SI/HI/VAH, mania, or paranoia. Provider also conducted a PHQ 9 and patient scored an 8, at her last visit she scored an 8. She notes her sleep has improved noting that she sleeps 8 hour nighty. She also endorses increased appetite and weight (noting that she has gained 5 pounds).   She is agreeable to increasing hydroxyzine 10mg  to 25 mg to help manage anxiety.  She will continue all other medications as prescribed.  She will follow up with outpatient counseling for therapy.  No other concerns at this time.  Visit Diagnosis:    ICD-10-CM   1. Recurrent major depressive disorder, in partial remission (HCC)  F33.41 citalopram (CELEXA) 40 MG tablet    melatonin 5 MG TABS    traZODone (DESYREL) 50 MG tablet  2. Generalized anxiety disorder  F41.1 citalopram (CELEXA) 40 MG tablet    hydrOXYzine (ATARAX/VISTARIL) 25 MG tablet  3. Bipolar I disorder (HCC)  F31.9 melatonin 5 MG TABS    traZODone (DESYREL) 50 MG tablet     cariprazine (VRAYLAR) capsule    Past Psychiatric History: depression, anxiety, mood disorder, and PTSD.  Past Medical History:  Past Medical History:  Diagnosis Date  . Bronchitis   . Chronic back pain   . Depression   . Diabetes mellitus without complication (HCC)   . Osteoarthritis     Past Surgical History:  Procedure Laterality Date  . CESAREAN SECTION    . CHOLECYSTECTOMY    . TUBAL LIGATION      Family Psychiatric History: Daughter ADHD and Bipolar, Daughter ADHD and PTSD, Son ADHD, Grandson ADD and ADHD  Family History:  Family History  Problem Relation Age of Onset  . Cancer Father        skin  . Heart disease Paternal Grandfather   . Stroke Paternal Grandfather   . Drug abuse Paternal Grandfather   . High Cholesterol Mother   . Diabetes Maternal Aunt   . Hypertension Maternal Aunt   . Diabetes Maternal Uncle     Social History:  Social History   Socioeconomic History  . Marital status: Significant Other    Spouse name: Not on file  . Number of children: 3  . Years of education: Not on file  . Highest education level: Some college, no degree  Occupational History  . Not on file  Tobacco Use  . Smoking status: Former Smoker    Quit date: 07/31/2007    Years since quitting: 13.3  . Smokeless tobacco: Never Used  Vaping Use  . Vaping Use: Never used  Substance and Sexual Activity  . Alcohol use: Yes    Comment: 3 drinks a year   . Drug use: Yes    Types: Marijuana  . Sexual activity: Yes    Birth control/protection: Surgical  Other Topics Concern  . Not on file  Social History Narrative  . Not on file   Social Determinants of Health   Financial Resource Strain: Not on file  Food Insecurity: Not on file  Transportation Needs: Not on file  Physical Activity: Not on file  Stress: Not on file  Social Connections: Not on file    Allergies:  Allergies  Allergen Reactions  . Sulfa Antibiotics Anaphylaxis and Swelling  . Clarithromycin  Nausea Only    Metabolic Disorder Labs: Lab Results  Component Value Date   HGBA1C 7.9 (A) 11/04/2020   No results found for: PROLACTIN Lab Results  Component Value Date   CHOL 205 (H) 11/04/2020   TRIG 218 (H) 11/04/2020   HDL 35 (L) 11/04/2020   CHOLHDL 5.9 (H) 11/04/2020   LDLCALC 131 (H) 11/04/2020   LDLCALC 127 (H) 07/16/2019   No results found for: TSH  Therapeutic Level Labs: No results found for: LITHIUM No results found for: VALPROATE No components found for:  CBMZ  Current Medications: Current Outpatient Medications  Medication Sig Dispense Refill  . metFORMIN (GLUCOPHAGE-XR) 500 MG 24 hr tablet TAKE 3 TABLETS BY MOUTH ONCE DAILY WITH BREAKFAST 90 tablet 2  . albuterol (VENTOLIN HFA) 108 (90 Base) MCG/ACT inhaler Inhale 2 puffs into the lungs every 6 (six) hours as needed for wheezing or shortness of breath. 6.7 g 4  . cariprazine (VRAYLAR) capsule TAKE 1 CAPSULE (1.5 MG TOTAL) BY MOUTH DAILY. 30 capsule 2  . citalopram (CELEXA) 40 MG tablet Take 1 tablet (40 mg total) by mouth daily. 30 tablet 2  . Diclofenac Sodium 3 % GEL Apply 2 g topically in the morning, at noon, in the evening, and at bedtime. 100 g 2  . Dulaglutide (TRULICITY) 1.5 MG/0.5ML SOPN Inject 0.5 mLs (1.5 mg total) into the skin once a week. 4 pen 4  . famotidine (PEPCID) 20 MG tablet Take 1 tablet (20 mg total) by mouth 2 (two) times daily. 60 tablet 2  . gabapentin (NEURONTIN) 100 MG capsule Take 1 capsule (100 mg total) by mouth 3 (three) times daily. 90 capsule 3  . hydrOXYzine (ATARAX/VISTARIL) 25 MG tablet Take 1 tablet (25 mg total) by mouth 3 (three) times daily as needed for anxiety. 90 tablet 2  . melatonin 5 MG TABS Take 1 tablet (5 mg total) by mouth at bedtime. 30 tablet 2  . meloxicam (MOBIC) 15 MG tablet Take 1 tablet by mouth once daily 30 tablet 3  . Omega-3 Fatty Acids (FISH OIL) 1000 MG CAPS Take by mouth.    . simvastatin (ZOCOR) 40 MG tablet TAKE 1 TABLET BY MOUTH AT BEDTIME 30  tablet 6  . traZODone (DESYREL) 50 MG tablet Take 1 tablet (50 mg total) by mouth at bedtime. 30 tablet 2  . triamcinolone ointment (KENALOG) 0.5 % Apply 1 application topically 2 (two) times daily. 30 g 0   No current facility-administered medications for this visit.     Musculoskeletal: Strength & Muscle Tone: within normal limits Gait & Station: normal Patient leans: N/A  Psychiatric Specialty Exam: Review of Systems  Blood pressure 117/74, pulse 86, height 5\' 1"  (1.549 m), weight (!) 327 lb (148.3 kg),  last menstrual period 01/17/2014, SpO2 94 %.Body mass index is 61.79 kg/m.  General Appearance: Well Groomed  Eye Contact:  Good  Speech:  Clear and Coherent and Normal Rate  Volume:  Normal  Mood:  Anxious and Depressed  Affect:  Appropriate and Congruent  Thought Process:  Coherent, Goal Directed and Linear  Orientation:  Full (Time, Place, and Person)  Thought Content: WDL and Logical   Suicidal Thoughts:  No  Homicidal Thoughts:  No  Memory:  Immediate;   Good Recent;   Good Remote;   Good  Judgement:  Good  Insight:  Good  Psychomotor Activity:  Normal  Concentration:  Concentration: Good and Attention Span: Good  Recall:  Good  Fund of Knowledge: Good  Language: Good  Akathisia:  No  Handed:  Right  AIMS (if indicated): Not done  Assets:  Communication Skills Desire for Improvement Financial Resources/Insurance Housing Social Support  ADL's:  Intact  Cognition: WNL  Sleep:  Good   Screenings: GAD-7   Flowsheet Row Clinical Support from 11/14/2020 in [facility] Video Visit from 07/07/2020 in [facility]  Total GAD-7 Score 10 10    PHQ2-9   Flowsheet Row Clinical Support from 11/14/2020 in [facility] Office Visit from 11/04/2020 in [facility] Video Visit from 07/07/2020 in [facility] Office Visit from 04/20/2020 in [facility] Office Visit from 03/10/2020 in [facility]  PHQ-2 Total Score 2 2 2 2 2   PHQ-9 Total Score 8 9 8 8 9     Flowsheet Row Clinical Support from 11/14/2020 in [facility]  C-SSRS RISK CATEGORY No Risk       Assessment and Plan: Patient endorses increased anxiety however notes that her depression has somewhat improved.  Today she is agreeable to increasing hydroxyzine 10 mg to 25 mg to help manage anxiety.  She will continue her other medications prescribed.  1. Bipolar I disorder (HCC)  Continue- traZODone (DESYREL) 50 MG tablet; Take 1 tablet (50 mg total) by mouth at bedtime.  Dispense: 30 tablet; Refill: 2 Continue- melatonin 5 MG TABS tablet; Take 1 tablet (5 mg total) by mouth at bedtime.  Dispense: 30 tablet; Refill: 2 Continue- cariprazine (VRAYLAR) capsule; Take 1 capsule (1.5 mg total) by mouth daily.  Dispense: 30 capsule; Refill: 2   2. Recurrent major depressive disorder, in partial remission (HCC)  Continue- citalopram (CELEXA) 40 MG tablet; Take 1 tablet (40 mg total) by mouth daily.  Dispense: 30 tablet; Refill: 2 Continue melatonin 5 MG TABS tablet; Take 1 tablet (5 mg total) by mouth at bedtime.  Dispense: 30 tablet; Refill: 2   3. Generalized anxiety disorder  Increased hydrOXYzine (ATARAX/VISTARIL) 25 MG tablet; Take 1 tablet (25mg  total) by mouth 3 (three) times daily as needed for anxiety.  Dispense: 90 tablet; Refill: 2   Follow up in 3 months  Follow up with therapy  01/14/2021, NP 11/14/2020, 11:38 AM

## 2020-11-22 DIAGNOSIS — Z1212 Encounter for screening for malignant neoplasm of rectum: Secondary | ICD-10-CM | POA: Diagnosis not present

## 2020-11-22 DIAGNOSIS — Z1211 Encounter for screening for malignant neoplasm of colon: Secondary | ICD-10-CM | POA: Diagnosis not present

## 2020-11-22 LAB — COLOGUARD: Cologuard: NEGATIVE

## 2020-11-23 ENCOUNTER — Ambulatory Visit (INDEPENDENT_AMBULATORY_CARE_PROVIDER_SITE_OTHER): Payer: No Payment, Other | Admitting: Clinical

## 2020-11-23 ENCOUNTER — Other Ambulatory Visit: Payer: Self-pay

## 2020-11-23 DIAGNOSIS — F3341 Major depressive disorder, recurrent, in partial remission: Secondary | ICD-10-CM

## 2020-11-23 NOTE — Progress Notes (Signed)
   THERAPIST PROGRESS NOTE   Session Time: 30 minutes  Participation Level: Active  Behavioral Response: CasualAlertDepressed  Type of Therapy: Individual Therapy  Treatment Goals addressed: Anxiety  Interventions: CBT  Summary:  Amanda Larson is a 55 y.o. female who presents for the scheduled session oriented times five, appropriately dressed, and friendly. Client denied hallucinations and delusions. Client reported on today she has been maintaining fairly well but has been struggling with anxiety. Client reported over the past month and has had anxiety provoking thoughts about fearing death. Client reported it is not related to wanting to hurt herself but just thinking about what would happen to her family when she passes. Client reported she is not sure where it is coming from exactly. Client reported that her 30 year old grandson has brain cancer and she is constantly in pain from her legs, back and neck that those things may attribute to how she feels. Client reported she does get depressed when she can't do things she wants to do because of her physical capabilities. Client reported she stays active by taking her daughter and grand kids to their appointments and helping a neighbor get around town. Client reported she tries to stay active or doing something outside the house as she feels able because being in doors her mind wonders about depressive things and worries.    Suicidal/Homicidal: Nowithout intent/plan  Therapist Response:  Therapist began the session checking in and asking the client how she has been doing since last seen. Therapist actively listened to the clients thoughts and feelings. Therapist asked open ended questions and engaged with the client to identify pattern of thinking that provokes depressive and anxiety thoughts and behaviors. Therapist used CBT to discuss mindfulness to bring her attention to the present and not predicting negative things that may or  may not happen as homework. Client was scheduled for next appointment.    Plan: Return again in 4 weeks for individual therapy.  Diagnosis: Recurrent major depressive disorder, in partial remission  Amanda Rhymes Saveah Bahar, LCSW 11/23/2020

## 2020-11-27 LAB — COLOGUARD: COLOGUARD: NEGATIVE

## 2020-12-05 ENCOUNTER — Other Ambulatory Visit: Payer: Self-pay | Admitting: Family Medicine

## 2020-12-05 DIAGNOSIS — L409 Psoriasis, unspecified: Secondary | ICD-10-CM

## 2020-12-05 MED ORDER — TRIAMCINOLONE ACETONIDE 0.5 % EX OINT: 1.0000 "application " | TOPICAL_OINTMENT | Freq: Two times a day (BID) | CUTANEOUS | 0 refills | Status: DC

## 2020-12-10 ENCOUNTER — Other Ambulatory Visit: Payer: Self-pay

## 2020-12-10 MED FILL — Cariprazine HCl Cap 1.5 MG (Base Equivalent): ORAL | 30 days supply | Qty: 30 | Fill #0 | Status: AC

## 2020-12-11 ENCOUNTER — Other Ambulatory Visit: Payer: Self-pay

## 2020-12-11 MED FILL — Trazodone HCl Tab 50 MG: ORAL | 30 days supply | Qty: 30 | Fill #0 | Status: AC

## 2020-12-12 ENCOUNTER — Ambulatory Visit (HOSPITAL_COMMUNITY): Payer: No Payment, Other | Admitting: Clinical

## 2020-12-12 ENCOUNTER — Other Ambulatory Visit: Payer: Self-pay

## 2020-12-16 ENCOUNTER — Other Ambulatory Visit: Payer: Self-pay

## 2021-01-10 ENCOUNTER — Ambulatory Visit (INDEPENDENT_AMBULATORY_CARE_PROVIDER_SITE_OTHER): Payer: Medicare HMO | Admitting: Clinical

## 2021-01-10 DIAGNOSIS — F3341 Major depressive disorder, recurrent, in partial remission: Secondary | ICD-10-CM | POA: Diagnosis not present

## 2021-01-10 DIAGNOSIS — R69 Illness, unspecified: Secondary | ICD-10-CM | POA: Diagnosis not present

## 2021-01-12 ENCOUNTER — Other Ambulatory Visit: Payer: Self-pay

## 2021-01-12 ENCOUNTER — Other Ambulatory Visit: Payer: Self-pay | Admitting: Family Medicine

## 2021-01-12 ENCOUNTER — Ambulatory Visit
Admission: RE | Admit: 2021-01-12 | Discharge: 2021-01-12 | Disposition: A | Discharge status: Home / Self Care | Payer: Medicare HMO | Source: Ambulatory Visit | Attending: Family Medicine | Admitting: Family Medicine

## 2021-01-12 DIAGNOSIS — Z1231 Encounter for screening mammogram for malignant neoplasm of breast: Secondary | ICD-10-CM | POA: Diagnosis not present

## 2021-01-13 ENCOUNTER — Other Ambulatory Visit: Payer: Self-pay

## 2021-01-13 ENCOUNTER — Other Ambulatory Visit: Payer: Self-pay | Admitting: Family Medicine

## 2021-01-13 MED FILL — Cariprazine HCl Cap 1.5 MG (Base Equivalent): ORAL | 30 days supply | Qty: 30 | Fill #1 | Status: CN

## 2021-01-13 MED FILL — Cariprazine HCl Cap 1.5 MG (Base Equivalent): ORAL | 30 days supply | Qty: 30 | Fill #1 | Status: AC

## 2021-01-13 MED FILL — Trazodone HCl Tab 50 MG: ORAL | 30 days supply | Qty: 30 | Fill #1 | Status: AC

## 2021-01-19 ENCOUNTER — Encounter: Payer: Self-pay | Admitting: Family Medicine

## 2021-01-19 ENCOUNTER — Ambulatory Visit (INDEPENDENT_AMBULATORY_CARE_PROVIDER_SITE_OTHER): Payer: Medicare HMO | Admitting: Family Medicine

## 2021-01-19 ENCOUNTER — Other Ambulatory Visit: Payer: Self-pay

## 2021-01-19 VITALS — BP 132/80 | HR 82 | Ht 61.0 in | Wt 334.0 lb

## 2021-01-19 DIAGNOSIS — G8929 Other chronic pain: Secondary | ICD-10-CM

## 2021-01-19 DIAGNOSIS — E11628 Type 2 diabetes mellitus with other skin complications: Secondary | ICD-10-CM | POA: Diagnosis not present

## 2021-01-19 DIAGNOSIS — Z0000 Encounter for general adult medical examination without abnormal findings: Secondary | ICD-10-CM | POA: Diagnosis not present

## 2021-01-19 DIAGNOSIS — M1712 Unilateral primary osteoarthritis, left knee: Secondary | ICD-10-CM | POA: Diagnosis not present

## 2021-01-19 DIAGNOSIS — E0800 Diabetes mellitus due to underlying condition with hyperosmolarity without nonketotic hyperglycemic-hyperosmolar coma (NKHHC): Secondary | ICD-10-CM

## 2021-01-19 DIAGNOSIS — M5442 Lumbago with sciatica, left side: Secondary | ICD-10-CM | POA: Diagnosis not present

## 2021-01-19 LAB — POCT GLYCOSYLATED HEMOGLOBIN (HGB A1C): HbA1c, POC (controlled diabetic range): 8.6 % — AB (ref 0.0–7.0)

## 2021-01-19 MED ORDER — GABAPENTIN 300 MG PO CAPS: 300.0000 mg | ORAL_CAPSULE | Freq: Three times a day (TID) | ORAL | 2 refills | Status: DC

## 2021-01-19 MED ORDER — TRULICITY 3 MG/0.5ML ~~LOC~~ SOAJ: 3.0000 mg | SUBCUTANEOUS | 3 refills | Status: DC

## 2021-01-19 NOTE — Progress Notes (Signed)
   SUBJECTIVE:   CHIEF COMPLAINT / HPI:    Amanda Larson is a 55 y.o. female here for diabetes follow up and left extremity pain.   Diabetes Mellitus  Reports she has not been watching what she eats. Has been enjoying carb dense foods daily. Denies missing any doses of DM medications and takes Trulicity (Mondays) and metformin. Denies increased thirst, hunger, or frequent urination.    Extremity Pain  Left extremity pain present for the past 6 months. Taking Mobic and Gabapentin for pain with some relief. Has no new LE swelling. Has chronic left knee pain and swelling. History of meniscal tear. Not able to walk her dog or go shopping due to pain. Denies falls, trauma, numbness. Uses a cane for ambulation. Endorses popping and radiating pain when she stands up sometimes.    PERTINENT  PMH / PSH: reviewed and updated as appropriate   OBJECTIVE:   BP 132/80   Pulse 82   Ht 5\' 1"  (1.549 m)   Wt (!) 334 lb (151.5 kg)   LMP 01/17/2014   SpO2 96%   BMI 63.11 kg/m    GEN: well appearing obese female, in no acute distress  CV: regular rate and rhythm, no murmurs appreciated  RESP: no increased work of breathing, clear to ascultation bilaterally  MSK: no LE edema, or calf tenderness, left knee: limited extension due to pain, good abduction, adduction, flexion, no bony or joint line tenderness, no greater trochanteric tenderness  SKIN: warm, dry, psoriatic skin plaques on extremities and neck    ASSESSMENT/PLAN:   Uncontrolled diabetes mellitus with hyperglycemia (HCC) Uncontrolled. Discussed healthy dietary habits. Pt motivated to implement lifestyle changes.  She has gained 6 lbs since Feb visit. A1c today 8.6 from 7.9 previously. Increase Trulicity to 3 mg weekly.  Pt recently got insurance but continues to have trouble paying for her medication. Pharmacy liaison, Mar aware and updated pt's Rx with [employer]. Continue 1500 mg ER metformin. Exercise as tolerated. Discussed  increasing to high intensity statin but patient declined due to cost.    Statin therapy: Simvastatin.  ACEi/ARB: None Foot exam: Completed today (01/20/21) Urine microalbumine:  Completed today (01/20/21) [facility]: [facility], records release obtained  PNA vaccine: discuss at follow up    Healthcare maintenance Reports she has scheduled colonoscopy later this month.  Chronic low back pain Suspect lumbar radiculopathy with chronic left knee pain from known meniscal injury and arthritis. Possibily greater trochanteric syndrome. Consider PT if not improving. Encouraged weight loss and healthy lifestyle modifications. Body mass index is 63.11 kg/m. Continue Meloxicam and tylenol. Discussed restarting diclofenac gel and lidocaine patches for pain. Increase Gabapentin 300 mg TID PRN.      [place], DO PGY-2, [facility] 01/20/2021

## 2021-01-19 NOTE — Patient Instructions (Signed)
It was great seeing you today!  Please check-out at the front desk before leaving the clinic. I'd like to see you back in 3 months but if you need to be seen earlier than that for any new issues we're happy to fit you in, just give [place] a call!  Visit Remembers: - Stop by the pharmacy to pick up your prescriptions - Continue to work on your healthy eating habits and incorporating exercise into your daily life. (see below) - Your goal is to have an A1c < 7 - Medicine Changes:    ** For leg pain Increase Gabapentin to 300 mg three times a day as needed. Continue taking Mobic.    Diet Recommendations for Diabetes  Carbohydrate includes starch, sugar, and fiber.  Of these, only sugar and starch raise blood glucose.  (Fiber is found in fruits, vegetables [especially skin, seeds, and stalks] and whole grains.)   Starchy (carb) foods: Bread, rice, pasta, potatoes, corn, cereal, grits, crackers, bagels, muffins, all baked goods.  (Fruit, milk, and yogurt also have carbohydrate, but most of these foods will not spike your blood sugar as most starchy foods will.)  A few fruits do cause high blood sugars; use small portions of bananas (limit to 1/2 at a time), grapes, watermelon, oranges, and most tropical fruits.   Protein foods: Meat, fish, poultry, eggs, dairy foods, and beans such as pinto and kidney beans (beans also provide carbohydrate).   1. Eat at least REAL 3 meals and 1-2 snacks per day. Never go more than 4-5 hours while awake without eating. Eat breakfast within the first hour of getting up.   2. Limit starchy foods to TWO per meal and ONE per snack. ONE portion of a starchy food is equal to the following:   - ONE slice of bread (or its equivalent, such as half of a hamburger bun).   - 1/2 cup of a "scoopable" starchy food such as potatoes or rice.   - 15 grams of Total Carbohydrate as shown on food label.   - Every 4 ounces of a sweet drink (including fruit juice). 3. Include at every meal:  a protein food, a carb food, and vegetables and/or fruit.   - Obtain twice the volume of veg's as protein or carbohydrate foods for both lunch and dinner.   - Fresh or frozen veg's are best.   - Keep frozen veg's on hand for a quick vegetable serving.       Regarding lab work today:  Due to recent changes in healthcare laws, you may see the results of your imaging and laboratory studies on MyChart before your doctor has had a chance to review them.  We understand that in some cases there may be results that are confusing or concerning to you. Not all laboratory results come back in the same time frame and your doctor may be waiting for multiple results in order to interpret others.  Please give [place] 72 hours in order for your doctor to thoroughly review all the results before contacting the office for clarification of your results. If everything is normal, you will get a letter in the mail or a message in My Chart. Please give [place] a call if you do not hear from [place] after 2 weeks.  Please bring all of your medications with you to each visit.    If you haven't already, sign up for My Chart to have easy access to your labs results, and communication with your primary care  physician.  Feel free to call with any questions or concerns at any time, at [phone].   Take care,  Dr. Katherina [facility]

## 2021-01-20 LAB — MICROALBUMIN / CREATININE URINE RATIO
Creatinine, Urine: 118.3 mg/dL
Microalb/Creat Ratio: 14 mg/g creat (ref 0–29)
Microalbumin, Urine: 16.7 ug/mL

## 2021-01-20 NOTE — Assessment & Plan Note (Signed)
Reports she has scheduled colonoscopy later this month.

## 2021-01-20 NOTE — Assessment & Plan Note (Signed)
Suspect lumbar radiculopathy with chronic left knee pain from known meniscal injury and arthritis. Possibily greater trochanteric syndrome. Consider PT if not improving. Encouraged weight loss and healthy lifestyle modifications. Body mass index is 63.11 kg/m. Continue Meloxicam and tylenol. Discussed restarting diclofenac gel and lidocaine patches for pain. Increase Gabapentin 300 mg TID PRN.

## 2021-01-20 NOTE — Assessment & Plan Note (Addendum)
Uncontrolled. Discussed healthy dietary habits. Pt motivated to implement lifestyle changes.  She has gained 6 lbs since Feb visit. A1c today 8.6 from 7.9 previously. Increase Trulicity to 3 mg weekly.  Pt recently got insurance but continues to have trouble paying for her medication. Pharmacy liaison, Durward Mallard aware and updated pt's Rx with [employer]. Continue 1500 mg ER metformin. Exercise as tolerated. Discussed increasing to high intensity statin but patient declined due to cost.    Statin therapy: Simvastatin.  ACEi/ARB: None Foot exam: Completed today (01/20/21) Urine microalbumine:  Completed today (01/20/21) Eye exam: completed at [facility], records release obtained  PNA vaccine: discuss at follow up

## 2021-01-22 NOTE — Progress Notes (Signed)
   THERAPIST PROGRESS NOTE Virtual Visit via Video Note  I connected with [name] on 01/10/2021 at 11:00 AM EDT by a video enabled telemedicine application and verified that I am speaking with the correct person using two identifiers.  Location: Patient: home Provider: office   I discussed the limitations of evaluation and management by telemedicine and the availability of in person appointments. The patient expressed understanding and agreed to proceed.   Follow Up Instructions: I discussed the assessment and treatment plan with the patient. The patient was provided an opportunity to ask questions and all were answered. The patient agreed with the plan and demonstrated an understanding of the instructions.   The patient was advised to call back or seek an in-person evaluation if the symptoms worsen or if the condition fails to improve as anticipated.   Session Time: 20 minutes  Participation Level: Active  Behavioral Response: CasualAlertDepressed  Type of Therapy: Individual Therapy  Treatment Goals addressed: Diagnosis: depression  Interventions: CBT  Summary:  Amanda Larson is a 55 y.o. female who presents for the scheduled session oriented times five and cooperative. Client denied hallucinations and delusions. Client reported on today she has been feeling down. Client reported she has some kind of cold so it has taken her energy but emotionally and mentally she is maintaining well. Client reported her depressed mood pertains to situational cause of not being able to go out or enjoy her yard because she is sick. Client reported her family is doing well and everyone is getting along. Client reported before getting sick she has been keeping herself active and taking her neighbor to the store. Client reported she is doing well without no complaint today.    Suicidal/Homicidal: Nowithout intent/plan  Therapist Response:  Therapist began the session asking how she  has been doing since last seen. Therapist actively listened and used positive emotional support. Therapist sued CBT to normalize the clients depressed feelings. Therapist assigned the client to keep her windows open for sunlight while she is in the house recovering. Therapist scheduled the client for next appointment.   Plan: Return again in 5 weeks.  Diagnosis: Recurrent major depressive disorder, in partial remission   Amanda Rhymes Tion Tse, LCSW 01/10/2021

## 2021-01-24 NOTE — Progress Notes (Signed)
Submitted application for TRULICITY 3MG/0.5ML to [employer] for patient assistance.   Phone: [phone]  

## 2021-02-03 NOTE — Progress Notes (Signed)
Received notification from [facility] [employer] regarding approval for TRULICITY 3MG /0.5ML. Patient assistance approved from 01/24/21 to 09/09/21.  4 MONTH SUPPLY OF MEDICATION SHIPPED TO [facility] 02/01/21 AND WILL AUTO REFILL.   Phone: [phone]

## 2021-02-08 ENCOUNTER — Encounter (HOSPITAL_COMMUNITY): Payer: No Payment, Other | Admitting: Psychiatry

## 2021-02-08 ENCOUNTER — Other Ambulatory Visit: Payer: Self-pay

## 2021-02-08 MED FILL — Cariprazine HCl Cap 1.5 MG (Base Equivalent): ORAL | 30 days supply | Qty: 30 | Fill #2 | Status: CN

## 2021-02-08 MED FILL — Trazodone HCl Tab 50 MG: ORAL | 30 days supply | Qty: 30 | Fill #2 | Status: AC

## 2021-02-08 MED FILL — Cariprazine HCl Cap 1.5 MG (Base Equivalent): ORAL | 30 days supply | Qty: 30 | Fill #2 | Status: AC

## 2021-02-12 ENCOUNTER — Other Ambulatory Visit: Payer: Self-pay | Admitting: Family Medicine

## 2021-02-12 DIAGNOSIS — M545 Low back pain, unspecified: Secondary | ICD-10-CM

## 2021-02-14 ENCOUNTER — Other Ambulatory Visit: Payer: Self-pay

## 2021-03-01 ENCOUNTER — Other Ambulatory Visit: Payer: Self-pay

## 2021-03-03 ENCOUNTER — Encounter (HOSPITAL_COMMUNITY): Payer: Self-pay | Admitting: Psychiatry

## 2021-03-03 ENCOUNTER — Telehealth (INDEPENDENT_AMBULATORY_CARE_PROVIDER_SITE_OTHER): Payer: Medicare HMO | Admitting: Psychiatry

## 2021-03-03 ENCOUNTER — Other Ambulatory Visit: Payer: Self-pay

## 2021-03-03 DIAGNOSIS — R69 Illness, unspecified: Secondary | ICD-10-CM | POA: Diagnosis not present

## 2021-03-03 DIAGNOSIS — F3341 Major depressive disorder, recurrent, in partial remission: Secondary | ICD-10-CM

## 2021-03-03 DIAGNOSIS — F411 Generalized anxiety disorder: Secondary | ICD-10-CM

## 2021-03-03 DIAGNOSIS — F319 Bipolar disorder, unspecified: Secondary | ICD-10-CM | POA: Diagnosis not present

## 2021-03-03 MED ORDER — CARIPRAZINE HCL 1.5 MG PO CAPS
ORAL_CAPSULE | ORAL | 2 refills | Status: DC
  Filled 2021-03-03 – 2021-03-17 (×2): qty 30, 30d supply, fill #0
  Filled 2021-03-20: qty 90, 90d supply, fill #0

## 2021-03-03 MED ORDER — MELATONIN 5 MG PO TABS
ORAL_TABLET | Freq: Every day | ORAL | 2 refills | Status: DC
  Filled 2021-03-03: qty 30, fill #0

## 2021-03-03 MED ORDER — CITALOPRAM HYDROBROMIDE 40 MG PO TABS
ORAL_TABLET | Freq: Every day | ORAL | 2 refills | Status: DC
  Filled 2021-03-03: qty 30, 30d supply, fill #0

## 2021-03-03 MED ORDER — TRAZODONE HCL 50 MG PO TABS
ORAL_TABLET | Freq: Every day | ORAL | 2 refills | Status: DC
  Filled 2021-03-03 – 2021-03-17 (×2): qty 30, 30d supply, fill #0
  Filled 2021-04-20 – 2021-04-28 (×2): qty 30, 30d supply, fill #1

## 2021-03-03 MED ORDER — HYDROXYZINE HCL 25 MG PO TABS
ORAL_TABLET | Freq: Three times a day (TID) | ORAL | 2 refills | Status: DC | PRN
  Filled 2021-03-03 – 2021-03-17 (×2): qty 90, 30d supply, fill #0

## 2021-03-03 NOTE — Progress Notes (Signed)
BH MD/PA/NP OP Progress Note Virtual Visit via Telephone Note  I connected with Amanda Larson on 03/03/21 at  8:00 AM EDT by telephone and verified that I am speaking with the correct person using two identifiers.  Location: Patient: home Provider: Clinic   I discussed the limitations, risks, security and privacy concerns of performing an evaluation and management service by telephone and the availability of in person appointments. I also discussed with the patient that there may be a patient responsible charge related to this service. The patient expressed understanding and agreed to proceed.   I provided 30 minutes of non-face-to-face time during this encounter.   03/03/2021 8:17 AM Amanda Larson  MRN:  [id number]  Chief Complaint: "I'm doing great"    HPI: 55 year old female seen today for follow up psychiatric evaluation. She has a psychiatric history of Bipolar I disorder, depression, and anxiety. She is currently managed on Vraylar 1.5mg  daily, Celexa 40 mg daily, hydroxyzine 25 mg three times daily, Trazodone 50 mg nightly as needed, and melatonin 5mg  at night. She notes that her medications are somewhat effective in managing her psychiatric conditions.    Today she was unable to login virtually so her assessment was done over the phone. During exam she was pleasant, cooperative, and engaged in conversation She informed Clinical research associatewriter that she is doing great. She notes that she has been spending time with family and staying busy. She informed Clinical research associatewriter that today she was taking her 56 year old grandson to a doctors appointment, her granddaughter to work, and her neighbor to the store.  Toady she notes her anxiety/depression has somewhat improved. Today provider conducted a GAD 7 and patient scored a 9, at her last visit she scored a 10. She notes she continues to be anxious when driving however reports she has been praying while in the car and it has been somewhat helpful.  Provider also  conducted a PHQ 9 and patient scored an 7, at her last visit she scored an 8. She endorses adequate sleep and increased appetite. She denies SI/HI/VAH, mania, or paranoia.  No medication changes made today. She will continue all medications as prescribed and will follow up with outpatient counseling for therapy.  No other concerns at this time.  Visit Diagnosis:    ICD-10-CM   1. Bipolar I disorder (HCC)  F31.9 cariprazine (VRAYLAR) 1.5 MG capsule    traZODone (DESYREL) 50 MG tablet    melatonin 5 MG TABS    2. Recurrent major depressive disorder, in partial remission (HCC)  F33.41 citalopram (CELEXA) 40 MG tablet    traZODone (DESYREL) 50 MG tablet    melatonin 5 MG TABS    3. Generalized anxiety disorder  F41.1 citalopram (CELEXA) 40 MG tablet    hydrOXYzine (ATARAX/VISTARIL) 25 MG tablet      Past Psychiatric History: depression, anxiety, mood disorder, and PTSD.  Past Medical History:  Past Medical History:  Diagnosis Date   Bronchitis    Chronic back pain    Depression    Diabetes mellitus without complication (HCC)    Osteoarthritis     Past Surgical History:  Procedure Laterality Date   CESAREAN SECTION     CHOLECYSTECTOMY     TUBAL LIGATION      Family Psychiatric History: Daughter ADHD and Bipolar, Daughter ADHD and PTSD, Son ADHD, Grandson ADD and ADHD  Family History:  Family History  Problem Relation Age of Onset   Cancer Father  skin   Heart disease Paternal Grandfather    Stroke Paternal Grandfather    Drug abuse Paternal Grandfather    High Cholesterol Mother    Diabetes Maternal Aunt    Hypertension Maternal Aunt    Diabetes Maternal Uncle     Social History:  Social History   Socioeconomic History   Marital status: Significant Other    Spouse name: Not on file   Number of children: 3   Years of education: Not on file   Highest education level: Some college, no degree  Occupational History   Not on file  Tobacco Use   Smoking  status: Former    Pack years: 0.00    Types: Cigarettes    Quit date: 07/31/2007    Years since quitting: 13.6   Smokeless tobacco: Never  Vaping Use   Vaping Use: Never used  Substance and Sexual Activity   Alcohol use: Yes    Comment: 3 drinks a year    Drug use: Yes    Types: Marijuana   Sexual activity: Yes    Birth control/protection: Surgical  Other Topics Concern   Not on file  Social History Narrative   Not on file   Social Determinants of Health   Financial Resource Strain: Not on file  Food Insecurity: Not on file  Transportation Needs: Not on file  Physical Activity: Not on file  Stress: Not on file  Social Connections: Not on file    Allergies:  Allergies  Allergen Reactions   Sulfa Antibiotics Anaphylaxis and Swelling   Clarithromycin Nausea Only    Metabolic Disorder Labs: Lab Results  Component Value Date   HGBA1C 8.6 (A) 01/19/2021   No results found for: PROLACTIN Lab Results  Component Value Date   CHOL 205 (H) 11/04/2020   TRIG 218 (H) 11/04/2020   HDL 35 (L) 11/04/2020   CHOLHDL 5.9 (H) 11/04/2020   LDLCALC 131 (H) 11/04/2020   LDLCALC 127 (H) 07/16/2019   No results found for: TSH  Therapeutic Level Labs: No results found for: LITHIUM No results found for: VALPROATE No components found for:  CBMZ  Current Medications: Current Outpatient Medications  Medication Sig Dispense Refill   meloxicam (MOBIC) 15 MG tablet Take 1 tablet by mouth once daily 30 tablet 0   albuterol (VENTOLIN HFA) 108 (90 Base) MCG/ACT inhaler Inhale 2 puffs into the lungs every 6 (six) hours as needed for wheezing or shortness of breath. 6.7 g 4   cariprazine (VRAYLAR) 1.5 MG capsule TAKE 1 CAPSULE (1.5 MG TOTAL) BY MOUTH DAILY. 30 capsule 2   citalopram (CELEXA) 40 MG tablet TAKE 1 TABLET (40 MG TOTAL) BY MOUTH DAILY. 30 tablet 2   Diclofenac Sodium 3 % GEL Apply 2 g topically in the morning, at noon, in the evening, and at bedtime. 100 g 2   Dulaglutide  (TRULICITY) 3 MG/0.5ML SOPN Inject 3 mg as directed once a week. 0.5 mL 3   famotidine (PEPCID) 20 MG tablet Take 1 tablet (20 mg total) by mouth 2 (two) times daily. 60 tablet 2   gabapentin (NEURONTIN) 300 MG capsule Take 1 capsule (300 mg total) by mouth 3 (three) times daily. 30 capsule 2   hydrOXYzine (ATARAX/VISTARIL) 25 MG tablet TAKE 1 TABLET (25 MG TOTAL) BY MOUTH 3 (THREE) TIMES DAILY AS NEEDED FOR ANXIETY. 90 tablet 2   melatonin 5 MG TABS TAKE 1 TABLET (5 MG TOTAL) BY MOUTH AT BEDTIME. 30 tablet 2   metFORMIN (GLUCOPHAGE-XR) 500  MG 24 hr tablet TAKE 3 TABLETS BY MOUTH ONCE DAILY WITH BREAKFAST 90 tablet 3   Omega-3 Fatty Acids (FISH OIL) 1000 MG CAPS Take by mouth.     simvastatin (ZOCOR) 40 MG tablet TAKE 1 TABLET BY MOUTH AT BEDTIME 30 tablet 6   traZODone (DESYREL) 50 MG tablet TAKE 1 TABLET (50 MG TOTAL) BY MOUTH AT BEDTIME. 30 tablet 2   triamcinolone ointment (KENALOG) 0.5 % Apply 1 application topically 2 (two) times daily. 30 g 0   No current facility-administered medications for this visit.     Musculoskeletal: Strength & Muscle Tone:  Unable to assess due to telephone visit Gait & Station: Unable to assess due to telephone visit Patient leans: N/A  Psychiatric Specialty Exam: Review of Systems  Last menstrual period 01/17/2014.There is no height or weight on file to calculate BMI.  General Appearance:  Unable to assess due to telephone visit  Eye Contact:   Unable to assess due to telephone visit  Speech:  Clear and Coherent and Normal Rate  Volume:  Normal  Mood:  Euthymic  Affect:  Appropriate and Congruent  Thought Process:  Coherent, Goal Directed and Linear  Orientation:  Full (Time, Place, and Person)  Thought Content: WDL and Logical   Suicidal Thoughts:  No  Homicidal Thoughts:  No  Memory:  Immediate;   Good Recent;   Good Remote;   Good  Judgement:  Good  Insight:  Good  Psychomotor Activity:  Normal  Concentration:  Concentration: Good and  Attention Span: Good  Recall:  Good  Fund of Knowledge: Good  Language: Good  Akathisia:  No  Handed:  Right  AIMS (if indicated): Not done  Assets:  Communication Skills Desire for Improvement Financial Resources/Insurance Housing Social Support  ADL's:  Intact  Cognition: WNL  Sleep:  Good   Screenings: GAD-7    Flowsheet Row Video Visit from 03/03/2021 in [facility] Clinical Support from 11/14/2020 in [facility] Video Visit from 07/07/2020 in [facility]  Total GAD-7 Score 9 10 10       PHQ2-9    Flowsheet Row Video Visit from 03/03/2021 in [facility] Office Visit from 01/19/2021 in [facility] Clinical Support from 11/14/2020 in [facility] Office Visit from 11/04/2020 in [facility] Video Visit from 07/07/2020 in [facility]  PHQ-2 Total Score 2 4 2 2 2   PHQ-9 Total Score 7 9 8 9 8       Flowsheet Row Clinical Support from 11/14/2020 in [facility]  C-SSRS RISK CATEGORY No Risk        Assessment and Plan: Patient notes that her anxiety and depression has improved since her last visit. No medication changes made today. Patient agreeable to continue medications as prescribed.  1. Bipolar I disorder (HCC)  Continue- traZODone (DESYREL) 50 MG tablet; Take 1 tablet (50 mg total) by mouth at bedtime.  Dispense: 30 tablet; Refill: 2 Continue- melatonin 5 MG TABS tablet; Take 1 tablet (5 mg total) by mouth at bedtime.  Dispense: 30 tablet; Refill: 2 Continue- cariprazine (VRAYLAR) capsule; Take 1 capsule (1.5 mg total) by mouth daily.  Dispense: 30 capsule; Refill: 2   2. Recurrent major depressive disorder, in partial remission (HCC)  Continue- citalopram (CELEXA) 40 MG tablet; Take 1 tablet (40 mg total) by mouth daily.  Dispense: 30  tablet;  Refill: 2 Continue melatonin 5 MG TABS tablet; Take 1 tablet (5 mg total) by mouth at bedtime.  Dispense: 30 tablet; Refill: 2   3. Generalized anxiety disorder  Increased hydrOXYzine (ATARAX/VISTARIL) 25 MG tablet; Take 1 tablet (25mg  total) by mouth 3 (three) times daily as needed for anxiety.  Dispense: 90 tablet; Refill: 2   Follow up in 3 months  Follow up with therapy  , NP 03/03/2021, 8:17 AM

## 2021-03-11 ENCOUNTER — Other Ambulatory Visit: Payer: Self-pay | Admitting: Family Medicine

## 2021-03-11 DIAGNOSIS — M1712 Unilateral primary osteoarthritis, left knee: Secondary | ICD-10-CM

## 2021-03-11 DIAGNOSIS — G8929 Other chronic pain: Secondary | ICD-10-CM

## 2021-03-17 ENCOUNTER — Other Ambulatory Visit: Payer: Self-pay

## 2021-03-20 ENCOUNTER — Other Ambulatory Visit: Payer: Self-pay

## 2021-03-26 ENCOUNTER — Other Ambulatory Visit: Payer: Self-pay | Admitting: Family Medicine

## 2021-03-26 DIAGNOSIS — G8929 Other chronic pain: Secondary | ICD-10-CM

## 2021-03-26 DIAGNOSIS — M1712 Unilateral primary osteoarthritis, left knee: Secondary | ICD-10-CM

## 2021-03-26 DIAGNOSIS — M5442 Lumbago with sciatica, left side: Secondary | ICD-10-CM

## 2021-04-09 ENCOUNTER — Other Ambulatory Visit: Payer: Self-pay | Admitting: Family Medicine

## 2021-04-09 DIAGNOSIS — G8929 Other chronic pain: Secondary | ICD-10-CM

## 2021-04-09 DIAGNOSIS — E785 Hyperlipidemia, unspecified: Secondary | ICD-10-CM

## 2021-04-09 DIAGNOSIS — M545 Low back pain, unspecified: Secondary | ICD-10-CM

## 2021-04-09 DIAGNOSIS — E119 Type 2 diabetes mellitus without complications: Secondary | ICD-10-CM

## 2021-04-20 ENCOUNTER — Other Ambulatory Visit: Payer: Self-pay

## 2021-04-27 ENCOUNTER — Other Ambulatory Visit: Payer: Self-pay

## 2021-04-28 ENCOUNTER — Other Ambulatory Visit: Payer: Self-pay

## 2021-05-09 ENCOUNTER — Other Ambulatory Visit: Payer: Self-pay | Admitting: Family Medicine

## 2021-05-09 DIAGNOSIS — G8929 Other chronic pain: Secondary | ICD-10-CM

## 2021-06-02 ENCOUNTER — Other Ambulatory Visit: Payer: Self-pay

## 2021-06-02 ENCOUNTER — Encounter (HOSPITAL_COMMUNITY): Payer: Self-pay | Admitting: Psychiatry

## 2021-06-02 ENCOUNTER — Telehealth (INDEPENDENT_AMBULATORY_CARE_PROVIDER_SITE_OTHER): Payer: Medicare HMO | Admitting: Psychiatry

## 2021-06-02 DIAGNOSIS — R69 Illness, unspecified: Secondary | ICD-10-CM | POA: Diagnosis not present

## 2021-06-02 DIAGNOSIS — F3341 Major depressive disorder, recurrent, in partial remission: Secondary | ICD-10-CM

## 2021-06-02 DIAGNOSIS — F411 Generalized anxiety disorder: Secondary | ICD-10-CM

## 2021-06-02 DIAGNOSIS — F319 Bipolar disorder, unspecified: Secondary | ICD-10-CM | POA: Diagnosis not present

## 2021-06-02 MED ORDER — CARIPRAZINE HCL 1.5 MG PO CAPS
ORAL_CAPSULE | ORAL | 3 refills | Status: DC
Start: 2021-06-02 — End: 2021-09-08
  Filled 2021-06-02 – 2021-06-09 (×2): qty 30, 30d supply, fill #0

## 2021-06-02 MED ORDER — TRAZODONE HCL 50 MG PO TABS
ORAL_TABLET | Freq: Every day | ORAL | 3 refills | Status: DC
Start: 2021-06-02 — End: 2021-09-08
  Filled 2021-06-02 – 2021-06-09 (×2): qty 30, 30d supply, fill #0
  Filled 2021-07-14: qty 30, 30d supply, fill #1
  Filled 2021-08-13: qty 30, 30d supply, fill #2

## 2021-06-02 MED ORDER — MELATONIN 5 MG PO TABS
ORAL_TABLET | Freq: Every day | ORAL | 3 refills | Status: DC
  Filled 2021-06-02: qty 30, fill #0

## 2021-06-02 MED ORDER — HYDROXYZINE HCL 25 MG PO TABS
ORAL_TABLET | Freq: Three times a day (TID) | ORAL | 3 refills | Status: DC | PRN
  Filled 2021-06-02: qty 90, 30d supply, fill #0
  Filled 2021-07-17: qty 30, 10d supply, fill #0

## 2021-06-02 MED ORDER — CITALOPRAM HYDROBROMIDE 40 MG PO TABS
ORAL_TABLET | Freq: Every day | ORAL | 3 refills | Status: DC
  Filled 2021-06-02 – 2021-06-09 (×2): qty 30, 30d supply, fill #0
  Filled 2021-07-14: qty 30, 30d supply, fill #1
  Filled 2021-08-15: qty 30, 30d supply, fill #2

## 2021-06-02 NOTE — Progress Notes (Signed)
BH MD/PA/NP OP Progress Note Virtual Visit via Telephone Note  I connected with Amanda Larson on 06/02/21 at  9:00 AM EDT by telephone and verified that I am speaking with the correct person using two identifiers.  Location: Patient: home Provider: Clinic   I discussed the limitations, risks, security and privacy concerns of performing an evaluation and management service by telephone and the availability of in person appointments. I also discussed with the patient that there may be a patient responsible charge related to this service. The patient expressed understanding and agreed to proceed.   I provided 30 minutes of non-face-to-face time during this encounter.   06/02/2021 9:01 AM Amanda Larson  MRN:  [id number]  Chief Complaint: "I'm doing great"    HPI: 55 year old female seen today for follow up psychiatric evaluation. She has a psychiatric history of Bipolar I disorder, depression, and anxiety. She is currently managed on Vraylar 1.5mg  daily, Celexa 40 mg daily, hydroxyzine 25 mg three times daily, Trazodone 50 mg nightly as needed, and melatonin 5mg  at night. She notes that her medications are somewhat effective in managing her psychiatric conditions.    Today she was unable to login virtually so her assessment was done over the phone. During exam she was pleasant, cooperative, and engaged in conversation She informed that she is doing great. She notes that her mood is stable and she has minimal anxiety and depression. Today provider conducted a GAD 7 and patient scored a 11, at her last visit she scored a 9. She notes she continues to be anxious when in the car but notes that she is able to cope with it.  Provider also conducted a PHQ 9 and patient scored an 3, at her last visit she scored an 7. She endorses adequate sleep and increased appetite. She denies SI/HI/VAH, mania, or paranoia.  Patient notes that she continues to have pain in her back and legs. She  informed Clinical research associate that Mobic and gabapentin are somewhat effective in managing her pain.   No medication changes made today. She will continue all medications as prescribed and will follow up with outpatient counseling for therapy.  No other concerns at this time.  Visit Diagnosis:    ICD-10-CM   1. Bipolar I disorder (HCC)  F31.9 cariprazine (VRAYLAR) 1.5 MG capsule    melatonin 5 MG TABS    traZODone (DESYREL) 50 MG tablet    2. Recurrent major depressive disorder, in partial remission (HCC)  F33.41 citalopram (CELEXA) 40 MG tablet    melatonin 5 MG TABS    traZODone (DESYREL) 50 MG tablet    3. Generalized anxiety disorder  F41.1 citalopram (CELEXA) 40 MG tablet    hydrOXYzine (ATARAX/VISTARIL) 25 MG tablet      Past Psychiatric History: depression, anxiety, mood disorder, and PTSD.  Past Medical History:  Past Medical History:  Diagnosis Date   Bronchitis    Chronic back pain    Depression    Diabetes mellitus without complication (HCC)    Osteoarthritis     Past Surgical History:  Procedure Laterality Date   CESAREAN SECTION     CHOLECYSTECTOMY     TUBAL LIGATION      Family Psychiatric History: Daughter ADHD and Bipolar, Daughter ADHD and PTSD, Son ADHD, Grandson ADD and ADHD  Family History:  Family History  Problem Relation Age of Onset   Cancer Father        skin   Heart disease Paternal Grandfather  Stroke Paternal Grandfather    Drug abuse Paternal Grandfather    High Cholesterol Mother    Diabetes Maternal Aunt    Hypertension Maternal Aunt    Diabetes Maternal Uncle     Social History:  Social History   Socioeconomic History   Marital status: Significant Other    Spouse name: Not on file   Number of children: 3   Years of education: Not on file   Highest education level: Some college, no degree  Occupational History   Not on file  Tobacco Use   Smoking status: Former    Types: Cigarettes    Quit date: 07/31/2007    Years since quitting:  13.8   Smokeless tobacco: Never  Vaping Use   Vaping Use: Never used  Substance and Sexual Activity   Alcohol use: Yes    Comment: 3 drinks a year    Drug use: Yes    Types: Marijuana   Sexual activity: Yes    Birth control/protection: Surgical  Other Topics Concern   Not on file  Social History Narrative   Not on file   Social Determinants of Health   Financial Resource Strain: Not on file  Food Insecurity: Not on file  Transportation Needs: Not on file  Physical Activity: Not on file  Stress: Not on file  Social Connections: Not on file    Allergies:  Allergies  Allergen Reactions   Sulfa Antibiotics Anaphylaxis and Swelling   Clarithromycin Nausea Only    Metabolic Disorder Labs: Lab Results  Component Value Date   HGBA1C 8.6 (A) 01/19/2021   No results found for: PROLACTIN Lab Results  Component Value Date   CHOL 205 (H) 11/04/2020   TRIG 218 (H) 11/04/2020   HDL 35 (L) 11/04/2020   CHOLHDL 5.9 (H) 11/04/2020   LDLCALC 131 (H) 11/04/2020   LDLCALC 127 (H) 07/16/2019   No results found for: TSH  Therapeutic Level Labs: No results found for: LITHIUM No results found for: VALPROATE No components found for:  CBMZ  Current Medications: Current Outpatient Medications  Medication Sig Dispense Refill   simvastatin (ZOCOR) 40 MG tablet TAKE 1 TABLET BY MOUTH AT BEDTIME 30 tablet 4   albuterol (VENTOLIN HFA) 108 (90 Base) MCG/ACT inhaler Inhale 2 puffs into the lungs every 6 (six) hours as needed for wheezing or shortness of breath. 6.7 g 4   cariprazine (VRAYLAR) 1.5 MG capsule TAKE 1 CAPSULE (1.5 MG TOTAL) BY MOUTH DAILY. 30 capsule 3   citalopram (CELEXA) 40 MG tablet TAKE 1 TABLET (40 MG TOTAL) BY MOUTH DAILY. 30 tablet 3   Diclofenac Sodium 3 % GEL Apply 2 g topically in the morning, at noon, in the evening, and at bedtime. 100 g 2   Dulaglutide (TRULICITY) 3 MG/0.5ML SOPN Inject 3 mg as directed once a week. 0.5 mL 3   famotidine (PEPCID) 20 MG tablet  Take 1 tablet (20 mg total) by mouth 2 (two) times daily. 60 tablet 2   gabapentin (NEURONTIN) 300 MG capsule TAKE 1 CAPSULE BY MOUTH THREE TIMES DAILY 90 capsule 2   hydrOXYzine (ATARAX/VISTARIL) 25 MG tablet TAKE 1 TABLET (25 MG TOTAL) BY MOUTH 3 (THREE) TIMES DAILY AS NEEDED FOR ANXIETY. 90 tablet 3   melatonin 5 MG TABS TAKE 1 TABLET (5 MG TOTAL) BY MOUTH AT BEDTIME. 30 tablet 3   meloxicam (MOBIC) 15 MG tablet Take 1 tablet by mouth once daily 30 tablet 0   metFORMIN (GLUCOPHAGE-XR) 500 MG 24 hr  tablet TAKE 3 TABLETS BY MOUTH ONCE DAILY WITH BREAKFAST 90 tablet 0   Omega-3 Fatty Acids (FISH OIL) 1000 MG CAPS Take by mouth.     traZODone (DESYREL) 50 MG tablet TAKE 1 TABLET (50 MG TOTAL) BY MOUTH AT BEDTIME. 30 tablet 3   triamcinolone ointment (KENALOG) 0.5 % Apply 1 application topically 2 (two) times daily. 30 g 0   No current facility-administered medications for this visit.     Musculoskeletal: Strength & Muscle Tone:  Unable to assess due to telephone visit Gait & Station: Unable to assess due to telephone visit Patient leans: N/A  Psychiatric Specialty Exam: Review of Systems  Last menstrual period 01/17/2014.There is no height or weight on file to calculate BMI.  General Appearance:  Unable to assess due to telephone visit  Eye Contact:   Unable to assess due to telephone visit  Speech:  Clear and Coherent and Normal Rate  Volume:  Normal  Mood:  Euthymic  Affect:  Appropriate and Congruent  Thought Process:  Coherent, Goal Directed and Linear  Orientation:  Full (Time, Place, and Person)  Thought Content: WDL and Logical   Suicidal Thoughts:  No  Homicidal Thoughts:  No  Memory:  Immediate;   Good Recent;   Good Remote;   Good  Judgement:  Good  Insight:  Good  Psychomotor Activity:  Normal  Concentration:  Concentration: Good and Attention Span: Good  Recall:  Good  Fund of Knowledge: Good  Language: Good  Akathisia:  No  Handed:  Right  AIMS (if indicated):  Not done  Assets:  Communication Skills Desire for Improvement Financial Resources/Insurance Housing Social Support  ADL's:  Intact  Cognition: WNL  Sleep:  Good   Screenings: GAD-7    Flowsheet Row Video Visit from 06/02/2021 in [facility] Video Visit from 03/03/2021 in [facility] Clinical Support from 11/14/2020 in [facility] Video Visit from 07/07/2020 in [facility]  Total GAD-7 Score 11 9 10 10       PHQ2-9    Flowsheet Row Video Visit from 06/02/2021 in [facility] Video Visit from 03/03/2021 in [facility] Office Visit from 01/19/2021 in [facility] Clinical Support from 11/14/2020 in [facility] Office Visit from 11/04/2020 in [facility]  PHQ-2 Total Score 0 2 4 2 2   PHQ-9 Total Score 3 7 9 8 9       Flowsheet Row Clinical Support from 11/14/2020 in [facility]  C-SSRS RISK CATEGORY No Risk        Assessment and Plan: Patient notes that her anxiety and depression has improved since her last visit. No medication changes made today. Patient agreeable to continue medications as prescribed.  1. Bipolar I disorder (HCC)  Continue- traZODone (DESYREL) 50 MG tablet; Take 1 tablet (50 mg total) by mouth at bedtime.  Dispense: 30 tablet; Refill: 3 Continue- melatonin 5 MG TABS tablet; Take 1 tablet (5 mg total) by mouth at bedtime.  Dispense: 30 tablet; Refill: 3 Continue- cariprazine (VRAYLAR) capsule; Take 1 capsule (1.5 mg total) by mouth daily.  Dispense: 30 capsule; Refill: 3   2. Recurrent major depressive disorder, in partial remission (HCC)  Continue- citalopram (CELEXA) 40 MG tablet; Take 1 tablet (40 mg total) by mouth daily.  Dispense: 30 tablet; Refill: 3 Continue melatonin 5 MG TABS tablet; Take 1  tablet (5 mg total) by mouth at bedtime.  Dispense: 30 tablet; Refill: 3   3. Generalized anxiety disorder  Increased hydrOXYzine (ATARAX/VISTARIL) 25 MG tablet; Take 1 tablet (25mg  total) by mouth 3 (three) times daily as needed for anxiety.  Dispense: 90 tablet; Refill: 3   Follow up in 3 months  Follow up with therapy  , NP 06/02/2021, 9:01 AM

## 2021-06-09 ENCOUNTER — Other Ambulatory Visit: Payer: Self-pay

## 2021-06-10 ENCOUNTER — Other Ambulatory Visit: Payer: Self-pay | Admitting: Family Medicine

## 2021-06-10 DIAGNOSIS — G8929 Other chronic pain: Secondary | ICD-10-CM

## 2021-06-12 ENCOUNTER — Other Ambulatory Visit: Payer: Self-pay

## 2021-07-14 ENCOUNTER — Other Ambulatory Visit: Payer: Self-pay

## 2021-07-17 ENCOUNTER — Other Ambulatory Visit: Payer: Self-pay

## 2021-07-31 ENCOUNTER — Encounter: Payer: Self-pay | Admitting: Family Medicine

## 2021-07-31 ENCOUNTER — Ambulatory Visit (INDEPENDENT_AMBULATORY_CARE_PROVIDER_SITE_OTHER): Payer: Medicare HMO | Admitting: Family Medicine

## 2021-07-31 ENCOUNTER — Other Ambulatory Visit: Payer: Self-pay

## 2021-07-31 VITALS — BP 156/90 | HR 77 | Wt 312.2 lb

## 2021-07-31 DIAGNOSIS — Z23 Encounter for immunization: Secondary | ICD-10-CM

## 2021-07-31 DIAGNOSIS — Z791 Long term (current) use of non-steroidal anti-inflammatories (NSAID): Secondary | ICD-10-CM | POA: Diagnosis not present

## 2021-07-31 DIAGNOSIS — E785 Hyperlipidemia, unspecified: Secondary | ICD-10-CM

## 2021-07-31 DIAGNOSIS — E0800 Diabetes mellitus due to underlying condition with hyperosmolarity without nonketotic hyperglycemic-hyperosmolar coma (NKHHC): Secondary | ICD-10-CM

## 2021-07-31 DIAGNOSIS — E1165 Type 2 diabetes mellitus with hyperglycemia: Secondary | ICD-10-CM

## 2021-07-31 DIAGNOSIS — E11628 Type 2 diabetes mellitus with other skin complications: Secondary | ICD-10-CM | POA: Diagnosis not present

## 2021-07-31 LAB — POCT GLYCOSYLATED HEMOGLOBIN (HGB A1C): HbA1c, POC (controlled diabetic range): 6.4 % (ref 0.0–7.0)

## 2021-07-31 MED ORDER — TRULICITY 3 MG/0.5ML ~~LOC~~ SOAJ
3.0000 mg | SUBCUTANEOUS | 3 refills | Status: DC
Start: 2021-07-31 — End: 2022-06-20

## 2021-07-31 NOTE — Progress Notes (Signed)
      SUBJECTIVE:   CHIEF COMPLAINT / HPI:   Chief Complaint  Patient presents with   Diabetes     Amanda Larson is a 55 y.o. female here for follow of her chronic medical conditions.    Diabetes Mellitus  Has been watching what she eats. Trying to limit carb dense foods. Denies missing any doses of DM medications and takes Trulicity (Mondays) and metformin. Denies increased thirst, hunger, or frequent urination. Home blood sugars 109-152. Noticed she has been losing weight since starting Trulicity.   HLD Patient taking Zicor.. Denies missing any doses. Reports no medication side effects.     PERTINENT  PMH / PSH: reviewed and updated as appropriate   OBJECTIVE:   BP (!) 156/90   Pulse 77   Wt (!) 312 lb 4 oz (141.6 kg)   LMP 01/17/2014   SpO2 98%   BMI 59.00 kg/m    GEN: pleasant well appearing female, in no acute distress  CV: regular rate and rhythm, no murmurs appreciated  RESP: no increased work of breathing, clear to ascultation bilaterally ABD: Bowel sounds present. Soft, obese abdomen, non-tender, non-distended.  SKIN: warm, dry, no rash on visible skin  ASSESSMENT/PLAN:   Type 2 diabetes mellitus with hyperglycemia (HCC) Continue current regimen: metformin 1500 mg ER and Trulicity. Pt's medication are through [employer]. Will need new application. Contacted pharmacy who will get application renewal process started. A1c today 6.2 and previously 8.6. Encouraged continued diet rich in vegetables and complex carbs.  Heart healthy carb modified diet. Counseled on need to continue exercising.   Statin therapy: Zocor  ACEi/ARB: None, declined at this time Urine microalbumine: UTD Eye exam: Advised to get  -Follow up BMP  Hyperlipidemia LDL today. Taking Zicor.      Declined COVID but received influenza vaccine.   Katha Cabal, DO PGY-3, [facility] 07/31/2021

## 2021-07-31 NOTE — Patient Instructions (Addendum)
Call for [facility] for an appointment:  Address: [address] [place], [place] [address] Phone: [phone]  It was great seeing you today!  Please check-out at the front desk before leaving the clinic. I'd like to see you back in 3 months but if you need to be seen earlier than that for any new issues we're happy to fit you in, just give [place] a call!  Visit Remembers: - Stop by the pharmacy to pick up your prescriptions  - Continue to work on your healthy eating habits and incorporating exercise into your daily life. (see below) - Your goal is to have an A1c < 7 - Medicine Changes: none - Consider going to a Rheumatologist for your psoriasis and joint pain      Diet Recommendations for Diabetes  Carbohydrate includes starch, sugar, and fiber.  Of these, only sugar and starch raise blood glucose.  (Fiber is found in fruits, vegetables [especially skin, seeds, and stalks] and whole grains.)   Starchy (carb) foods: Bread, rice, pasta, potatoes, corn, cereal, grits, crackers, bagels, muffins, all baked goods.  (Fruit, milk, and yogurt also have carbohydrate, but most of these foods will not spike your blood sugar as most starchy foods will.)  A few fruits do cause high blood sugars; use small portions of bananas (limit to 1/2 at a time), grapes, watermelon, oranges, and most tropical fruits.   Protein foods: Meat, fish, poultry, eggs, dairy foods, and beans such as pinto and kidney beans (beans also provide carbohydrate).   1. Eat at least REAL 3 meals and 1-2 snacks per day. Never go more than 4-5 hours while awake without eating. Eat breakfast within the first hour of getting up.   2. Limit starchy foods to TWO per meal and ONE per snack. ONE portion of a starchy food is equal to the following:   - ONE slice of bread (or its equivalent, such as half of a hamburger bun).   - 1/2 cup of a "scoopable" starchy food such as potatoes or rice.   - 15 grams of Total Carbohydrate as  shown on food label.   - Every 4 ounces of a sweet drink (including fruit juice). 3. Include at every meal: a protein food, a carb food, and vegetables and/or fruit.   - Obtain twice the volume of veg's as protein or carbohydrate foods for both lunch and dinner.   - Fresh or frozen veg's are best.   - Keep frozen veg's on hand for a quick vegetable serving.       Regarding lab work today:  Due to recent changes in healthcare laws, you may see the results of your imaging and laboratory studies on MyChart before your doctor has had a chance to review them.  We understand that in some cases there may be results that are confusing or concerning to you. Not all laboratory results come back in the same time frame and your doctor may be waiting for multiple results in order to interpret others.  Please give [place] 72 hours in order for your doctor to thoroughly review all the results before contacting the office for clarification of your results. If everything is normal, you will get a letter in the mail or a message in My Chart. Please give [place] a call if you do not hear from [place] after 2 weeks.  Please bring all of your medications with you to each visit.    If you haven't already, sign up for My  Chart to have easy access to your labs results, and communication with your primary care physician.  Feel free to call with any questions or concerns at any time, at [phone].   Take care,  Dr. Katherina [facility]

## 2021-08-01 LAB — BASIC METABOLIC PANEL
BUN/Creatinine Ratio: 28 — ABNORMAL HIGH (ref 9–23)
BUN: 19 mg/dL (ref 6–24)
CO2: 25 mmol/L (ref 20–29)
Calcium: 10.2 mg/dL (ref 8.7–10.2)
Chloride: 104 mmol/L (ref 96–106)
Creatinine, Ser: 0.68 mg/dL (ref 0.57–1.00)
Glucose: 108 mg/dL — ABNORMAL HIGH (ref 70–99)
Potassium: 4.8 mmol/L (ref 3.5–5.2)
Sodium: 145 mmol/L — ABNORMAL HIGH (ref 134–144)
eGFR: 102 mL/min/{1.73_m2} (ref >59)

## 2021-08-01 LAB — LDL CHOLESTEROL, DIRECT: LDL Direct: 69 mg/dL (ref 0–99)

## 2021-08-02 ENCOUNTER — Telehealth: Payer: Self-pay

## 2021-08-02 NOTE — Telephone Encounter (Signed)
Left message requesting call back to discuss [employer] pt assistance re-enrollment  Call back [phone]

## 2021-08-05 NOTE — Assessment & Plan Note (Signed)
LDL today. Taking Zicor.

## 2021-08-05 NOTE — Assessment & Plan Note (Addendum)
Continue current regimen: metformin 1500 mg ER and Trulicity. Pt's medication are through [employer]. Will need new application. Contacted pharmacy who will get application renewal process started. A1c today 6.2 and previously 8.6. Encouraged continued diet rich in vegetables and complex carbs.  Heart healthy carb modified diet. Counseled on need to continue exercising.   Statin therapy: Zocor  ACEi/ARB: None, declined at this time Urine microalbumine: UTD Eye exam: Advised to get  -Follow up BMP

## 2021-08-07 ENCOUNTER — Other Ambulatory Visit: Payer: Self-pay | Admitting: Family Medicine

## 2021-08-08 ENCOUNTER — Other Ambulatory Visit: Payer: Self-pay

## 2021-08-13 ENCOUNTER — Other Ambulatory Visit: Payer: Self-pay | Admitting: Family Medicine

## 2021-08-13 DIAGNOSIS — M1712 Unilateral primary osteoarthritis, left knee: Secondary | ICD-10-CM

## 2021-08-13 DIAGNOSIS — M5442 Lumbago with sciatica, left side: Secondary | ICD-10-CM

## 2021-08-14 ENCOUNTER — Other Ambulatory Visit: Payer: Self-pay

## 2021-08-15 ENCOUNTER — Other Ambulatory Visit: Payer: Self-pay

## 2021-08-21 ENCOUNTER — Ambulatory Visit (INDEPENDENT_AMBULATORY_CARE_PROVIDER_SITE_OTHER): Payer: Medicare HMO

## 2021-08-21 ENCOUNTER — Other Ambulatory Visit: Payer: Self-pay

## 2021-08-21 ENCOUNTER — Ambulatory Visit: Payer: Medicare HMO | Admitting: Surgical

## 2021-08-21 DIAGNOSIS — M79605 Pain in left leg: Secondary | ICD-10-CM

## 2021-08-21 MED ORDER — PREDNISONE 5 MG (21) PO TBPK: ORAL_TABLET | ORAL | 0 refills | Status: DC

## 2021-08-21 NOTE — Progress Notes (Signed)
Office Visit Note   Patient: Amanda Larson           Date of Birth: [birth year]           MRN: [id number] Visit Date: 08/21/2021 Requested by: Katha Cabal, DO [address] [place],  [place] [address] PCP: Katha Cabal, DO  Subjective: Chief Complaint  Patient presents with   Left Leg - Pain    HPI: Amanda Larson is a 55 y.o. female who presents to the office complaining of left leg pain.  Patient complains of left leg radicular pain over the last 3 weeks without injury.  Describes pain that radiates down the posterior lateral aspect of the left leg to her big toe on the dorsum.  Also complains of left-sided low back pain and buttocks pain that has been worse than anything she is experienced in the past.  She has associated numbness to crated her toes.  Ambulating with a cane.  No history of spine surgery, previous MRI of the lumbar spine, ESI's in the spine.  Denies any groin pain, right-sided symptoms, significant left knee pain.  She does have history of significant meniscal pathology of the left knee but that is doing well according to her currently taking meloxicam and gabapentin for symptoms.  Denies any smoking.  Diabetes well controlled with A1c less than 7.  Denies any difficulty with urination or fecal incontinence.  She does have history of neuropathy with numbness and tingling in both feet currently..                ROS: All systems reviewed are negative as they relate to the chief complaint within the history of present illness.  Patient denies fevers or chills.  Assessment & Plan: Visit Diagnoses:  1. Pain in left leg     Plan: Patient is a 55 year old female who presents for evaluation of left leg radicular pain.  She has had 3 weeks of radicular pain without any injury.  Never really had this significant pain before.  Pain is waking her up at night.  Slight weakness of hip flexion on exam today.  Radiographs of the lumbar spine do demonstrate multilevel  degenerative changes.  Also demonstrate left hip arthritis but she has no groin pain or pain with hip range of motion on exam.  Plan is to prescribe Medrol Dosepak and have her return in 4 weeks for clinical recheck.  If no improvement at that time, order MRI of the lumbar spine for further evaluation of left leg radiculopathy.  Patient agreed with plan.  Instructed her to call the office if her symptoms significantly worsen in the meantime.  Follow-Up Instructions: No follow-ups on file.   Orders:  Orders Placed This Encounter  Procedures   XR KNEE 3 VIEW LEFT   XR Lumbar Spine 2-3 Views   Meds ordered this encounter  Medications   predniSONE (STERAPRED UNI-PAK 21 TAB) 5 MG (21) TBPK tablet    Sig: Take dosepak as directed    Dispense:  21 tablet    Refill:  0      Procedures: No procedures performed   Clinical Data: No additional findings.  Objective: Vital Signs: LMP 01/17/2014   Physical Exam:  Constitutional: Patient appears well-developed HEENT:  Head: Normocephalic Eyes:EOM are normal Neck: Normal range of motion Cardiovascular: Normal rate Pulmonary/chest: Effort normal Neurologic: Patient is alert Skin: Skin is warm Psychiatric: Patient has normal mood and affect  Ortho Exam: Ortho exam demonstrates left knee without  effusion.  5/5 motor strength of bilateral quadricep, hamstring, dorsiflexion, plantarflexion.  5 -/5 motor strength of left hip flexion compared with 5/5 of right hip flexion.  Positive straight leg raise on left, negative on the right.  Sensation intact through all dermatomes of the bilateral lower extremities.  No pain with hip range of motion.  Negative Stinchfield sign.  Specialty Comments:  No specialty comments available.  Imaging: No results found.   PMFS History: Patient Active Problem List   Diagnosis Date Noted   Bipolar I disorder (HCC) 03/30/2020   Recurrent major depressive disorder, in partial remission (HCC) 03/30/2020    Generalized anxiety disorder 03/30/2020   Obstructive sleep apnea 11/21/2018   Osteoarthritis of left knee 05/15/2018   Gastro-esophageal reflux 05/15/2018   Hyperlipidemia 12/10/2017   Type 2 diabetes mellitus with hyperglycemia (HCC) 11/18/2017   Depression 02/08/2017   Chronic low back pain 02/08/2017   Asthma 02/08/2017   Psoriasis 02/08/2017   Healthcare maintenance 02/08/2017   Past Medical History:  Diagnosis Date   Bronchitis    Chronic back pain    Depression    Diabetes mellitus without complication (HCC)    Osteoarthritis     Family History  Problem Relation Age of Onset   Cancer Father        skin   Heart disease Paternal Grandfather    Stroke Paternal Grandfather    Drug abuse Paternal Grandfather    High Cholesterol Mother    Diabetes Maternal Aunt    Hypertension Maternal Aunt    Diabetes Maternal Uncle     Past Surgical History:  Procedure Laterality Date   CESAREAN SECTION     CHOLECYSTECTOMY     TUBAL LIGATION     Social History   Occupational History   Not on file  Tobacco Use   Smoking status: Former    Types: Cigarettes    Quit date: 07/31/2007    Years since quitting: 14.0   Smokeless tobacco: Never  Vaping Use   Vaping Use: Never used  Substance and Sexual Activity   Alcohol use: Yes    Comment: 3 drinks a year    Drug use: Yes    Types: Marijuana   Sexual activity: Yes    Birth control/protection: Surgical

## 2021-09-06 ENCOUNTER — Other Ambulatory Visit: Payer: Self-pay | Admitting: Family Medicine

## 2021-09-06 DIAGNOSIS — G8929 Other chronic pain: Secondary | ICD-10-CM

## 2021-09-06 DIAGNOSIS — M545 Low back pain, unspecified: Secondary | ICD-10-CM

## 2021-09-06 DIAGNOSIS — E119 Type 2 diabetes mellitus without complications: Secondary | ICD-10-CM

## 2021-09-06 DIAGNOSIS — E785 Hyperlipidemia, unspecified: Secondary | ICD-10-CM

## 2021-09-07 ENCOUNTER — Encounter (HOSPITAL_COMMUNITY): Payer: Self-pay

## 2021-09-08 ENCOUNTER — Other Ambulatory Visit: Payer: Self-pay

## 2021-09-08 ENCOUNTER — Encounter (HOSPITAL_COMMUNITY): Payer: Self-pay | Admitting: Psychiatry

## 2021-09-08 ENCOUNTER — Telehealth (HOSPITAL_COMMUNITY): Payer: Self-pay | Admitting: *Deleted

## 2021-09-08 ENCOUNTER — Telehealth (INDEPENDENT_AMBULATORY_CARE_PROVIDER_SITE_OTHER): Payer: Medicare HMO | Admitting: Psychiatry

## 2021-09-08 DIAGNOSIS — F411 Generalized anxiety disorder: Secondary | ICD-10-CM | POA: Diagnosis not present

## 2021-09-08 DIAGNOSIS — F319 Bipolar disorder, unspecified: Secondary | ICD-10-CM | POA: Diagnosis not present

## 2021-09-08 DIAGNOSIS — R69 Illness, unspecified: Secondary | ICD-10-CM | POA: Diagnosis not present

## 2021-09-08 MED ORDER — CITALOPRAM HYDROBROMIDE 40 MG PO TABS
ORAL_TABLET | Freq: Every day | ORAL | 3 refills | Status: DC
  Filled 2021-09-08: qty 30, 30d supply, fill #0
  Filled 2021-10-24: qty 30, 30d supply, fill #1
  Filled 2021-10-24: qty 30, 30d supply, fill #0
  Filled 2021-11-27: qty 30, 30d supply, fill #1
  Filled 2022-01-02 – 2022-01-09 (×2): qty 30, 30d supply, fill #2

## 2021-09-08 MED ORDER — HYDROXYZINE HCL 25 MG PO TABS
ORAL_TABLET | Freq: Three times a day (TID) | ORAL | 3 refills | Status: DC | PRN
  Filled 2021-09-08: qty 90, 30d supply, fill #0

## 2021-09-08 MED ORDER — MELATONIN 5 MG PO TABS
ORAL_TABLET | Freq: Every day | ORAL | 3 refills | Status: DC
  Filled 2021-09-08: qty 30, fill #0

## 2021-09-08 MED ORDER — CARIPRAZINE HCL 1.5 MG PO CAPS
ORAL_CAPSULE | ORAL | 3 refills | Status: DC
  Filled 2021-09-08: qty 90, 90d supply, fill #0
  Filled 2021-12-25 (×2): qty 30, 30d supply, fill #0

## 2021-09-08 MED ORDER — TRAZODONE HCL 50 MG PO TABS
ORAL_TABLET | Freq: Every day | ORAL | 3 refills | Status: DC
  Filled 2021-09-08: qty 30, 30d supply, fill #0
  Filled 2021-10-14: qty 30, 30d supply, fill #1
  Filled 2021-10-16: qty 30, 30d supply, fill #0
  Filled 2021-11-15: qty 30, 30d supply, fill #1
  Filled 2021-12-15: qty 30, 30d supply, fill #2

## 2021-09-08 NOTE — Progress Notes (Signed)
BH MD/PA/NP OP Progress Note Virtual Visit via Telephone Note  I connected with Amanda Larson on 09/08/21 at  9:00 AM EST by telephone and verified that I am speaking with the correct person using two identifiers.  Location: Patient: home Provider: Clinic   I discussed the limitations, risks, security and privacy concerns of performing an evaluation and management service by telephone and the availability of in person appointments. I also discussed with the patient that there may be a patient responsible charge related to this service. The patient expressed understanding and agreed to proceed.   I provided 30 minutes of non-face-to-face time during this encounter.   09/08/2021 9:04 AM Amanda Larson  MRN:  [id number]  Chief Complaint: "I haven't been able to take vraylar. I have medicare and I cant afford"    HPI: 55 year old female seen today for follow up psychiatric evaluation. She has a psychiatric history of Bipolar I disorder, depression, and anxiety. She is currently managed on Vraylar 1.5mg  daily, Celexa 40 mg daily, hydroxyzine 25 mg three times daily, Trazodone 50 mg nightly as needed, and melatonin 5mg  at night. She notes that her medications are effective in managing her psychiatric conditions.  Patient notes that she has not taken Vraylar in 1-2 weeks because her new insurance will not cover it.t   Today she was unable to login virtually so her assessment was done over the phone. During exam she was pleasant, cooperative, and engaged in conversation She informed that she continues to feel mentally stable without vraylar but notes that she would like to restart it. She denies symptoms of mania at this time. She also reports that her anxiety and depression are well managed.  Today provider conducted a GAD 7 and patient scored a 12, at her last visit she scored a 11.  Provider also conducted a PHQ 9 and patient scored an 4, at her last visit she scored an 3. She  endorses adequate sleep and appetite. She denies SI/HI/VAH, mania, or paranoia.  Provider informed patient that she could come to the clinic to get a 2 week sample of Vraylar. Nursing staff will work on prior authorization if needed to help patient get medications for a reasonable cost. She will continue  all medications as prescribed and will follow up with outpatient counseling for therapy.  No other concerns at this time.  Visit Diagnosis:    ICD-10-CM   1. Bipolar I disorder (HCC)  F31.9 cariprazine (VRAYLAR) 1.5 MG capsule    citalopram (CELEXA) 40 MG tablet    melatonin 5 MG TABS    traZODone (DESYREL) 50 MG tablet    2. Generalized anxiety disorder  F41.1 citalopram (CELEXA) 40 MG tablet    hydrOXYzine (ATARAX) 25 MG tablet      Past Psychiatric History: depression, anxiety, mood disorder, and PTSD.  Past Medical History:  Past Medical History:  Diagnosis Date   Bronchitis    Chronic back pain    Depression    Diabetes mellitus without complication (HCC)    Osteoarthritis     Past Surgical History:  Procedure Laterality Date   CESAREAN SECTION     CHOLECYSTECTOMY     TUBAL LIGATION      Family Psychiatric History: Daughter ADHD and Bipolar, Daughter ADHD and PTSD, Son ADHD, Grandson ADD and ADHD  Family History:  Family History  Problem Relation Age of Onset   Cancer Father        skin   Heart disease Paternal Grandfather  Stroke Paternal Grandfather    Drug abuse Paternal Grandfather    High Cholesterol Mother    Diabetes Maternal Aunt    Hypertension Maternal Aunt    Diabetes Maternal Uncle     Social History:  Social History   Socioeconomic History   Marital status: Significant Other    Spouse name: Not on file   Number of children: 3   Years of education: Not on file   Highest education level: Some college, no degree  Occupational History   Not on file  Tobacco Use   Smoking status: Former    Types: Cigarettes    Quit date: 07/31/2007     Years since quitting: 14.1   Smokeless tobacco: Never  Vaping Use   Vaping Use: Never used  Substance and Sexual Activity   Alcohol use: Yes    Comment: 3 drinks a year    Drug use: Yes    Types: Marijuana   Sexual activity: Yes    Birth control/protection: Surgical  Other Topics Concern   Not on file  Social History Narrative   Not on file   Social Determinants of Health   Financial Resource Strain: Not on file  Food Insecurity: Not on file  Transportation Needs: Not on file  Physical Activity: Not on file  Stress: Not on file  Social Connections: Not on file    Allergies:  Allergies  Allergen Reactions   Sulfa Antibiotics Anaphylaxis and Swelling   Clarithromycin Nausea Only    Metabolic Disorder Labs: Lab Results  Component Value Date   HGBA1C 6.4 07/31/2021   No results found for: PROLACTIN Lab Results  Component Value Date   CHOL 205 (H) 11/04/2020   TRIG 218 (H) 11/04/2020   HDL 35 (L) 11/04/2020   CHOLHDL 5.9 (H) 11/04/2020   LDLCALC 131 (H) 11/04/2020   LDLCALC 127 (H) 07/16/2019   No results found for: TSH  Therapeutic Level Labs: No results found for: LITHIUM No results found for: VALPROATE No components found for:  CBMZ  Current Medications: Current Outpatient Medications  Medication Sig Dispense Refill   albuterol (VENTOLIN HFA) 108 (90 Base) MCG/ACT inhaler Inhale 2 puffs into the lungs every 6 (six) hours as needed for wheezing or shortness of breath. 6.7 g 4   cariprazine (VRAYLAR) 1.5 MG capsule TAKE 1 CAPSULE (1.5 MG TOTAL) BY MOUTH DAILY. 30 capsule 3   citalopram (CELEXA) 40 MG tablet TAKE 1 TABLET (40 MG TOTAL) BY MOUTH DAILY. 30 tablet 3   Diclofenac Sodium 3 % GEL Apply 2 g topically in the morning, at noon, in the evening, and at bedtime. 100 g 2   Dulaglutide (TRULICITY) 3 MG/0.5ML SOPN Inject 3 mg as directed once a week. 0.5 mL 3   famotidine (PEPCID) 20 MG tablet Take 1 tablet (20 mg total) by mouth 2 (two) times daily. 60  tablet 2   gabapentin (NEURONTIN) 300 MG capsule TAKE 1 CAPSULE BY MOUTH THREE TIMES DAILY 90 capsule 0   hydrOXYzine (ATARAX) 25 MG tablet TAKE 1 TABLET (25 MG TOTAL) BY MOUTH 3 (THREE) TIMES DAILY AS NEEDED FOR ANXIETY. 90 tablet 3   melatonin 5 MG TABS TAKE 1 TABLET (5 MG TOTAL) BY MOUTH AT BEDTIME. 30 tablet 3   meloxicam (MOBIC) 15 MG tablet Take 1 tablet by mouth once daily 30 tablet 0   metFORMIN (GLUCOPHAGE-XR) 500 MG 24 hr tablet TAKE 3 TABLETS BY MOUTH ONCE DAILY WITH BREAKFAST 90 tablet 0   Omega-3 Fatty Acids (  FISH OIL) 1000 MG CAPS Take by mouth.     predniSONE (STERAPRED UNI-PAK 21 TAB) 5 MG (21) TBPK tablet Take dosepak as directed 21 tablet 0   simvastatin (ZOCOR) 40 MG tablet TAKE 1 TABLET BY MOUTH AT BEDTIME 30 tablet 0   traZODone (DESYREL) 50 MG tablet TAKE 1 TABLET (50 MG TOTAL) BY MOUTH AT BEDTIME. 30 tablet 3   triamcinolone ointment (KENALOG) 0.5 % Apply 1 application topically 2 (two) times daily. 30 g 0   No current facility-administered medications for this visit.     Musculoskeletal: Strength & Muscle Tone:  Unable to assess due to telephone visit Gait & Station: Unable to assess due to telephone visit Patient leans: N/A  Psychiatric Specialty Exam: Review of Systems  Last menstrual period 01/17/2014.There is no height or weight on file to calculate BMI.  General Appearance:  Unable to assess due to telephone visit  Eye Contact:   Unable to assess due to telephone visit  Speech:  Clear and Coherent and Normal Rate  Volume:  Normal  Mood:  Euthymic  Affect:  Appropriate and Congruent  Thought Process:  Coherent, Goal Directed and Linear  Orientation:  Full (Time, Place, and Person)  Thought Content: WDL and Logical   Suicidal Thoughts:  No  Homicidal Thoughts:  No  Memory:  Immediate;   Good Recent;   Good Remote;   Good  Judgement:  Good  Insight:  Good  Psychomotor Activity:  Normal  Concentration:  Concentration: Good and Attention Span: Good   Recall:  Good  Fund of Knowledge: Good  Language: Good  Akathisia:  No  Handed:  Right  AIMS (if indicated): Not done  Assets:  Communication Skills Desire for Improvement Financial Resources/Insurance Housing Social Support  ADL's:  Intact  Cognition: WNL  Sleep:  Good   Screenings: GAD-7    Flowsheet Row Video Visit from 09/08/2021 in [facility] Video Visit from 06/02/2021 in [facility] Video Visit from 03/03/2021 in [facility] Clinical Support from 11/14/2020 in [facility] Video Visit from 07/07/2020 in [facility]  Total GAD-7 Score PHQ2-9    Flowsheet Row Video Visit from 09/08/2021 in [facility] Office Visit from 07/31/2021 in [facility] Video Visit from 06/02/2021 in [facility] Video Visit from 03/03/2021 in [facility] Office Visit from 01/19/2021 in [facility]  PHQ-2 Total Score 2 2 0 2 4  PHQ-9 Total Score Flowsheet Row Clinical Support from 11/14/2020 in [facility]  C-SSRS RISK CATEGORY No Risk        Assessment and Plan: Patient notes that she has been doing well on her current medication regimen. She however notes that she has been without Vraylar for 1-2 weeks due to her insurance not covering it. Provider informed patient that she could come to the clinic to get a 2 week sample of Vraylar. Nursing staff will work on prior authorization if needed to help patient get medications for a reasonable cost. She will continue  all medications as prescribed  1. Bipolar I disorder (HCC)  Continue- cariprazine (VRAYLAR) 1.5 MG capsule; TAKE 1 CAPSULE (1.5 MG TOTAL) BY MOUTH DAILY.  Dispense: 30 capsule; Refill: 3 Continue- citalopram  (  CELEXA) 40 MG tablet; TAKE 1 TABLET (40 MG TOTAL) BY MOUTH DAILY.  Dispense: 30 tablet; Refill: 3 Continue- melatonin 5 MG TABS; TAKE 1 TABLET (5 MG TOTAL) BY MOUTH AT BEDTIME.  Dispense: 30 tablet; Refill: 3 Continue- traZODone (DESYREL) 50 MG tablet; TAKE 1 TABLET (50 MG TOTAL) BY MOUTH AT BEDTIME.  Dispense: 30 tablet; Refill: 3  2. Generalized anxiety disorder  Continue- citalopram (CELEXA) 40 MG tablet; TAKE 1 TABLET (40 MG TOTAL) BY MOUTH DAILY.  Dispense: 30 tablet; Refill: 3 Continue- hydrOXYzine (ATARAX) 25 MG tablet; TAKE 1 TABLET (25 MG TOTAL) BY MOUTH 3 (THREE) TIMES DAILY AS NEEDED FOR ANXIETY.  Dispense: 90 tablet; Refill: 3    Follow up in 3 months  Follow up with therapy  Shanna Cisco, NP 09/08/2021, 9:04 AM

## 2021-09-08 NOTE — Telephone Encounter (Signed)
Per Provider instruction patient given  2 Week Sample Vraylar 1.5 mg

## 2021-09-11 ENCOUNTER — Other Ambulatory Visit: Payer: Self-pay

## 2021-09-18 ENCOUNTER — Other Ambulatory Visit: Payer: Self-pay

## 2021-09-18 ENCOUNTER — Ambulatory Visit: Payer: Medicare HMO | Admitting: Orthopedic Surgery

## 2021-09-18 ENCOUNTER — Ambulatory Visit: Payer: Medicare HMO | Admitting: Surgical

## 2021-09-18 ENCOUNTER — Encounter: Payer: Self-pay | Admitting: Surgical

## 2021-09-18 DIAGNOSIS — M79605 Pain in left leg: Secondary | ICD-10-CM

## 2021-09-18 NOTE — Progress Notes (Signed)
Office Visit Note   Patient: Amanda Larson           Date of Birth: [birth year]           MRN: [id number] Visit Date: 09/18/2021 Requested by: Katha Cabal, DO [address] [place],  [place] [address] PCP: Katha Cabal, DO  Subjective: Chief Complaint  Patient presents with   Left Leg - Pain    HPI: Amanda Larson is a 57 y.o. female who presents to the office complaining of back and leg pain.  Patient complains of continued low back pain with radiation to both buttocks.  Left buttocks radicular pain bothers her more than the right.  Has occasional shooting pains down to her big toe but only notices this once or twice a day or as she is continuously noticing the other pains.  She had good relief with steroid Dosepak but after she stopped the Dosepak her symptoms quickly returned.  She also notices more groin pain since the last visit.  However, the back and buttocks pain is bothering her more than the groin pain is.              ROS: All systems reviewed are negative as they relate to the chief complaint within the history of present illness.  Patient denies fevers or chills.  Assessment & Plan: Visit Diagnoses:  1. Pain in left leg     Plan: Patient is a 56 year old female who presents for evaluation primarily of low back pain with radiation to the buttocks bilaterally.  She has had continued symptoms with no long-term resolution she did have short-term relief from the steroid Dosepak.  Additionally, it seems the left hip arthritis that was not bothering her as much last time is more symptomatic today on exam but the main complaint she has still seems related to her low back.  Plan to order MRI of the lumbar spine for further evaluation.  Also discussed trying a left hip injection for diagnostic and therapeutic purposes to evaluate what percentage of her pain is coming from her left hip arthritis.  She wants to hold off on that injection for now.  Follow-up after MRI to review  results.  Follow-Up Instructions: No follow-ups on file.   Orders:  Orders Placed This Encounter  Procedures   MR Lumbar Spine w/o contrast   No orders of the defined types were placed in this encounter.     Procedures: No procedures performed   Clinical Data: No additional findings.  Objective: Vital Signs: LMP 01/17/2014   Physical Exam:  Constitutional: Patient appears well-developed HEENT:  Head: Normocephalic Eyes:EOM are normal Neck: Normal range of motion Cardiovascular: Normal rate Pulmonary/chest: Effort normal Neurologic: Patient is alert Skin: Skin is warm Psychiatric: Patient has normal mood and affect  Ortho Exam: Ortho exam demonstrates 5 -/5 left hip flexion strength.  5/5 right hip flexion, bilateral quadricep, hamstring, dorsiflexion, plantarflexion strength.  Positive straight leg raise on left, negative on right.  Positive Stinchfield sign on left, negative on right.  No pain with hip range of motion on the right side.  Increased pain with left hip range of motion that localizes to the groin.  Tenderness throughout the axial lumbar spine.  Specialty Comments:  No specialty comments available.  Imaging: No results found.   PMFS History: Patient Active Problem List   Diagnosis Date Noted   Bipolar I disorder (HCC) 03/30/2020   Recurrent major depressive disorder, in partial remission (HCC) 03/30/2020   Generalized  anxiety disorder 03/30/2020   Obstructive sleep apnea 11/21/2018   Osteoarthritis of left knee 05/15/2018   Gastro-esophageal reflux 05/15/2018   Hyperlipidemia 12/10/2017   Type 2 diabetes mellitus with hyperglycemia (HCC) 11/18/2017   Depression 02/08/2017   Chronic low back pain 02/08/2017   Asthma 02/08/2017   Psoriasis 02/08/2017   Healthcare maintenance 02/08/2017   Past Medical History:  Diagnosis Date   Bronchitis    Chronic back pain    Depression    Diabetes mellitus without complication (HCC)    Osteoarthritis      Family History  Problem Relation Age of Onset   Cancer Father        skin   Heart disease Paternal Grandfather    Stroke Paternal Grandfather    Drug abuse Paternal Grandfather    High Cholesterol Mother    Diabetes Maternal Aunt    Hypertension Maternal Aunt    Diabetes Maternal Uncle     Past Surgical History:  Procedure Laterality Date   CESAREAN SECTION     CHOLECYSTECTOMY     TUBAL LIGATION     Social History   Occupational History   Not on file  Tobacco Use   Smoking status: Former    Types: Cigarettes    Quit date: 07/31/2007    Years since quitting: 14.1   Smokeless tobacco: Never  Vaping Use   Vaping Use: Never used  Substance and Sexual Activity   Alcohol use: Yes    Comment: 3 drinks a year    Drug use: Yes    Types: Marijuana   Sexual activity: Yes    Birth control/protection: Surgical

## 2021-09-20 ENCOUNTER — Other Ambulatory Visit: Payer: Self-pay | Admitting: Family Medicine

## 2021-09-20 DIAGNOSIS — G8929 Other chronic pain: Secondary | ICD-10-CM

## 2021-09-20 DIAGNOSIS — M1712 Unilateral primary osteoarthritis, left knee: Secondary | ICD-10-CM

## 2021-09-20 DIAGNOSIS — M5442 Lumbago with sciatica, left side: Secondary | ICD-10-CM

## 2021-10-06 ENCOUNTER — Other Ambulatory Visit: Payer: Self-pay | Admitting: Family Medicine

## 2021-10-06 DIAGNOSIS — M545 Low back pain, unspecified: Secondary | ICD-10-CM

## 2021-10-06 DIAGNOSIS — E119 Type 2 diabetes mellitus without complications: Secondary | ICD-10-CM

## 2021-10-06 DIAGNOSIS — E785 Hyperlipidemia, unspecified: Secondary | ICD-10-CM

## 2021-10-11 ENCOUNTER — Other Ambulatory Visit: Payer: Self-pay

## 2021-10-11 ENCOUNTER — Ambulatory Visit
Admission: RE | Admit: 2021-10-11 | Discharge: 2021-10-11 | Disposition: A | Discharge status: Home / Self Care | Payer: Medicare HMO | Source: Ambulatory Visit | Attending: Surgical | Admitting: Surgical

## 2021-10-11 DIAGNOSIS — M79605 Pain in left leg: Secondary | ICD-10-CM

## 2021-10-11 DIAGNOSIS — M545 Low back pain, unspecified: Secondary | ICD-10-CM | POA: Diagnosis not present

## 2021-10-16 ENCOUNTER — Other Ambulatory Visit: Payer: Self-pay

## 2021-10-25 ENCOUNTER — Other Ambulatory Visit: Payer: Self-pay

## 2021-10-26 ENCOUNTER — Other Ambulatory Visit: Payer: Self-pay

## 2021-11-01 ENCOUNTER — Encounter: Payer: Self-pay | Admitting: Family Medicine

## 2021-11-01 ENCOUNTER — Ambulatory Visit (INDEPENDENT_AMBULATORY_CARE_PROVIDER_SITE_OTHER): Payer: Medicare HMO | Admitting: Family Medicine

## 2021-11-01 ENCOUNTER — Other Ambulatory Visit: Payer: Self-pay

## 2021-11-01 VITALS — BP 128/66 | HR 89 | Wt 309.4 lb

## 2021-11-01 DIAGNOSIS — M545 Low back pain, unspecified: Secondary | ICD-10-CM | POA: Diagnosis not present

## 2021-11-01 DIAGNOSIS — G8929 Other chronic pain: Secondary | ICD-10-CM

## 2021-11-01 DIAGNOSIS — E1165 Type 2 diabetes mellitus with hyperglycemia: Secondary | ICD-10-CM

## 2021-11-01 DIAGNOSIS — M5440 Lumbago with sciatica, unspecified side: Secondary | ICD-10-CM

## 2021-11-01 LAB — POCT GLYCOSYLATED HEMOGLOBIN (HGB A1C): HbA1c, POC (controlled diabetic range): 6.9 % (ref 0.0–7.0)

## 2021-11-01 MED ORDER — TRAMADOL HCL 50 MG PO TABS: 50.0000 mg | ORAL_TABLET | Freq: Four times a day (QID) | ORAL | 0 refills | Status: DC | PRN

## 2021-11-01 MED ORDER — KETOROLAC TROMETHAMINE 15 MG/ML IJ SOLN: 15.0000 mg | Freq: Once | INTRAMUSCULAR | Status: DC

## 2021-11-01 NOTE — Patient Instructions (Addendum)
It was great seeing you today!  Please check-out at the front desk before leaving the clinic. I'd like to see you back in 3 months but if you need to be seen earlier than that for any new issues we're happy to fit you in, just give [place] a call!  Visit Remembers: - Stop by the pharmacy to pick up your prescriptions  - Continue to work on your healthy eating habits and incorporating exercise into your daily life.  - Your goal is to have an BP < 120/80 - Medicine Changes: Use Tramadol as needed for severe pain.  - A referral to physical therapy was placed be call us in 2 weeks if you have not been contacted about an appointment. -Be sure to follow up with Dr. Diamantina Providence office to discuss next steps with your MRI   Please bring all of your medications with you to each visit.   Feel free to call with any questions or concerns at any time, at [phone].   Take care,  Dr. Katherina Right [facility]

## 2021-11-01 NOTE — Progress Notes (Signed)
° °  SUBJECTIVE:   CHIEF COMPLAINT / HPI:   Chief Complaint  Patient presents with   Results     Amanda Larson is a 56 y.o. female here for to discuss results of her MRI.    MRI results Pt seen by Dr August Saucer at [facility] for left leg pain and chronic low back pain. Taking Meloxicam for pain and using Voltaren gel without relief.  She completed a steroid Dosepak.  Pain worse recently.  She would like to discuss the results of her MRI.  Reports she is unable to sit for very long without significant pain.  Diabetes Mellitus  Trulicity every Monday. Reports taking 3 a day. Denies missing any doses of DM medications.  Denies increased thirst, hunger, or frequent urination. Home blood surgars 128 recently; ranging 165 - 117.   Blood pressure elevated  Takes nothing for her blood pressure.      PERTINENT  PMH / PSH: reviewed and updated as appropriate   OBJECTIVE:   BP 128/66    Pulse 89    Wt (!) 309 lb 6.4 oz (140.3 kg)    LMP 01/17/2014    SpO2 95%    BMI 58.46 kg/m    GEN: Uncomfortable well appearing obese female CV: regular rate and rhythm RESP: no increased work of breathing, clear to ascultation bilaterally MSK: Shifting in chair and occasionally standing, antalgic gait, strength 5/5 bilateral lower extremity, lumbar spine midline and paraspinal tenderness extending to the gluts SKIN: warm, dry, psoriasis patches diffusely    ASSESSMENT/PLAN:   Chronic low back pain Uncontrolled.  Patient seen by Dr. August Saucer at [facility] orthopedics.  Reviewed MRI results with patient.  No spinal stenosis seen.  Moderate lumbar arthritis.  Mild wedging of T10 and T 11 with T11/T12 small disc bulge.  Advised patient to follow-up with Dr. Diamantina Providence office.  We will provide short course of tramadol for acute pain.  Referral for physical therapy placed.  Type 2 diabetes mellitus with hyperglycemia (HCC) Received notification from patient's insurance company that she is not refilling her  metformin regularly.  Patient denies missing any doses of her metformin.  Continue 1500 mg metformin XR daily.  A1c today 6.9 and previously 6.4.  The increase is possibly due to her change in diet after her family members moved in with her possibly medication nonadherence.   History of elevated blood pressure Elevated BP at her last visit. BP today at goal.   Katha Cabal, DO PGY-3, [facility] 11/02/2021

## 2021-11-02 NOTE — Assessment & Plan Note (Addendum)
Uncontrolled.  Patient seen by Dr. August Saucer at [facility] orthopedics.  Reviewed MRI results with patient.  No spinal stenosis seen.  Moderate lumbar arthritis.  Mild wedging of T10 and T 11 with T11/T12 small disc bulge.  Advised patient to follow-up with Dr. Diamantina Providence office.  We will provide short course of tramadol for acute pain.  Referral for physical therapy placed.

## 2021-11-02 NOTE — Assessment & Plan Note (Signed)
Received notification from patient's insurance company that she is not refilling her metformin regularly.  Patient denies missing any doses of her metformin.  Continue 1500 mg metformin XR daily.  A1c today 6.9 and previously 6.4.  The increase is possibly due to her change in diet after her family members moved in with her possibly medication nonadherence.

## 2021-11-06 ENCOUNTER — Other Ambulatory Visit: Payer: Self-pay | Admitting: Family Medicine

## 2021-11-06 DIAGNOSIS — G8929 Other chronic pain: Secondary | ICD-10-CM

## 2021-11-06 DIAGNOSIS — M5442 Lumbago with sciatica, left side: Secondary | ICD-10-CM

## 2021-11-06 DIAGNOSIS — E119 Type 2 diabetes mellitus without complications: Secondary | ICD-10-CM

## 2021-11-06 DIAGNOSIS — M1712 Unilateral primary osteoarthritis, left knee: Secondary | ICD-10-CM

## 2021-11-06 DIAGNOSIS — E785 Hyperlipidemia, unspecified: Secondary | ICD-10-CM

## 2021-11-07 NOTE — Progress Notes (Signed)
Need follow-up appointment with me or dr dean, looks like she doesn't have one

## 2021-11-08 ENCOUNTER — Telehealth: Payer: Self-pay | Admitting: Orthopedic Surgery

## 2021-11-08 NOTE — Telephone Encounter (Signed)
called 2x, LMOM for pt to return call to sch a follow up appt with Marlou Sa or Lurena Joiner ?

## 2021-11-15 ENCOUNTER — Other Ambulatory Visit: Payer: Self-pay

## 2021-11-15 ENCOUNTER — Other Ambulatory Visit (HOSPITAL_COMMUNITY): Payer: Self-pay

## 2021-11-17 ENCOUNTER — Other Ambulatory Visit: Payer: Self-pay

## 2021-11-18 ENCOUNTER — Other Ambulatory Visit: Payer: Self-pay | Admitting: Family Medicine

## 2021-11-21 ENCOUNTER — Encounter (HOSPITAL_COMMUNITY): Payer: Self-pay

## 2021-11-21 ENCOUNTER — Telehealth (HOSPITAL_COMMUNITY): Payer: Medicare HMO | Admitting: Psychiatry

## 2021-11-23 ENCOUNTER — Other Ambulatory Visit: Payer: Self-pay | Admitting: Family Medicine

## 2021-11-24 ENCOUNTER — Telehealth (HOSPITAL_COMMUNITY): Payer: Medicare HMO | Admitting: Psychiatry

## 2021-11-27 ENCOUNTER — Other Ambulatory Visit: Payer: Self-pay

## 2021-11-28 ENCOUNTER — Other Ambulatory Visit: Payer: Self-pay

## 2021-12-06 ENCOUNTER — Other Ambulatory Visit: Payer: Self-pay | Admitting: Family Medicine

## 2021-12-06 DIAGNOSIS — E785 Hyperlipidemia, unspecified: Secondary | ICD-10-CM

## 2021-12-06 DIAGNOSIS — E119 Type 2 diabetes mellitus without complications: Secondary | ICD-10-CM

## 2021-12-08 ENCOUNTER — Other Ambulatory Visit: Payer: Self-pay | Admitting: Family Medicine

## 2021-12-08 DIAGNOSIS — M545 Low back pain, unspecified: Secondary | ICD-10-CM

## 2021-12-11 ENCOUNTER — Telehealth (HOSPITAL_COMMUNITY): Payer: Medicare HMO | Admitting: Psychiatry

## 2021-12-11 ENCOUNTER — Other Ambulatory Visit: Payer: Self-pay | Admitting: Family Medicine

## 2021-12-11 DIAGNOSIS — M545 Low back pain, unspecified: Secondary | ICD-10-CM

## 2021-12-15 ENCOUNTER — Other Ambulatory Visit: Payer: Self-pay

## 2021-12-20 ENCOUNTER — Other Ambulatory Visit: Payer: Self-pay

## 2021-12-20 ENCOUNTER — Other Ambulatory Visit: Payer: Self-pay | Admitting: Family Medicine

## 2021-12-20 DIAGNOSIS — M545 Low back pain, unspecified: Secondary | ICD-10-CM

## 2021-12-20 DIAGNOSIS — M1712 Unilateral primary osteoarthritis, left knee: Secondary | ICD-10-CM

## 2021-12-20 DIAGNOSIS — G8929 Other chronic pain: Secondary | ICD-10-CM

## 2021-12-21 ENCOUNTER — Other Ambulatory Visit: Payer: Self-pay

## 2021-12-21 MED ORDER — METFORMIN HCL ER 500 MG PO TB24: ORAL_TABLET | ORAL | 1 refills | Status: DC

## 2021-12-25 ENCOUNTER — Other Ambulatory Visit: Payer: Self-pay

## 2021-12-26 ENCOUNTER — Ambulatory Visit (INDEPENDENT_AMBULATORY_CARE_PROVIDER_SITE_OTHER): Payer: Medicare HMO | Admitting: Psychiatry

## 2021-12-26 ENCOUNTER — Other Ambulatory Visit: Payer: Self-pay

## 2021-12-26 DIAGNOSIS — F411 Generalized anxiety disorder: Secondary | ICD-10-CM

## 2021-12-26 DIAGNOSIS — F319 Bipolar disorder, unspecified: Secondary | ICD-10-CM

## 2021-12-26 DIAGNOSIS — R69 Illness, unspecified: Secondary | ICD-10-CM | POA: Diagnosis not present

## 2021-12-26 MED ORDER — TRAZODONE HCL 50 MG PO TABS
ORAL_TABLET | Freq: Every day | ORAL | 1 refills | Status: DC
  Filled 2021-12-26: qty 30, fill #0
  Filled 2022-01-19: qty 30, 30d supply, fill #0
  Filled 2022-02-12: qty 30, 30d supply, fill #1

## 2021-12-26 MED ORDER — CARIPRAZINE HCL 1.5 MG PO CAPS
ORAL_CAPSULE | ORAL | 1 refills | Status: DC
  Filled 2021-12-26: qty 30, 30d supply, fill #0

## 2021-12-26 MED ORDER — HYDROXYZINE HCL 25 MG PO TABS
ORAL_TABLET | Freq: Three times a day (TID) | ORAL | 1 refills | Status: DC | PRN
  Filled 2021-12-26 – 2022-01-25 (×2): qty 90, 30d supply, fill #0

## 2021-12-26 MED ORDER — MELATONIN 5 MG PO TABS
ORAL_TABLET | Freq: Every day | ORAL | 1 refills | Status: DC
  Filled 2021-12-26: qty 30, fill #0

## 2021-12-26 NOTE — Progress Notes (Signed)
BH MD/PA/NP OP Progress Note ? ?12/26/2021 1:21 PM ?Amanda Larson  ?MRN:  [id number] ? ?Virtual Visit via Telephone Note ? ?I connected with Amanda Larson on 12/26/21 at  9:30 AM EDT by telephone and verified that I am speaking with the correct person using two identifiers. ? ?Location: ?Patient: home ?Provider: offsite ?  ?I discussed the limitations, risks, security and privacy concerns of performing an evaluation and management service by telephone and the availability of in person appointments. I also discussed with the patient that there may be a patient responsible charge related to this service. The patient expressed understanding and agreed to proceed. ? ? ? ?  ?I discussed the assessment and treatment plan with the patient. The patient was provided an opportunity to ask questions and all were answered. The patient agreed with the plan and demonstrated an understanding of the instructions. ?  ?The patient was advised to call back or seek an in-person evaluation if the symptoms worsen or if the condition fails to improve as anticipated. ? ?I provided 10 minutes of non-face-to-face time during this encounter. ? ? ?Mcneil Sober, NP  ? ?Chief Complaint: Medication management ? ?HPI: Amanda Larson is a 56 year old female presented to [facility] behavioral health outpatient for follow-up psychiatric evaluation.  Patient has a psychiatric history of bipolar disorder, major depressive disorder and generalized anxiety disorder.  Her symptoms are managed with trazodone 50 mg at bedtime, melatonin 5 mg at bedtime, hydroxyzine 25 mg 3 times daily as needed for anxiety, Celexa 40 mg daily and Vraylar 1.5 mg daily.  Patient reports that her medications are effective with managing her symptoms and denies adverse effects.  No medication changes today. ? ? ?Visit Diagnosis: No diagnosis found. ? ?Past Psychiatric History: Generalized anxiety disorder, major depressive disorder and bipolar disorder ? ?Past Medical  History:  ?Past Medical History:  ?Diagnosis Date  ? Bronchitis   ? Chronic back pain   ? Depression   ? Diabetes mellitus without complication (HCC)   ? Osteoarthritis   ?  ?Past Surgical History:  ?Procedure Laterality Date  ? CESAREAN SECTION    ? CHOLECYSTECTOMY    ? TUBAL LIGATION    ? ? ?Family Psychiatric History: N/A ? ?Family History:  ?Family History  ?Problem Relation Age of Onset  ? Cancer Father   ?     skin  ? Heart disease Paternal Grandfather   ? Stroke Paternal Grandfather   ? Drug abuse Paternal Grandfather   ? High Cholesterol Mother   ? Diabetes Maternal Aunt   ? Hypertension Maternal Aunt   ? Diabetes Maternal Uncle   ? ? ?Social History:  ?Social History  ? ?Socioeconomic History  ? Marital status: Significant Other  ?  Spouse name: Not on file  ? Number of children: 3  ? Years of education: Not on file  ? Highest education level: Some college, no degree  ?Occupational History  ? Not on file  ?Tobacco Use  ? Smoking status: Former  ?  Types: Cigarettes  ?  Quit date: 07/31/2007  ?  Years since quitting: 14.4  ? Smokeless tobacco: Never  ?Vaping Use  ? Vaping Use: Never used  ?Substance and Sexual Activity  ? Alcohol use: Yes  ?  Comment: 3 drinks a year   ? Drug use: Yes  ?  Types: Marijuana  ? Sexual activity: Yes  ?  Birth control/protection: Surgical  ?Other Topics Concern  ? Not on file  ?Social History  Narrative  ? Not on file  ? ?Social Determinants of Health  ? ?Financial Resource Strain: Not on file  ?Food Insecurity: Not on file  ?Transportation Needs: Not on file  ?Physical Activity: Not on file  ?Stress: Not on file  ?Social Connections: Not on file  ? ? ?Allergies:  ?Allergies  ?Allergen Reactions  ? Sulfa Antibiotics Anaphylaxis and Swelling  ? Clarithromycin Nausea Only  ? ? ?Metabolic Disorder Labs: ?Lab Results  ?Component Value Date  ? HGBA1C 6.9 11/01/2021  ? ?No results found for: PROLACTIN ?Lab Results  ?Component Value Date  ? CHOL 205 (H) 11/04/2020  ? TRIG 218 (H)  11/04/2020  ? HDL 35 (L) 11/04/2020  ? CHOLHDL 5.9 (H) 11/04/2020  ? LDLCALC 131 (H) 11/04/2020  ? LDLCALC 127 (H) 07/16/2019  ? ?No results found for: TSH ? ?Therapeutic Level Labs: ?No results found for: LITHIUM ?No results found for: VALPROATE ?No components found for:  CBMZ ? ?Current Medications: ?Current Outpatient Medications  ?Medication Sig Dispense Refill  ? albuterol (VENTOLIN HFA) 108 (90 Base) MCG/ACT inhaler Inhale 2 puffs into the lungs every 6 (six) hours as needed for wheezing or shortness of breath. 6.7 g 4  ? cariprazine (VRAYLAR) 1.5 MG capsule TAKE 1 CAPSULE (1.5 MG TOTAL) BY MOUTH DAILY. 30 capsule 3  ? citalopram (CELEXA) 40 MG tablet TAKE 1 TABLET (40 MG TOTAL) BY MOUTH DAILY. 30 tablet 3  ? Diclofenac Sodium 3 % GEL Apply 2 g topically in the morning, at noon, in the evening, and at bedtime. 100 g 2  ? Dulaglutide (TRULICITY) 3 MG/0.5ML SOPN Inject 3 mg as directed once a week. 0.5 mL 3  ? famotidine (PEPCID) 20 MG tablet Take 1 tablet (20 mg total) by mouth 2 (two) times daily. 60 tablet 2  ? gabapentin (NEURONTIN) 300 MG capsule TAKE 1 CAPSULE BY MOUTH THREE TIMES DAILY 90 capsule 0  ? hydrOXYzine (ATARAX) 25 MG tablet TAKE 1 TABLET (25 MG TOTAL) BY MOUTH 3 (THREE) TIMES DAILY AS NEEDED FOR ANXIETY. 90 tablet 3  ? melatonin 5 MG TABS TAKE 1 TABLET (5 MG TOTAL) BY MOUTH AT BEDTIME. 30 tablet 3  ? meloxicam (MOBIC) 15 MG tablet Take 1 tablet by mouth once daily 30 tablet 0  ? metFORMIN (GLUCOPHAGE-XR) 500 MG 24 hr tablet TAKE 3 TABLETS BY MOUTH ONCE DAILY WITH BREAKFAST 90 tablet 1  ? Omega-3 Fatty Acids (FISH OIL) 1000 MG CAPS Take by mouth.    ? predniSONE (STERAPRED UNI-PAK 21 TAB) 5 MG (21) TBPK tablet Take dosepak as directed 21 tablet 0  ? simvastatin (ZOCOR) 40 MG tablet TAKE 1 TABLET BY MOUTH AT BEDTIME 30 tablet 0  ? traMADol (ULTRAM) 50 MG tablet TAKE 1 TABLET BY MOUTH EVERY 6 HOURS AS NEEDED FOR UP TO 5 DAYS. 20 tablet 0  ? traZODone (DESYREL) 50 MG tablet TAKE 1 TABLET (50 MG  TOTAL) BY MOUTH AT BEDTIME. 30 tablet 3  ? triamcinolone ointment (KENALOG) 0.5 % Apply 1 application topically 2 (two) times daily. 30 g 0  ? ?No current facility-administered medications for this visit.  ? ? ? ?Musculoskeletal: ?Strength & Muscle Tone: N/A virtual visit ?Gait & Station: N/A virtual visit ?Patient leans: N/A ? ?Psychiatric Specialty Exam: ?Review of Systems  ?Psychiatric/Behavioral:  Negative for hallucinations, self-injury and suicidal ideas.   ?All other systems reviewed and are negative.  ?Last menstrual period 01/17/2014.There is no height or weight on file to calculate BMI.  ?General Appearance: NA  ?  Eye Contact:  NA  ?Speech:  Clear and Coherent  ?Volume:  Normal  ?Mood:  Euthymic  ?Affect: N/A  ?Thought Process:  Goal Directed  ?Orientation:  Full (Time, Place, and Person)  ?Thought Content: Logical   ?Suicidal Thoughts:  No  ?Homicidal Thoughts:  No  ?Memory: Good  ?Judgement:  Good  ?Insight:  Good  ?Psychomotor Activity:  NA  ?Concentration: Good  ?Recall:  Good  ?Fund of Knowledge: Good  ?Language: Good  ?Akathisia: N/A  ?Handed:  Right  ?AIMS (if indicated): not done  ?Assets:  Communication Skills ?Desire for Improvement  ?ADL's:  Intact  ?Cognition: WNL  ?Sleep:  Good  ? ?Screenings: ?GAD-7   ? ?Flowsheet Row [facility]20[facility]  ?Total GAD-7 Score 12 11 9 10 10   ? ?  ? ?PHQ2-9   ? ?Flowsheet Row [facility]20[facility]2021 [facility]  ?PHQ-2 Total Score 2 2 2  0 2  ?PHQ-9 Total Score 8 4 8 3 7   ? ?  ? ?Flowsheet Row Clinical Support [facility] [facility]

## 2021-12-27 ENCOUNTER — Other Ambulatory Visit: Payer: Self-pay

## 2021-12-28 ENCOUNTER — Other Ambulatory Visit: Payer: Self-pay

## 2022-01-02 ENCOUNTER — Other Ambulatory Visit: Payer: Self-pay

## 2022-01-04 ENCOUNTER — Other Ambulatory Visit: Payer: Self-pay

## 2022-01-04 DIAGNOSIS — E119 Type 2 diabetes mellitus without complications: Secondary | ICD-10-CM

## 2022-01-04 DIAGNOSIS — E785 Hyperlipidemia, unspecified: Secondary | ICD-10-CM

## 2022-01-05 MED ORDER — SIMVASTATIN 40 MG PO TABS: 40.0000 mg | ORAL_TABLET | Freq: Every day | ORAL | 2 refills | Status: DC

## 2022-01-08 ENCOUNTER — Other Ambulatory Visit: Payer: Self-pay | Admitting: Family Medicine

## 2022-01-08 DIAGNOSIS — M545 Low back pain, unspecified: Secondary | ICD-10-CM

## 2022-01-09 ENCOUNTER — Other Ambulatory Visit: Payer: Self-pay

## 2022-01-15 ENCOUNTER — Other Ambulatory Visit: Payer: Self-pay | Admitting: Family Medicine

## 2022-01-15 DIAGNOSIS — M545 Low back pain, unspecified: Secondary | ICD-10-CM

## 2022-01-19 ENCOUNTER — Other Ambulatory Visit: Payer: Self-pay

## 2022-01-24 ENCOUNTER — Other Ambulatory Visit: Payer: Self-pay | Admitting: Family Medicine

## 2022-01-24 DIAGNOSIS — G8929 Other chronic pain: Secondary | ICD-10-CM

## 2022-01-25 ENCOUNTER — Ambulatory Visit (INDEPENDENT_AMBULATORY_CARE_PROVIDER_SITE_OTHER): Payer: Medicare HMO | Admitting: Family Medicine

## 2022-01-25 ENCOUNTER — Encounter: Payer: Self-pay | Admitting: Family Medicine

## 2022-01-25 ENCOUNTER — Other Ambulatory Visit: Payer: Self-pay

## 2022-01-25 VITALS — BP 122/70 | HR 83 | Ht 61.0 in | Wt 308.0 lb

## 2022-01-25 DIAGNOSIS — E1149 Type 2 diabetes mellitus with other diabetic neurological complication: Secondary | ICD-10-CM

## 2022-01-25 DIAGNOSIS — G8929 Other chronic pain: Secondary | ICD-10-CM | POA: Diagnosis not present

## 2022-01-25 DIAGNOSIS — M545 Low back pain, unspecified: Secondary | ICD-10-CM

## 2022-01-25 DIAGNOSIS — M5440 Lumbago with sciatica, unspecified side: Secondary | ICD-10-CM

## 2022-01-25 MED ORDER — TRAMADOL HCL 50 MG PO TABS: 50.0000 mg | ORAL_TABLET | Freq: Every day | ORAL | 0 refills | Status: DC

## 2022-01-25 MED ORDER — PREGABALIN 100 MG PO CAPS: 100.0000 mg | ORAL_CAPSULE | Freq: Two times a day (BID) | ORAL | 0 refills | Status: DC

## 2022-01-25 NOTE — Progress Notes (Signed)
   SUBJECTIVE:   CHIEF COMPLAINT / HPI:   Chief Complaint  Patient presents with   tramadol refill     Amanda Larson is a 56 y.o. female here for   Chronic low back pain  Patient continues to have low back pain.  Denies recent falls or knee buckling.  Mobic is not helping and she ran out of her tramadol.  Request tramadol refill.  Uses her walking cane when she is out and about.  Taking meloxicam.  Pain worse with sitting for long periods of time.    Neuropathy Her neuropathy is keeping her from sleeping well.  Patient states she wants is tried Lyrica.  She took one of her daughters Lyrica and it helped significantly.  Feels like gabapentin does not help as much as it did previously.   PERTINENT  PMH / PSH: reviewed and updated as appropriate   OBJECTIVE:   BP 122/70   Pulse 83   Ht 5\' 1"  (1.549 m)   Wt (!) 308 lb (139.7 kg)   LMP 01/17/2014   SpO2 92%   BMI 58.20 kg/m    GEN: well appearing female in no acute distress  CVS: well perfused  RESP: speaking in full sentences without pause, no respiratory distress  MSK: Lumbar spine: walking cane present in exam room  - Inspection: no gross deformity or asymmetry, swelling or ecchymosis. No skin changes - Palpation: Mild TTP over the L3-5 spinous processes, moderate paraspinal muscles, no SI joints tenderness b/l - ROM: limited flexion and extension due to pain - Strength: 5/5 strength of lower extremity in L4-S1 nerve root distributions b/l - Neuro: sensation intact in the L4-S1 nerve root distribution b/l, reflexes not assessed today SKIN: Warm and dry, psoriasis patches visible     ASSESSMENT/PLAN:   Chronic low back pain Uncontrolled.  Patient previously previously seen by Dr. 03/19/2014 at [facility] orthopedics.  Reviewed Lumbar MRI which showed Moderate to severe facet arthropathy and mild wedging of T10 and T11, with small disc bulge and epidural lipomatosis.  Recommened patient to follow-up with Dr. [facility] office.  She is not interested in surgery at this time. Refilled Tramadol 50 mg daily PRN for severe pain. PDMP reviewed. Continue Mobic.    Type 2 diabetes mellitus with neurological complications (HCC) Diabetes is controlled with Trulicity and metformin. Has numbness and tingling in both feet. She would like to trial Lyrica instead of Gabapentin. Discussed these were similar medication but she reports improvement with Lyrica.  Discontinue gabapentin and start Lyrica 100 mg twice daily.    Diamantina Providence, DO PGY-3, [facility] 01/25/2022

## 2022-01-25 NOTE — Patient Instructions (Addendum)
We stopped the Gabapentin today and switched to Lyrica. Stop by the pharmacy to pick up your prescriptions. Follow up with Dr Diamantina Providence office, your orthopedic doctor.   Follow up in 3 months.

## 2022-01-25 NOTE — Assessment & Plan Note (Addendum)
Uncontrolled.  Patient previously previously seen by Dr. August Saucer at [facility] orthopedics.  Reviewed Lumbar MRI which showed Moderate to severe facet arthropathy and mild wedging of T10 and T11, with small disc bulge and epidural lipomatosis.  Recommened patient to follow-up with Dr. Diamantina Providence office. She is not interested in surgery at this time. Refilled Tramadol 50 mg daily PRN for severe pain. PDMP reviewed. Continue Mobic.

## 2022-01-25 NOTE — Assessment & Plan Note (Signed)
Diabetes is controlled with Trulicity and metformin. Has numbness and tingling in both feet. She would like to trial Lyrica instead of Gabapentin. Discussed these were similar medication but she reports improvement with Lyrica.  Discontinue gabapentin and start Lyrica 100 mg twice daily.

## 2022-01-30 ENCOUNTER — Telehealth: Payer: Self-pay

## 2022-01-30 ENCOUNTER — Other Ambulatory Visit (HOSPITAL_COMMUNITY): Payer: Self-pay

## 2022-01-30 NOTE — Telephone Encounter (Signed)
Denial letter scanned to chart.

## 2022-01-30 NOTE — Telephone Encounter (Signed)
Prior Auth for patients medication PREGABALIN (LYRICA) denied by [facility] MEDICARE/[employer] via CoverMyMeds.   Reason: Plan does not allow coverage of this medication based on your prescriber answering No to the following question(s):  Is the requested drug being prescribed as adjunctive therapy for treatment of partial onset seizures?   Is the requested drug being prescribed for the management of fibromyalgia or the management of neuropathic pain associated with spinal cord injury?   Is the requested drug being prescribed for any of the following: A) Management of postherpetic neuralgia, B) Management of neuropathic pain associated with diabetic peripheral neuropathy, C) Cancer-related neuropathic pain, D) Cancer treatment-related neuropathic pain?  CoverMyMeds Key: [id number]

## 2022-01-30 NOTE — Telephone Encounter (Signed)
A Prior Authorization was initiated for this patients PREGABALIN (LYRICA) through CoverMyMeds.   Key: [id number]

## 2022-01-31 ENCOUNTER — Telehealth: Payer: Self-pay

## 2022-01-31 DIAGNOSIS — M545 Low back pain, unspecified: Secondary | ICD-10-CM

## 2022-01-31 NOTE — Telephone Encounter (Signed)
Appeal faxed 01/31/22 to [facility] Part D Appeals.

## 2022-01-31 NOTE — Telephone Encounter (Signed)
Patient calls nurse line reporting difficulties obtaining Tramadol prescription.   I called the pharmacy and was told only a 5 day supply was dispensed on 5/18.  Pharmacy reports due to the [place] STOP ACT only a 5 day supply can be dispensed with initial prescription. Or if "months" have gone by without prescription being filled.   Please send in the remained of prescription. #25 tabs.   Will forward to PCP.

## 2022-02-01 MED ORDER — TRAMADOL HCL 50 MG PO TABS: 50.0000 mg | ORAL_TABLET | Freq: Every day | ORAL | 0 refills | Status: DC | PRN

## 2022-02-01 NOTE — Telephone Encounter (Signed)
Prior Auth APPEAL for patients medication PREGABALIN approved by [employer] from 09/10/21 to 09/09/22.  Patients pharmacy notified.

## 2022-02-12 ENCOUNTER — Other Ambulatory Visit (HOSPITAL_COMMUNITY): Payer: Self-pay | Admitting: Psychiatry

## 2022-02-12 ENCOUNTER — Other Ambulatory Visit: Payer: Self-pay

## 2022-02-12 DIAGNOSIS — F411 Generalized anxiety disorder: Secondary | ICD-10-CM

## 2022-02-12 DIAGNOSIS — F319 Bipolar disorder, unspecified: Secondary | ICD-10-CM

## 2022-02-12 MED ORDER — CITALOPRAM HYDROBROMIDE 40 MG PO TABS
ORAL_TABLET | Freq: Every day | ORAL | 3 refills | Status: DC
  Filled 2022-02-12: qty 30, 30d supply, fill #0

## 2022-02-13 ENCOUNTER — Encounter: Payer: Self-pay | Admitting: *Deleted

## 2022-02-15 ENCOUNTER — Other Ambulatory Visit: Payer: Self-pay

## 2022-02-19 ENCOUNTER — Other Ambulatory Visit: Payer: Self-pay | Admitting: Family Medicine

## 2022-02-19 DIAGNOSIS — M545 Low back pain, unspecified: Secondary | ICD-10-CM

## 2022-02-23 ENCOUNTER — Telehealth (INDEPENDENT_AMBULATORY_CARE_PROVIDER_SITE_OTHER): Payer: Medicare HMO | Admitting: Psychiatry

## 2022-02-23 ENCOUNTER — Encounter (HOSPITAL_COMMUNITY): Payer: Self-pay | Admitting: Psychiatry

## 2022-02-23 DIAGNOSIS — F319 Bipolar disorder, unspecified: Secondary | ICD-10-CM

## 2022-02-23 DIAGNOSIS — F411 Generalized anxiety disorder: Secondary | ICD-10-CM | POA: Diagnosis not present

## 2022-02-23 DIAGNOSIS — R69 Illness, unspecified: Secondary | ICD-10-CM | POA: Diagnosis not present

## 2022-02-23 MED ORDER — CITALOPRAM HYDROBROMIDE 40 MG PO TABS: ORAL_TABLET | Freq: Every day | ORAL | 3 refills | Status: DC

## 2022-02-23 MED ORDER — TRAZODONE HCL 50 MG PO TABS: ORAL_TABLET | Freq: Every day | ORAL | 3 refills | Status: DC

## 2022-02-23 MED ORDER — HYDROXYZINE HCL 25 MG PO TABS: ORAL_TABLET | Freq: Three times a day (TID) | ORAL | 3 refills | Status: DC | PRN

## 2022-02-23 MED ORDER — MELATONIN 5 MG PO TABS: ORAL_TABLET | Freq: Every day | ORAL | 3 refills | Status: DC

## 2022-02-23 NOTE — Progress Notes (Signed)
BH MD/PA/NP OP Progress Note Virtual Visit via Telephone Note  I connected with Amanda Larson on 02/23/22 at 11:00 AM EDT by telephone and verified that I am speaking with the correct person using two identifiers.  Location: Patient: home Provider: Clinic   I discussed the limitations, risks, security and privacy concerns of performing an evaluation and management service by telephone and the availability of in person appointments. I also discussed with the patient that there may be a patient responsible charge related to this service. The patient expressed understanding and agreed to proceed.   I provided 30 minutes of non-face-to-face time during this encounter.   02/23/2022 11:02 AM Amanda Larson  MRN:  [id number]  Chief Complaint: "I feel fine but I stopped Vraylar two weeks ago"    HPI: 56 year old female seen today for follow up psychiatric evaluation. She has a psychiatric history of Bipolar I disorder, depression, and anxiety. She is currently managed on Vraylar 1.5mg  daily, Celexa 40 mg daily, hydroxyzine 25 mg three times daily, Trazodone 50 mg nightly as needed, and melatonin 5mg  at night. She notes that her medications are effective in managing her psychiatric conditions.  Patient notes that she has not taken Vraylar in 2 weeks because her new insurance will not cover it.  Today she was unable to login virtually so her assessment was done over the phone. During exam she was pleasant, cooperative, and engaged in conversation. She informed that she continues to feel mentally stable without vraylar. At this time she does not want to restart it. She notes that she has minimal anxiety and depression.   Today provider conducted a GAD 7 and patient scored a 8.  Provider also conducted a PHQ 9 and patient scored an 11. She endorses adequate sleep and appetite.  Today she denies SI/HI/VAH, mania, or paranoia.  At this patient does not want to restart Vyaylar. She will continue  all other medications as prescribed.  No other concerns at this time.  Visit Diagnosis:    ICD-10-CM   1. Bipolar I disorder (HCC)  F31.9 citalopram (CELEXA) 40 MG tablet    traZODone (DESYREL) 50 MG tablet    melatonin 5 MG TABS    2. Generalized anxiety disorder  F41.1 citalopram (CELEXA) 40 MG tablet    hydrOXYzine (ATARAX) 25 MG tablet      Past Psychiatric History: depression, anxiety, mood disorder, and PTSD.  Past Medical History:  Past Medical History:  Diagnosis Date   Bronchitis    Chronic back pain    Depression    Diabetes mellitus without complication (HCC)    Osteoarthritis     Past Surgical History:  Procedure Laterality Date   CESAREAN SECTION     CHOLECYSTECTOMY     TUBAL LIGATION      Family Psychiatric History: Daughter ADHD and Bipolar, Daughter ADHD and PTSD, Son ADHD, Grandson ADD and ADHD  Family History:  Family History  Problem Relation Age of Onset   Cancer Father        skin   Heart disease Paternal Grandfather    Stroke Paternal Grandfather    Drug abuse Paternal Grandfather    High Cholesterol Mother    Diabetes Maternal Aunt    Hypertension Maternal Aunt    Diabetes Maternal Uncle     Social History:  Social History   Socioeconomic History   Marital status: Significant Other    Spouse name: Not on file   Number of children: 3  Years of education: Not on file   Highest education level: Some college, no degree  Occupational History   Not on file  Tobacco Use   Smoking status: Former    Types: Cigarettes    Quit date: 07/31/2007    Years since quitting: 14.5   Smokeless tobacco: Never  Vaping Use   Vaping Use: Never used  Substance and Sexual Activity   Alcohol use: Yes    Comment: 3 drinks a year    Drug use: Yes    Types: Marijuana   Sexual activity: Yes    Birth control/protection: Surgical  Other Topics Concern   Not on file  Social History Narrative   Not on file   Social Determinants of Health    Financial Resource Strain: Not on file  Food Insecurity: Not on file  Transportation Needs: No Transportation Needs (12/30/2018)   PRAPARE - Administrator, Civil Service (Medical): No    Lack of Transportation (Non-Medical): No  Physical Activity: Not on file  Stress: Not on file  Social Connections: Not on file    Allergies:  Allergies  Allergen Reactions   Sulfa Antibiotics Anaphylaxis and Swelling   Clarithromycin Nausea Only    Metabolic Disorder Labs: Lab Results  Component Value Date   HGBA1C 6.9 11/01/2021   No results found for: "PROLACTIN" Lab Results  Component Value Date   CHOL 205 (H) 11/04/2020   TRIG 218 (H) 11/04/2020   HDL 35 (L) 11/04/2020   CHOLHDL 5.9 (H) 11/04/2020   LDLCALC 131 (H) 11/04/2020   LDLCALC 127 (H) 07/16/2019   No results found for: "TSH"  Therapeutic Level Labs: No results found for: "LITHIUM" No results found for: "VALPROATE" No results found for: "CBMZ"  Current Medications: Current Outpatient Medications  Medication Sig Dispense Refill   albuterol (VENTOLIN HFA) 108 (90 Base) MCG/ACT inhaler Inhale 2 puffs into the lungs every 6 (six) hours as needed for wheezing or shortness of breath. 6.7 g 4   cariprazine (VRAYLAR) 1.5 MG capsule TAKE 1 CAPSULE (1.5 MG TOTAL) BY MOUTH DAILY. 30 capsule 1   citalopram (CELEXA) 40 MG tablet TAKE 1 TABLET (40 MG TOTAL) BY MOUTH DAILY. 30 tablet 3   Diclofenac Sodium 3 % GEL Apply 2 g topically in the morning, at noon, in the evening, and at bedtime. 100 g 2   Dulaglutide (TRULICITY) 3 MG/0.5ML SOPN Inject 3 mg as directed once a week. 0.5 mL 3   famotidine (PEPCID) 20 MG tablet Take 1 tablet (20 mg total) by mouth 2 (two) times daily. 60 tablet 2   hydrOXYzine (ATARAX) 25 MG tablet TAKE 1 TABLET (25 MG TOTAL) BY MOUTH 3 (THREE) TIMES DAILY AS NEEDED FOR ANXIETY. 90 tablet 3   melatonin 5 MG TABS TAKE 1 TABLET (5 MG TOTAL) BY MOUTH AT BEDTIME. 30 tablet 3   meloxicam (MOBIC) 15 MG  tablet Take 1 tablet by mouth once daily 30 tablet 0   metFORMIN (GLUCOPHAGE-XR) 500 MG 24 hr tablet TAKE 3 TABLETS BY MOUTH ONCE DAILY WITH BREAKFAST 90 tablet 1   Omega-3 Fatty Acids (FISH OIL) 1000 MG CAPS Take by mouth.     pregabalin (LYRICA) 100 MG capsule Take 1 capsule (100 mg total) by mouth 2 (two) times daily. 180 capsule 0   simvastatin (ZOCOR) 40 MG tablet Take 1 tablet (40 mg total) by mouth at bedtime. 30 tablet 2   traMADol (ULTRAM) 50 MG tablet Take 1 tablet (50 mg total) by  mouth daily as needed for up to 25 days. 25 tablet 0   traZODone (DESYREL) 50 MG tablet TAKE 1 TABLET (50 MG TOTAL) BY MOUTH AT BEDTIME. 30 tablet 3   triamcinolone ointment (KENALOG) 0.5 % Apply 1 application topically 2 (two) times daily. 30 g 0   No current facility-administered medications for this visit.     Musculoskeletal: Strength & Muscle Tone:  Unable to assess due to telephone visit Gait & Station: Unable to assess due to telephone visit Patient leans: N/A  Psychiatric Specialty Exam: Review of Systems  Last menstrual period 01/17/2014.There is no height or weight on file to calculate BMI.  General Appearance:  Unable to assess due to telephone visit  Eye Contact:   Unable to assess due to telephone visit  Speech:  Clear and Coherent and Normal Rate  Volume:  Normal  Mood:  Euthymic  Affect:  Appropriate and Congruent  Thought Process:  Coherent, Goal Directed and Linear  Orientation:  Full (Time, Place, and Person)  Thought Content: WDL and Logical   Suicidal Thoughts:  No  Homicidal Thoughts:  No  Memory:  Immediate;   Good Recent;   Good Remote;   Good  Judgement:  Good  Insight:  Good  Psychomotor Activity:  Normal  Concentration:  Concentration: Good and Attention Span: Good  Recall:  Good  Fund of Knowledge: Good  Language: Good  Akathisia:   Unable to assess due to telephone visit  Handed:  Right  AIMS (if indicated): Not done  Assets:  Communication Skills Desire  for Improvement Financial Resources/Insurance Housing Social Support  ADL's:  Intact  Cognition: WNL  Sleep:  Good   Screenings: GAD-7    Flowsheet Row Video Visit from 02/23/2022 in [facility] Video Visit from 09/08/2021 in [facility] Video Visit from 06/02/2021 in [facility] Video Visit from 03/03/2021 in [facility] Clinical Support from 11/14/2020 in [facility]  Total GAD-7 Score 8 12 11 9 10       PHQ2-9    Flowsheet Row Video Visit from 02/23/2022 in [facility] Office Visit from 01/25/2022 in [facility] Office Visit from 11/01/2021 in [facility] Video Visit from 09/08/2021 in [facility] Office Visit from 07/31/2021 in [facility]  PHQ-2 Total Score 2 4 2 2 2   PHQ-9 Total Score 3 11 8 4 8       Flowsheet Row Clinical Support from 11/14/2020 in [facility]  C-SSRS RISK CATEGORY No Risk        Assessment and Plan: Patient notes that she has been doing well on her current medication regimen. At this patient does not want to restart Vyaylar. She will continue all other medications as prescribed.   1. Bipolar I disorder (HCC)  Continue- citalopram (CELEXA) 40 MG tablet; TAKE 1 TABLET (40 MG TOTAL) BY MOUTH DAILY.  Dispense: 30 tablet; Refill: 3 Continue- melatonin 5 MG TABS; TAKE 1 TABLET (5 MG TOTAL) BY MOUTH AT BEDTIME.  Dispense: 30 tablet; Refill: 3 Continue- traZODone (DESYREL) 50 MG tablet; TAKE 1 TABLET (50 MG TOTAL) BY MOUTH AT BEDTIME.  Dispense: 30 tablet; Refill: 3  2. Generalized anxiety disorder  Continue- citalopram (CELEXA) 40 MG tablet; TAKE 1 TABLET (40 MG TOTAL) BY MOUTH DAILY.  Dispense: 30 tablet; Refill: 3 Continue- hydrOXYzine (ATARAX) 25 MG  tablet; TAKE 1 TABLET  (25 MG TOTAL) BY MOUTH 3 (THREE) TIMES DAILY AS NEEDED FOR ANXIETY.  Dispense: 90 tablet; Refill: 3    Follow up in 3 months  Follow up with therapy  Shanna Cisco, NP 02/23/2022, 11:02 AM

## 2022-02-25 ENCOUNTER — Other Ambulatory Visit: Payer: Self-pay | Admitting: Family Medicine

## 2022-02-25 DIAGNOSIS — M545 Low back pain, unspecified: Secondary | ICD-10-CM

## 2022-03-08 ENCOUNTER — Other Ambulatory Visit: Payer: Self-pay | Admitting: Family Medicine

## 2022-03-12 ENCOUNTER — Other Ambulatory Visit: Payer: Self-pay | Admitting: Family Medicine

## 2022-03-12 DIAGNOSIS — G8929 Other chronic pain: Secondary | ICD-10-CM

## 2022-03-26 ENCOUNTER — Other Ambulatory Visit: Payer: Self-pay | Admitting: Family Medicine

## 2022-03-26 DIAGNOSIS — M545 Low back pain, unspecified: Secondary | ICD-10-CM

## 2022-03-27 ENCOUNTER — Telehealth (HOSPITAL_COMMUNITY): Payer: Medicare HMO | Admitting: Psychiatry

## 2022-03-28 ENCOUNTER — Other Ambulatory Visit: Payer: Self-pay | Admitting: Family Medicine

## 2022-03-28 DIAGNOSIS — G8929 Other chronic pain: Secondary | ICD-10-CM

## 2022-03-29 ENCOUNTER — Other Ambulatory Visit: Payer: Self-pay | Admitting: Family Medicine

## 2022-03-29 DIAGNOSIS — E785 Hyperlipidemia, unspecified: Secondary | ICD-10-CM

## 2022-03-29 DIAGNOSIS — E119 Type 2 diabetes mellitus without complications: Secondary | ICD-10-CM

## 2022-04-13 ENCOUNTER — Other Ambulatory Visit: Payer: Self-pay | Admitting: Student

## 2022-04-13 DIAGNOSIS — M545 Low back pain, unspecified: Secondary | ICD-10-CM

## 2022-04-23 ENCOUNTER — Other Ambulatory Visit: Payer: Self-pay | Admitting: Student

## 2022-04-23 DIAGNOSIS — E785 Hyperlipidemia, unspecified: Secondary | ICD-10-CM

## 2022-04-23 DIAGNOSIS — E119 Type 2 diabetes mellitus without complications: Secondary | ICD-10-CM

## 2022-04-27 ENCOUNTER — Encounter: Payer: Self-pay | Admitting: Student

## 2022-04-27 ENCOUNTER — Ambulatory Visit (INDEPENDENT_AMBULATORY_CARE_PROVIDER_SITE_OTHER): Payer: Medicare HMO | Admitting: Student

## 2022-04-27 ENCOUNTER — Other Ambulatory Visit: Payer: Self-pay

## 2022-04-27 VITALS — BP 110/70 | HR 87 | Wt 313.0 lb

## 2022-04-27 DIAGNOSIS — F319 Bipolar disorder, unspecified: Secondary | ICD-10-CM

## 2022-04-27 DIAGNOSIS — M79674 Pain in right toe(s): Secondary | ICD-10-CM | POA: Insufficient documentation

## 2022-04-27 DIAGNOSIS — G8929 Other chronic pain: Secondary | ICD-10-CM

## 2022-04-27 DIAGNOSIS — M5440 Lumbago with sciatica, unspecified side: Secondary | ICD-10-CM | POA: Diagnosis not present

## 2022-04-27 DIAGNOSIS — R69 Illness, unspecified: Secondary | ICD-10-CM | POA: Diagnosis not present

## 2022-04-27 DIAGNOSIS — E1165 Type 2 diabetes mellitus with hyperglycemia: Secondary | ICD-10-CM

## 2022-04-27 MED ORDER — PREGABALIN 100 MG PO CAPS: 100.0000 mg | ORAL_CAPSULE | Freq: Two times a day (BID) | ORAL | 0 refills | Status: DC

## 2022-04-27 NOTE — Patient Instructions (Addendum)
It was great to see you today! Thank you for choosing [facility] for your primary care. Amanda Larson was seen for .  Today we addressed: soak the affected foot in warm, soapy water for 10 to 20 minutes twice daily, with bacitracin or neosporin cream  You can continue with supportive therapy for the cough and sore throat, tylenol/ibuprofen  I will check some labs today as well   If you haven't already, sign up for My Chart to have easy access to your labs results, and communication with your primary care physician.  We are checking some labs today. If they are abnormal, I will call you. If they are normal, I will send you a MyChart message (if it is active) or a letter in the mail. If you do not hear about your labs in the next 2 weeks, please call the office. I recommend that you always bring your medications to each appointment as this makes it easy to ensure you are on the correct medications and helps [place] not miss refills when you need them. Call the clinic at [phone] if your symptoms worsen or you have any concerns.  You should return to our clinic Return in about 4 weeks (around 05/25/2022) for pap smear . Please arrive 15 minutes before your appointment to ensure smooth check in process.  We appreciate your efforts in making this happen.  Thank you for allowing me to participate in your care, Alfredo Martinez, MD 04/27/2022, 10:48 AM PGY-2, [facility]

## 2022-04-27 NOTE — Assessment & Plan Note (Signed)
Has been getting Vraylar prescription from behavioral health facility per chart review, does not appear that we have had been filling it.  Call behavioral health to have refill if indicated

## 2022-04-27 NOTE — Assessment & Plan Note (Signed)
stable - Last A1c:  Lab Results  Component Value Date   HGBA1C 6.9 11/01/2021   - Medications: Metformin - Compliance: Good  - Checking BG at home: yes, will check updated A1C today and continue with medication regimen until A1C results. May be good candidate for Ozempic if insurance permits   Continue with Pregabalin current dosage. Return in 3 months

## 2022-04-27 NOTE — Assessment & Plan Note (Signed)
Given history, could have experienced slight trauma to the toe versus ingrown toenail No appearance of systemic infectious symptoms and overall toe has a normal appearance Continue with warm soap and water for 10 minutes time twice a day for a couple of weeks in addition to bacitracin ointment as indicated.  Can use Tylenol and ibuprofen for pain relief.  If symptoms persist, return to clinic

## 2022-04-27 NOTE — Progress Notes (Signed)
SUBJECTIVE:   CHIEF COMPLAINT / HPI:   Toe Pain  x4 days  Was cutting toe nail and noticed swelling and heat from 4th toe on right foot   Tender to touch but no trouble moving it  No drainage or cuts, or known trauma to the toe   Type 2 Diabetes: Last two A1C's below. Home medications include: Metformin. Does endorse compliance. Home glucose monitoring is performed regularly.   Most recent A1Cs:  Lab Results  Component Value Date   HGBA1C 6.9 11/01/2021   HGBA1C 6.4 07/31/2021   Last Microalbumin, LDL, Creatinine: Lab Results  Component Value Date   MICROALBUR 30 04/13/2019   LDLCALC 131 (H) 11/04/2020   CREATININE 0.68 07/31/2021    Patient is up to date on diabetic eye through [facility], need records  Patient is not up to date on diabetic foot exam.  Patient also requests refill of Pregabalin and Vraylar. Had minor cold like symptoms recently that are improving.        04/27/2022   10:05 AM 02/23/2022   10:43 AM 01/25/2022    2:28 PM  PHQ9 SCORE ONLY  PHQ-9 Total Score 8  11     Information is confidential and restricted. Go to Review Flowsheets to unlock data.     PERTINENT  PMH / PSH:   Past Medical History:  Diagnosis Date   Bronchitis    Chronic back pain    Depression    Diabetes mellitus without complication (HCC)    Osteoarthritis     OBJECTIVE:  BP 110/70   Pulse 87   Wt (!) 313 lb (142 kg)   LMP 01/17/2014   SpO2 97%   BMI 59.14 kg/m   General: NAD, pleasant, able to participate in exam Cardiac: RRR, no murmurs auscultated Respiratory: CTAB, normal WOB Abdomen: soft, non-tender, non-distended, normoactive bowel sounds Extremities: warm and well perfused, no edema or cyanosis Foot exam: Good proprioception and sensation without ulcerations bilaterally. 4th toe on right foot tender to touch slightly erythematous with no drainage or open ulcerations   Skin: warm and dry, no rashes noted Neuro: alert, no obvious focal deficits, speech  normal Psych: Normal affect and mood  ASSESSMENT/PLAN:  Type 2 diabetes mellitus with hyperglycemia (HCC) stable - Last A1c:  Lab Results  Component Value Date   HGBA1C 6.9 11/01/2021   - Medications: Metformin - Compliance: Good  - Checking BG at home: yes, will check updated A1C today and continue with medication regimen until A1C results. May be good candidate for Ozempic if insurance permits   Continue with Pregabalin current dosage. Return in 3 months    Bipolar I disorder (HCC) Has been getting Vraylar prescription from behavioral health facility per chart review, does not appear that we have had been filling it.  Call behavioral health to have refill if indicated   Pain of toe of right foot Given history, could have experienced slight trauma to the toe versus ingrown toenail No appearance of systemic infectious symptoms and overall toe has a normal appearance Continue with warm soap and water for 10 minutes time twice a day for a couple of weeks in addition to bacitracin ointment as indicated.  Can use Tylenol and ibuprofen for pain relief.  If symptoms persist, return to clinic   Orders Placed This Encounter  Procedures   Hemoglobin A1c   Basic Metabolic Panel   Lipid panel   Microalbumin/Creatinine Ratio, Urine   Meds ordered this encounter  Medications   pregabalin (  LYRICA) 100 MG capsule    Sig: Take 1 capsule (100 mg total) by mouth 2 (two) times daily.    Dispense:  180 capsule    Refill:  0   Return in about 4 weeks (around 05/25/2022) for pap smear . Alfredo Martinez, MD 04/27/2022, 1:16 PM PGY-2, [facility]

## 2022-04-28 LAB — LIPID PANEL
Chol/HDL Ratio: 3.3 ratio (ref 0.0–4.4)
Cholesterol, Total: 116 mg/dL (ref 100–199)
HDL: 35 mg/dL — ABNORMAL LOW (ref >39)
LDL Chol Calc (NIH): 57 mg/dL (ref 0–99)
Triglycerides: 133 mg/dL (ref 0–149)
VLDL Cholesterol Cal: 24 mg/dL (ref 5–40)

## 2022-04-28 LAB — HEMOGLOBIN A1C
Est. average glucose Bld gHb Est-mCnc: 157 mg/dL
Hgb A1c MFr Bld: 7.1 % — ABNORMAL HIGH (ref 4.8–5.6)

## 2022-04-28 LAB — BASIC METABOLIC PANEL
BUN/Creatinine Ratio: 24 — ABNORMAL HIGH (ref 9–23)
BUN: 16 mg/dL (ref 6–24)
CO2: 23 mmol/L (ref 20–29)
Calcium: 9.8 mg/dL (ref 8.7–10.2)
Chloride: 102 mmol/L (ref 96–106)
Creatinine, Ser: 0.67 mg/dL (ref 0.57–1.00)
Glucose: 158 mg/dL — ABNORMAL HIGH (ref 70–99)
Potassium: 4.4 mmol/L (ref 3.5–5.2)
Sodium: 139 mmol/L (ref 134–144)
eGFR: 103 mL/min/{1.73_m2} (ref >59)

## 2022-04-28 LAB — MICROALBUMIN / CREATININE URINE RATIO
Creatinine, Urine: 171.2 mg/dL
Microalb/Creat Ratio: 18 mg/g creat (ref 0–29)
Microalbumin, Urine: 30.9 ug/mL

## 2022-04-29 ENCOUNTER — Other Ambulatory Visit: Payer: Self-pay | Admitting: Student

## 2022-04-29 DIAGNOSIS — M545 Low back pain, unspecified: Secondary | ICD-10-CM

## 2022-05-01 ENCOUNTER — Other Ambulatory Visit (HOSPITAL_COMMUNITY): Payer: Self-pay

## 2022-05-10 ENCOUNTER — Other Ambulatory Visit: Payer: Self-pay | Admitting: Student

## 2022-05-10 DIAGNOSIS — M545 Low back pain, unspecified: Secondary | ICD-10-CM

## 2022-05-14 ENCOUNTER — Other Ambulatory Visit: Payer: Self-pay | Admitting: Family Medicine

## 2022-05-23 ENCOUNTER — Other Ambulatory Visit: Payer: Self-pay | Admitting: Student

## 2022-05-23 ENCOUNTER — Telehealth: Payer: Self-pay

## 2022-05-23 DIAGNOSIS — E785 Hyperlipidemia, unspecified: Secondary | ICD-10-CM

## 2022-05-23 DIAGNOSIS — E119 Type 2 diabetes mellitus without complications: Secondary | ICD-10-CM

## 2022-05-23 NOTE — Telephone Encounter (Signed)
Patient calls nurse line requesting excuse for jury duty. Patient was scheduled to have jury duty on 9/5. She states that she is disabled and unable to appear, however, they need note from doctor.   Patient will call back with juror number for documentation.   Veronda Prude, RN

## 2022-05-25 ENCOUNTER — Encounter (HOSPITAL_COMMUNITY): Payer: Self-pay | Admitting: Psychiatry

## 2022-05-25 ENCOUNTER — Telehealth (INDEPENDENT_AMBULATORY_CARE_PROVIDER_SITE_OTHER): Payer: Medicare HMO | Admitting: Psychiatry

## 2022-05-25 DIAGNOSIS — F411 Generalized anxiety disorder: Secondary | ICD-10-CM | POA: Diagnosis not present

## 2022-05-25 DIAGNOSIS — F319 Bipolar disorder, unspecified: Secondary | ICD-10-CM

## 2022-05-25 DIAGNOSIS — R69 Illness, unspecified: Secondary | ICD-10-CM | POA: Diagnosis not present

## 2022-05-25 MED ORDER — CITALOPRAM HYDROBROMIDE 40 MG PO TABS: ORAL_TABLET | Freq: Every day | ORAL | 3 refills | Status: DC

## 2022-05-25 MED ORDER — TRAZODONE HCL 50 MG PO TABS: ORAL_TABLET | Freq: Every day | ORAL | 3 refills | Status: DC

## 2022-05-25 MED ORDER — HYDROXYZINE HCL 25 MG PO TABS: ORAL_TABLET | Freq: Three times a day (TID) | ORAL | 3 refills | Status: DC | PRN

## 2022-05-25 MED ORDER — MELATONIN 5 MG PO CAPS: 5.0000 mg | ORAL_CAPSULE | Freq: Every evening | ORAL | 3 refills | Status: DC | PRN

## 2022-05-25 NOTE — Progress Notes (Signed)
BH MD/PA/NP OP Progress Note Virtual Visit via Telephone Note  I connected with Amanda Larson on 05/25/22 at 10:00 AM EDT by telephone and verified that I am speaking with the correct person using two identifiers.  Location: Patient: home Provider: Clinic   I discussed the limitations, risks, security and privacy concerns of performing an evaluation and management service by telephone and the availability of in person appointments. I also discussed with the patient that there may be a patient responsible charge related to this service. The patient expressed understanding and agreed to proceed.   I provided 30 minutes of non-face-to-face time during this encounter.   05/25/2022 10:14 AM Amanda Larson  MRN:  [id number]  Chief Complaint: "Nothing has changed I am okay"    HPI: 56 year old female seen today for follow up psychiatric evaluation. She has a psychiatric history of Bipolar I disorder, depression, and anxiety. She is currently managed on  Celexa 40 mg daily, hydroxyzine 25 mg three times daily, Trazodone 50 mg nightly as needed, and melatonin 5mg  at night. She notes that her medications are effective in managing her psychiatric conditions.    Today she was unable to login virtually so her assessment was done over the phone. During exam she was pleasant, cooperative, and engaged in conversation. She informed that nothing much is changed.  Overall she informed Clinical research associate that she is doing okay.  She notes that she continues to feel stable without her Vraylar.  Patient informed writer that she has minimal anxiety and depression.  Provider conducted GAD-7 and patient scored a 10, at her last visit she scored an 8.  Provider also conducted PHQ-9 patient scored a 5, at her last visit she scored an 11.  She endorses adequate sleep and appetite.   Today she denies SI/HI/VAH, mania, or paranoia.  No medication changes made today.  Patient agreeable to continue medication as prescribed.   No other concerns at this time.  Visit Diagnosis:    ICD-10-CM   1. Bipolar I disorder (HCC)  F31.9 citalopram (CELEXA) 40 MG tablet    traZODone (DESYREL) 50 MG tablet    Melatonin 5 MG CAPS    2. Generalized anxiety disorder  F41.1 citalopram (CELEXA) 40 MG tablet    hydrOXYzine (ATARAX) 25 MG tablet      Past Psychiatric History: depression, anxiety, mood disorder, and PTSD.  Past Medical History:  Past Medical History:  Diagnosis Date   Bronchitis    Chronic back pain    Depression    Diabetes mellitus without complication (HCC)    Osteoarthritis     Past Surgical History:  Procedure Laterality Date   CESAREAN SECTION     CHOLECYSTECTOMY     TUBAL LIGATION      Family Psychiatric History: Daughter ADHD and Bipolar, Daughter ADHD and PTSD, Son ADHD, Grandson ADD and ADHD  Family History:  Family History  Problem Relation Age of Onset   Cancer Father        skin   Heart disease Paternal Grandfather    Stroke Paternal Grandfather    Drug abuse Paternal Grandfather    High Cholesterol Mother    Diabetes Maternal Aunt    Hypertension Maternal Aunt    Diabetes Maternal Uncle     Social History:  Social History   Socioeconomic History   Marital status: Significant Other    Spouse name: Not on file   Number of children: 3   Years of education: Not on file  Highest education level: Some college, no degree  Occupational History   Not on file  Tobacco Use   Smoking status: Former    Types: Cigarettes    Quit date: 07/31/2007    Years since quitting: 14.8    Passive exposure: Past   Smokeless tobacco: Never  Vaping Use   Vaping Use: Never used  Substance and Sexual Activity   Alcohol use: Yes    Comment: 3 drinks a year    Drug use: Yes    Types: Marijuana   Sexual activity: Yes    Birth control/protection: Surgical  Other Topics Concern   Not on file  Social History Narrative   Not on file   Social Determinants of Health   Financial Resource  Strain: Not on file  Food Insecurity: Not on file  Transportation Needs: No Transportation Needs (12/30/2018)   PRAPARE - Administrator, Civil Service (Medical): No    Lack of Transportation (Non-Medical): No  Physical Activity: Not on file  Stress: Not on file  Social Connections: Not on file    Allergies:  Allergies  Allergen Reactions   Sulfa Antibiotics Anaphylaxis and Swelling   Clarithromycin Nausea Only    Metabolic Disorder Labs: Lab Results  Component Value Date   HGBA1C 7.1 (H) 04/27/2022   No results found for: "PROLACTIN" Lab Results  Component Value Date   CHOL 116 04/27/2022   TRIG 133 04/27/2022   HDL 35 (L) 04/27/2022   CHOLHDL 3.3 04/27/2022   LDLCALC 57 04/27/2022   LDLCALC 131 (H) 11/04/2020   No results found for: "TSH"  Therapeutic Level Labs: No results found for: "LITHIUM" No results found for: "VALPROATE" No results found for: "CBMZ"  Current Medications: Current Outpatient Medications  Medication Sig Dispense Refill   Melatonin 5 MG CAPS Take 1 capsule (5 mg total) by mouth at bedtime as needed. 30 capsule 3   albuterol (VENTOLIN HFA) 108 (90 Base) MCG/ACT inhaler Inhale 2 puffs into the lungs every 6 (six) hours as needed for wheezing or shortness of breath. 6.7 g 4   cariprazine (VRAYLAR) 1.5 MG capsule TAKE 1 CAPSULE (1.5 MG TOTAL) BY MOUTH DAILY. 30 capsule 1   citalopram (CELEXA) 40 MG tablet TAKE 1 TABLET (40 MG TOTAL) BY MOUTH DAILY. 30 tablet 3   Diclofenac Sodium 3 % GEL Apply 2 g topically in the morning, at noon, in the evening, and at bedtime. 100 g 2   Dulaglutide (TRULICITY) 3 MG/0.5ML SOPN Inject 3 mg as directed once a week. (Patient not taking: Reported on 04/27/2022) 0.5 mL 3   famotidine (PEPCID) 20 MG tablet Take 1 tablet (20 mg total) by mouth 2 (two) times daily. (Patient not taking: Reported on 04/27/2022) 60 tablet 2   hydrOXYzine (ATARAX) 25 MG tablet TAKE 1 TABLET (25 MG TOTAL) BY MOUTH 3 (THREE) TIMES DAILY  AS NEEDED FOR ANXIETY. 90 tablet 3   meloxicam (MOBIC) 15 MG tablet TAKE 1 TABLET BY MOUTH ONCE DAILY.**APPOINTMENT REQUIRED FOR FUTURE REFILLS.** 20 tablet 0   metFORMIN (GLUCOPHAGE-XR) 500 MG 24 hr tablet TAKE 4 TABLETS BY MOUTH DAILY 120 tablet 1   Omega-3 Fatty Acids (FISH OIL) 1000 MG CAPS Take by mouth.     pregabalin (LYRICA) 100 MG capsule Take 1 capsule (100 mg total) by mouth 2 (two) times daily. 180 capsule 0   simvastatin (ZOCOR) 40 MG tablet Take 1 tablet (40 mg total) by mouth at bedtime. 30 tablet 2   traMADol (ULTRAM)  50 MG tablet Take 1 tablet by mouth once daily 30 tablet 0   traZODone (DESYREL) 50 MG tablet TAKE 1 TABLET (50 MG TOTAL) BY MOUTH AT BEDTIME. 30 tablet 3   triamcinolone ointment (KENALOG) 0.5 % Apply 1 application topically 2 (two) times daily. (Patient not taking: Reported on 04/27/2022) 30 g 0   No current facility-administered medications for this visit.     Musculoskeletal: Strength & Muscle Tone:  Unable to assess due to telephone visit Gait & Station: Unable to assess due to telephone visit Patient leans: N/A  Psychiatric Specialty Exam: Review of Systems  Last menstrual period 01/17/2014.There is no height or weight on file to calculate BMI.  General Appearance:  Unable to assess due to telephone visit  Eye Contact:   Unable to assess due to telephone visit  Speech:  Clear and Coherent and Normal Rate  Volume:  Normal  Mood:  Euthymic  Affect:  Appropriate and Congruent  Thought Process:  Coherent, Goal Directed and Linear  Orientation:  Full (Time, Place, and Person)  Thought Content: WDL and Logical   Suicidal Thoughts:  No  Homicidal Thoughts:  No  Memory:  Immediate;   Good Recent;   Good Remote;   Good  Judgement:  Good  Insight:  Good  Psychomotor Activity:  Normal  Concentration:  Concentration: Good and Attention Span: Good  Recall:  Good  Fund of Knowledge: Good  Language: Good  Akathisia:   Unable to assess due to telephone  visit  Handed:  Right  AIMS (if indicated): Not done  Assets:  Communication Skills Desire for Improvement Financial Resources/Insurance Housing Social Support  ADL's:  Intact  Cognition: WNL  Sleep:  Good   Screenings: GAD-7    Flowsheet Row Video Visit from 05/25/2022 in [facility] Video Visit from 02/23/2022 in [facility] Video Visit from 09/08/2021 in [facility] Video Visit from 06/02/2021 in [facility] Video Visit from 03/03/2021 in [facility]  Total GAD-7 Score 10 8 12 11 9       PHQ2-9    Flowsheet Row Video Visit from 05/25/2022 in [facility] Office Visit from 04/27/2022 in [facility] Video Visit from 02/23/2022 in [facility] Office Visit from 01/25/2022 in [facility] Office Visit from 11/01/2021 in [facility]  PHQ-2 Total Score 2 2 2 4 2   PHQ-9 Total Score 5 8 3 11 8       Flowsheet Row Clinical Support from 11/14/2020 in [facility]  C-SSRS RISK CATEGORY No Risk        Assessment and Plan: Patient notes that she has been doing well on her current medication regimen.  No medication changes made today.  Patient agreeable to continue medication as prescribed.    1. Bipolar I disorder (HCC)  Continue- citalopram (CELEXA) 40 MG tablet; TAKE 1 TABLET (40 MG TOTAL) BY MOUTH DAILY.  Dispense: 30 tablet; Refill: 3 Continue- melatonin 5 MG TABS; TAKE 1 TABLET (5 MG TOTAL) BY MOUTH AT BEDTIME.  Dispense: 30 tablet; Refill: 3 Continue- traZODone (DESYREL) 50 MG tablet; TAKE 1 TABLET (50 MG TOTAL) BY MOUTH AT BEDTIME.  Dispense: 30 tablet; Refill: 3  2. Generalized anxiety disorder  Continue- citalopram (CELEXA) 40 MG tablet; TAKE 1 TABLET (40 MG TOTAL) BY MOUTH DAILY.  Dispense: 30  tablet; Refill:  3 Continue- hydrOXYzine (ATARAX) 25 MG tablet; TAKE 1 TABLET (25 MG TOTAL) BY MOUTH 3 (THREE) TIMES DAILY AS NEEDED FOR ANXIETY.  Dispense: 90 tablet; Refill: 3    Follow up in 3 months  Follow up with therapy  Shanna Cisco, NP 05/25/2022, 10:14 AM

## 2022-05-28 ENCOUNTER — Other Ambulatory Visit: Payer: Self-pay | Admitting: Student

## 2022-05-28 DIAGNOSIS — M545 Low back pain, unspecified: Secondary | ICD-10-CM

## 2022-05-28 DIAGNOSIS — G8929 Other chronic pain: Secondary | ICD-10-CM

## 2022-06-06 ENCOUNTER — Other Ambulatory Visit (HOSPITAL_COMMUNITY): Payer: Self-pay

## 2022-06-06 ENCOUNTER — Telehealth: Payer: Self-pay

## 2022-06-06 NOTE — Telephone Encounter (Signed)
Received notification from [place] regarding patient assistance DENIAL for TRULICITY 3MG /0.5ML.   MEDICATION IS NOT AVAILABLE THRU [employer] AT THE MOMENT.  MEDICATION IS $47 AT [facility] USING INSURANCE  Phone: [phone]

## 2022-06-08 NOTE — Telephone Encounter (Signed)
Patient returns call to nurse line. Patient states that she is unable to afford $47 monthly for medication.   She is requesting alternative that would be cheaper.   Please advise.   Talbot Grumbling, RN

## 2022-06-14 ENCOUNTER — Other Ambulatory Visit (HOSPITAL_COMMUNITY): Payer: Self-pay

## 2022-06-15 ENCOUNTER — Encounter: Payer: Self-pay | Admitting: Student

## 2022-06-17 ENCOUNTER — Other Ambulatory Visit: Payer: Self-pay | Admitting: Student

## 2022-06-17 DIAGNOSIS — M545 Low back pain, unspecified: Secondary | ICD-10-CM

## 2022-06-19 ENCOUNTER — Other Ambulatory Visit: Payer: Self-pay

## 2022-06-19 ENCOUNTER — Telehealth: Payer: Self-pay

## 2022-06-19 MED ORDER — METFORMIN HCL ER 500 MG PO TB24: ORAL_TABLET | ORAL | 1 refills | Status: DC

## 2022-06-19 NOTE — Telephone Encounter (Signed)
Patient calls nurse line in regards to Meloxicam prescription.   Patient reports she went to her pharmacy and was told the medication was not refilled by PCP.   Advised patient after 9/21 refill she would need an apt for more.   Apt made with PCP for November.  Patient is requesting an additional refill to get to this apt.

## 2022-06-20 ENCOUNTER — Other Ambulatory Visit: Payer: Self-pay | Admitting: Student

## 2022-06-20 ENCOUNTER — Telehealth: Payer: Self-pay | Admitting: Student

## 2022-06-20 DIAGNOSIS — M545 Low back pain, unspecified: Secondary | ICD-10-CM

## 2022-06-20 DIAGNOSIS — E1165 Type 2 diabetes mellitus with hyperglycemia: Secondary | ICD-10-CM

## 2022-06-20 MED ORDER — SEMAGLUTIDE (1 MG/DOSE) 4 MG/3ML ~~LOC~~ SOPN: 1.0000 mg | PEN_INJECTOR | SUBCUTANEOUS | 1 refills | Status: DC

## 2022-06-20 MED ORDER — MELOXICAM 15 MG PO TABS: ORAL_TABLET | ORAL | 0 refills | Status: DC

## 2022-06-20 NOTE — Telephone Encounter (Signed)
Patient with difficulty obtaining Trulicity, after speaking with pharmacy, will transition to [place] with patient assistance program to assist with payment. Appreciate assistance from [place] and will keep in touch with patient. Discussed with patient via telephone about transition and she is agreeable.

## 2022-06-21 ENCOUNTER — Other Ambulatory Visit (HOSPITAL_COMMUNITY): Payer: Self-pay

## 2022-06-21 NOTE — Telephone Encounter (Signed)
Submitted application for OZEMPIC to [employer] for patient assistance.   Phone: [phone]  

## 2022-06-29 ENCOUNTER — Other Ambulatory Visit: Payer: Self-pay | Admitting: Student

## 2022-06-29 DIAGNOSIS — M545 Low back pain, unspecified: Secondary | ICD-10-CM

## 2022-07-03 ENCOUNTER — Telehealth: Payer: Self-pay

## 2022-07-03 NOTE — Telephone Encounter (Signed)
Patient calls nurse line reporting difficulties picking up Tramadol prescription.   I spoke with pharmacy and a PA is needed. They reported they did fax this over this morning.   Will forward to [place].

## 2022-07-03 NOTE — Telephone Encounter (Signed)
Prior Auth for patients medication TRAMADOL approved by [facility] MEDICARE until 09/10/23.  Key: [id number]

## 2022-07-03 NOTE — Telephone Encounter (Signed)
A Prior Authorization was initiated for this patients TRAMADOL through CoverMyMeds.   Key: [id number]

## 2022-07-05 NOTE — Telephone Encounter (Signed)
Pharmacy has been updated.

## 2022-07-10 ENCOUNTER — Other Ambulatory Visit: Payer: Self-pay | Admitting: Student

## 2022-07-10 DIAGNOSIS — G8929 Other chronic pain: Secondary | ICD-10-CM

## 2022-07-23 ENCOUNTER — Ambulatory Visit (INDEPENDENT_AMBULATORY_CARE_PROVIDER_SITE_OTHER): Payer: Medicare HMO | Admitting: Student

## 2022-07-23 ENCOUNTER — Other Ambulatory Visit: Payer: Self-pay

## 2022-07-23 ENCOUNTER — Encounter: Payer: Self-pay | Admitting: Student

## 2022-07-23 VITALS — BP 125/77 | HR 96 | Wt 309.8 lb

## 2022-07-23 DIAGNOSIS — E1165 Type 2 diabetes mellitus with hyperglycemia: Secondary | ICD-10-CM

## 2022-07-23 DIAGNOSIS — J4520 Mild intermittent asthma, uncomplicated: Secondary | ICD-10-CM

## 2022-07-23 DIAGNOSIS — M25531 Pain in right wrist: Secondary | ICD-10-CM

## 2022-07-23 LAB — POCT GLYCOSYLATED HEMOGLOBIN (HGB A1C): HbA1c, POC (controlled diabetic range): 6.7 % (ref 0.0–7.0)

## 2022-07-23 MED ORDER — ALBUTEROL SULFATE HFA 108 (90 BASE) MCG/ACT IN AERS: 2.0000 | INHALATION_SPRAY | Freq: Four times a day (QID) | RESPIRATORY_TRACT | 4 refills | Status: DC | PRN

## 2022-07-23 NOTE — Progress Notes (Unsigned)
SUBJECTIVE:   CHIEF COMPLAINT / HPI:   Medication management:  Patient's medications are listed below   Last refilled by psychiatry: Celexa 40 mg daily, Hydroxyzine TID PRN 25 mg, Melatonin, Trazodone 50 mg at bedtime.   Asthma: Albuterol 2 puffs q6 PRN DM2: Metformin 500 mg 4 tabs daily, pregabalin 100 mg BID, Ozempic 1 mg once weekly-tolerating well without side effects (changed from trulicity based on insurance).  Chronic Pain: Tramadol 50 mg, Mobic 15 mg daily   Right wrist pain:  6-7 months ago  Swelling down the hand intermittently   Hard to pick items up secondary to the pain  She denies numbness or tingling  The pain comes and goes and can be present at rest No medications seem to improve the pain.  No shoulder or neck pain, good motion of elbow Had carpal tunnel release bilaterally years ago   PERTINENT  PMH / PSH:   Past Medical History:  Diagnosis Date   Bronchitis    Chronic back pain    Depression    Diabetes mellitus without complication (HCC)    Osteoarthritis     OBJECTIVE:  BP 125/77   Pulse 96   Wt (!) 309 lb 12.8 oz (140.5 kg)   LMP 01/17/2014   SpO2 94%   BMI 58.54 kg/m  Physical Exam Vitals reviewed.  Constitutional:      Appearance: Normal appearance.  HENT:     Head: Normocephalic.     Nose: Nose normal.     Mouth/Throat:     Mouth: Mucous membranes are moist.  Eyes:     Conjunctiva/sclera: Conjunctivae normal.  Cardiovascular:     Rate and Rhythm: Normal rate and regular rhythm.  Pulmonary:     Effort: Pulmonary effort is normal.     Breath sounds: Normal breath sounds.  Musculoskeletal:     Cervical back: Normal range of motion.     Comments: No swelling or erythema over the wrist with some tenderness to palpation over the dorsum.  Significantly limited flexion and extension as well as ulnar and radial deviation actively.  Slightly improved with passive range of motion but still limited.  Skin:    General: Skin is warm.      Capillary Refill: Capillary refill takes less than 2 seconds.  Neurological:     General: No focal deficit present.     Mental Status: She is alert.  Psychiatric:        Mood and Affect: Mood normal.        Behavior: Behavior normal.      ASSESSMENT/PLAN:  Type 2 diabetes mellitus with hyperglycemia, without long-term current use of insulin (HCC) Assessment & Plan: well controlled - Last A1c:  Lab Results  Component Value Date   HGBA1C 6.7 07/23/2022   - Medications: Metformin and Ozempic>will now increase Ozempic dosage to 2 mg as she is tolerating it well  - Compliance: Good    Orders: -     POCT glycosylated hemoglobin (Hb A1C)  Intermittent asthma, unspecified asthma severity, unspecified whether complicated Assessment & Plan: Refill albuterol on request, will try to ensure it is covered by insurance as she cannot afford out of pocket  Orders: -     Albuterol Sulfate HFA; Inhale 2 puffs into the lungs every 6 (six) hours as needed for wheezing or shortness of breath.  Dispense: 6.7 g; Refill: 4  Right wrist pain Assessment & Plan: No trauma to the area, possibly related to tendinopathy vs OA; no evidence  of gout or infectious process  XR of wrist given length of time pain has been present.  Continue with mobic  Wrist exercises given  Cock up splint given in office to use all day if tolerated but at night if not able to tolerate   Orders: -     DG Wrist Complete Right; Future   Return in about 4 weeks (around 08/20/2022), or if symptoms worsen or fail to improve. Erskine Emery, MD 07/24/2022, 7:28 AM PGY-2, [place]

## 2022-07-23 NOTE — Patient Instructions (Addendum)
It was great to see you today! Thank you for choosing [facility] for your primary care. Amanda Larson was seen for wrist pain.   Please use diclofenac gel for the pain I have provided the splint to wear for the next month if tolerated  Please return if symptoms persist in one month  Increased Ozempic dose to 2 mg   XRAY  [facility] [address]  [phone]  These are options for you if you decide to do them  You can get the Shingrix vaccine at the pharmacy. You will need a 2nd shot 2-6 months after the first. This vaccine is important to help prevent shingles.  and Please remember to followup with us/your GYN for your pap smear    If you haven't already, sign up for My Chart to have easy access to your labs results, and communication with your primary care physician.  We are checking some labs today. If they are abnormal, I will call you. If they are normal, I will send you a MyChart message (if it is active) or a letter in the mail. If you do not hear about your labs in the next 2 weeks, please call the office. I recommend that you always bring your medications to each appointment as this makes it easy to ensure you are on the correct medications and helps [place] not miss refills when you need them. Call the clinic at [phone] if your symptoms worsen or you have any concerns.  You should return to our clinic Return in about 4 weeks (around 08/20/2022), or if symptoms worsen or fail to improve. Please arrive 15 minutes before your appointment to ensure smooth check in process.  We appreciate your efforts in making this happen.  Thank you for allowing me to participate in your care, Alfredo Martinez, MD 07/23/2022, 2:39 PM PGY-2, [facility]

## 2022-07-24 ENCOUNTER — Encounter: Payer: Self-pay | Admitting: Student

## 2022-07-24 DIAGNOSIS — M25531 Pain in right wrist: Secondary | ICD-10-CM | POA: Insufficient documentation

## 2022-07-24 NOTE — Assessment & Plan Note (Signed)
well controlled - Last A1c:  Lab Results  Component Value Date   HGBA1C 6.7 07/23/2022   - Medications: Metformin and Ozempic>will now increase Ozempic dosage to 2 mg as she is tolerating it well  - Compliance: Good

## 2022-07-24 NOTE — Assessment & Plan Note (Addendum)
No trauma to the area, possibly related to tendinopathy vs OA; no evidence of gout or infectious process  XR of wrist given length of time pain has been present.  Continue with mobic  Wrist exercises given  Cock up splint given in office to use all day if tolerated but at night if not able to tolerate

## 2022-07-24 NOTE — Assessment & Plan Note (Signed)
Refill albuterol on request, will try to ensure it is covered by insurance as she cannot afford out of pocket

## 2022-07-28 ENCOUNTER — Other Ambulatory Visit: Payer: Self-pay | Admitting: Student

## 2022-07-28 DIAGNOSIS — M545 Low back pain, unspecified: Secondary | ICD-10-CM

## 2022-07-29 ENCOUNTER — Other Ambulatory Visit: Payer: Self-pay | Admitting: Family Medicine

## 2022-07-29 DIAGNOSIS — G8929 Other chronic pain: Secondary | ICD-10-CM

## 2022-07-31 ENCOUNTER — Other Ambulatory Visit: Payer: Self-pay

## 2022-07-31 DIAGNOSIS — E1165 Type 2 diabetes mellitus with hyperglycemia: Secondary | ICD-10-CM

## 2022-07-31 MED ORDER — SEMAGLUTIDE (1 MG/DOSE) 4 MG/3ML ~~LOC~~ SOPN: 2.0000 mg | PEN_INJECTOR | SUBCUTANEOUS | 1 refills | Status: DC

## 2022-07-31 NOTE — Telephone Encounter (Signed)
Application pending. Rec'd letter from [employer] regarding provider info.   Will resubmit pcp pages using different provider.

## 2022-08-02 ENCOUNTER — Other Ambulatory Visit: Payer: Self-pay | Admitting: Student

## 2022-08-02 DIAGNOSIS — G8929 Other chronic pain: Secondary | ICD-10-CM

## 2022-08-13 ENCOUNTER — Telehealth: Payer: Self-pay

## 2022-08-13 NOTE — Telephone Encounter (Signed)
Patient calls nurse line regarding X-ray. Patient reports that she has been having intermittent pain in right ankle for the last three months. She reports that area is now swollen and hurts to walk on it.   She had an order placed for a right wrist X-ray and wanted to know if she could get a right ankle x-ray at the same time. Advised that patient may need provider appointment prior to this being placed, as this was not evaluated at last appointment.   Will forward request to PCP for further advisement.   Veronda Prude, RN

## 2022-08-13 NOTE — Telephone Encounter (Signed)
Scheduled patient for tomorrow with Dr. Barbaraann Faster.   Veronda Prude, RN

## 2022-08-14 ENCOUNTER — Ambulatory Visit (INDEPENDENT_AMBULATORY_CARE_PROVIDER_SITE_OTHER): Payer: Medicare HMO | Admitting: Student

## 2022-08-14 ENCOUNTER — Encounter: Payer: Self-pay | Admitting: Student

## 2022-08-14 VITALS — BP 133/70 | HR 79 | Temp 98.3°F | Ht 61.0 in | Wt 305.8 lb

## 2022-08-14 DIAGNOSIS — M25571 Pain in right ankle and joints of right foot: Secondary | ICD-10-CM | POA: Diagnosis not present

## 2022-08-14 NOTE — Patient Instructions (Addendum)
It was great to see you today! Thank you for choosing [facility] for your primary care. GESELLE Larson was seen for right ankle follow up.  Today we addressed: We will get some labs and order an Xray of the ankle  I will also refer to sports medicine for assessment and possibly to pull off fluid [phone]  If you haven't already, sign up for My Chart to have easy access to your labs results, and communication with your primary care physician.  I recommend that you always bring your medications to each appointment as this makes it easy to ensure you are on the correct medications and helps [place] not miss refills when you need them. Call the clinic at [phone] if your symptoms worsen or you have any concerns.  You should return to our clinic No follow-ups on file. Please arrive 15 minutes before your appointment to ensure smooth check in process.  We appreciate your efforts in making this happen.  Thank you for allowing me to participate in your care, Alfredo Martinez, MD 08/14/2022, 4:22 PM PGY-2, [facility]

## 2022-08-14 NOTE — Progress Notes (Signed)
  SUBJECTIVE:   CHIEF COMPLAINT / HPI:   Ankle pain and swelling for 3 months Patient reports right external ankle pain that is been ongoing for 3 months and worsening for the last month. The pain has progressed to the point where it is difficult to walk. She can move the ankle but with pain. Patient has no history of gout. Patient denies any systemic symptoms, chills, fever, chest pain, dyspnea. No trauma to cause the onset   PERTINENT  PMH / PSH:   OA, DM2, asthma, chronic low back pain, depression, hyperlipidemia, OSA, psoriasis, bipolar disorder, GAD  OBJECTIVE:  BP 133/70   Pulse 79   Temp 98.3 F (36.8 C)   Ht 5\' 1"  (1.549 m)   Wt (!) 305 lb 12.8 oz (138.7 kg)   LMP 01/17/2014   SpO2 94%   BMI 57.78 kg/m  Physical Exam  General: Alert and oriented in no apparent distress Heart: Regular rate and rhythm with no murmurs appreciated Lungs: Normal WOB Skin: Warm and dry Extremities: Right lateral malleoli are tenderness and swelling with warmth and slight erythema.  Patient has full range of motion with tenderness with eversion. Able to ambulate with limping. Palpable DP pulses   ASSESSMENT/PLAN:  Acute right ankle pain Assessment & Plan: Will try to rule out some type of fracture although unlikely given lack of trauma with x-ray. Patient could be suffering from gout versus septic arthritis although both are less likely as she is without systemic symptoms and is hemodynamically stable on examination. Will continue with inflammatory markers and uric acid for further assessment.  In addition to this, given her significant swelling, will refer her to sports medicine for possible ultrasound guided aspiration if indicated.  Orders: -     DG Ankle Complete Right; Future -     Sedimentation rate -     C-reactive protein -     Uric acid -     Ambulatory referral to Sports Medicine   No follow-ups on file. 03/19/2014, MD 08/14/2022, 4:42 PM PGY-2, [facility]

## 2022-08-14 NOTE — Assessment & Plan Note (Signed)
Will try to rule out some type of fracture although unlikely given lack of trauma with x-ray. Patient could be suffering from gout versus septic arthritis although both are less likely as she is without systemic symptoms and is hemodynamically stable on examination. Will continue with inflammatory markers and uric acid for further assessment.  In addition to this, given her significant swelling, will refer her to sports medicine for possible ultrasound guided aspiration if indicated.

## 2022-08-15 ENCOUNTER — Ambulatory Visit
Admission: RE | Admit: 2022-08-15 | Discharge: 2022-08-15 | Disposition: A | Discharge status: Home / Self Care | Payer: Medicare HMO | Source: Ambulatory Visit | Attending: Family Medicine | Admitting: Family Medicine

## 2022-08-15 DIAGNOSIS — M25571 Pain in right ankle and joints of right foot: Secondary | ICD-10-CM

## 2022-08-15 DIAGNOSIS — M7989 Other specified soft tissue disorders: Secondary | ICD-10-CM | POA: Diagnosis not present

## 2022-08-15 DIAGNOSIS — M19071 Primary osteoarthritis, right ankle and foot: Secondary | ICD-10-CM | POA: Diagnosis not present

## 2022-08-15 DIAGNOSIS — M25531 Pain in right wrist: Secondary | ICD-10-CM | POA: Diagnosis not present

## 2022-08-15 DIAGNOSIS — M7731 Calcaneal spur, right foot: Secondary | ICD-10-CM | POA: Diagnosis not present

## 2022-08-15 LAB — C-REACTIVE PROTEIN: CRP: 10 mg/L (ref 0–10)

## 2022-08-15 LAB — URIC ACID: Uric Acid: 5.6 mg/dL (ref 3.0–7.2)

## 2022-08-15 LAB — SEDIMENTATION RATE: Sed Rate: 45 mm/hr — ABNORMAL HIGH (ref 0–40)

## 2022-08-17 ENCOUNTER — Telehealth: Payer: Self-pay

## 2022-08-17 NOTE — Telephone Encounter (Signed)
Patient calls nurse line requesting results of recent imaging.   She asks you call her vs mychart. She reports difficulty getting into her mychart.   Will forward to PCP.

## 2022-08-19 ENCOUNTER — Other Ambulatory Visit: Payer: Self-pay | Admitting: Student

## 2022-08-19 DIAGNOSIS — E785 Hyperlipidemia, unspecified: Secondary | ICD-10-CM

## 2022-08-19 DIAGNOSIS — E119 Type 2 diabetes mellitus without complications: Secondary | ICD-10-CM

## 2022-08-20 ENCOUNTER — Telehealth (INDEPENDENT_AMBULATORY_CARE_PROVIDER_SITE_OTHER): Payer: Medicare HMO | Admitting: Psychiatry

## 2022-08-20 ENCOUNTER — Encounter (HOSPITAL_COMMUNITY): Payer: Self-pay | Admitting: Psychiatry

## 2022-08-20 DIAGNOSIS — F319 Bipolar disorder, unspecified: Secondary | ICD-10-CM | POA: Diagnosis not present

## 2022-08-20 DIAGNOSIS — F411 Generalized anxiety disorder: Secondary | ICD-10-CM

## 2022-08-20 DIAGNOSIS — R69 Illness, unspecified: Secondary | ICD-10-CM | POA: Diagnosis not present

## 2022-08-20 MED ORDER — MELATONIN 5 MG PO CAPS: 5.0000 mg | ORAL_CAPSULE | Freq: Every evening | ORAL | 3 refills | Status: DC | PRN

## 2022-08-20 MED ORDER — TRAZODONE HCL 50 MG PO TABS: ORAL_TABLET | Freq: Every day | ORAL | 3 refills | Status: DC

## 2022-08-20 MED ORDER — HYDROXYZINE HCL 25 MG PO TABS: ORAL_TABLET | Freq: Three times a day (TID) | ORAL | 3 refills | Status: DC | PRN

## 2022-08-20 MED ORDER — CITALOPRAM HYDROBROMIDE 40 MG PO TABS: ORAL_TABLET | Freq: Every day | ORAL | 3 refills | Status: DC

## 2022-08-20 NOTE — Telephone Encounter (Signed)
Lipid panel UTD. 

## 2022-08-20 NOTE — Progress Notes (Signed)
BH MD/PA/NP OP Progress Note Virtual Visit via Video Note  I connected with Amanda Larson on 08/20/22 at 12:30 PM EST by a video enabled telemedicine application and verified that I am speaking with the correct person using two identifiers.  Location: Patient: Home Provider: Clinic   I discussed the limitations of evaluation and management by telemedicine and the availability of in person appointments. The patient expressed understanding and agreed to proceed.  I provided 30 minutes of non-face-to-face time during this encounter.    08/20/2022 12:37 PM Amanda Larson  MRN:  [id number]  Chief Complaint: "Mentally I feel good"    HPI: 56 year old female seen today for follow up psychiatric evaluation. She has a psychiatric history of Bipolar I disorder, depression, and anxiety. She is currently managed on  Celexa 40 mg daily, hydroxyzine 25 mg three times daily, Trazodone 50 mg nightly as needed, and melatonin 5mg  at night. She notes that her medications are effective in managing her psychiatric conditions.    Today she was well groomed, pleasant, cooperative,  engaged in conversation, and maintained eye contact. She informed that mentally she feels food. She reports that she has been spending time with her grandson who she watches 4 days out of the week while his mom attends school. She informed Clinical research associate that her mood continues to be stable without vryalar and notes that she has minimal anxiety and depression. Today  provider conducted GAD-7 and  patient scored a 5, at her last visit she scored an 10.  Provider also conducted PHQ-9 patient scored a 4, at her last visit she scored an 5.  She endorses adequate sleep and appetite. Patient notes that she continues to lose weight while taking ozempic. Patient notes that she has lost over 50 pounds since starting.   Today she denies SI/HI/VAH, mania, or paranoia.  No medication changes made today.  Patient agreeable to continue medication  as prescribed.  No other concerns at this time.  Visit Diagnosis:    ICD-10-CM   1. Generalized anxiety disorder  F41.1 citalopram (CELEXA) 40 MG tablet    hydrOXYzine (ATARAX) 25 MG tablet    2. Bipolar I disorder (HCC)  F31.9 citalopram (CELEXA) 40 MG tablet    traZODone (DESYREL) 50 MG tablet    Melatonin 5 MG CAPS      Past Psychiatric History: depression, anxiety, mood disorder, and PTSD.  Past Medical History:  Past Medical History:  Diagnosis Date   Bronchitis    Chronic back pain    Depression    Diabetes mellitus without complication (HCC)    Osteoarthritis     Past Surgical History:  Procedure Laterality Date   CESAREAN SECTION     CHOLECYSTECTOMY     TUBAL LIGATION      Family Psychiatric History: Daughter ADHD and Bipolar, Daughter ADHD and PTSD, Son ADHD, Grandson ADD and ADHD  Family History:  Family History  Problem Relation Age of Onset   Cancer Father        skin   Heart disease Paternal Grandfather    Stroke Paternal Grandfather    Drug abuse Paternal Grandfather    High Cholesterol Mother    Diabetes Maternal Aunt    Hypertension Maternal Aunt    Diabetes Maternal Uncle     Social History:  Social History   Socioeconomic History   Marital status: Significant Other    Spouse name: Not on file   Number of children: 3   Years of education: Not  on file   Highest education level: Some college, no degree  Occupational History   Not on file  Tobacco Use   Smoking status: Former    Types: Cigarettes    Quit date: 07/31/2007    Years since quitting: 15.0    Passive exposure: Past   Smokeless tobacco: Never  Vaping Use   Vaping Use: Never used  Substance and Sexual Activity   Alcohol use: Yes    Comment: 3 drinks a year    Drug use: Yes    Types: Marijuana   Sexual activity: Yes    Birth control/protection: Surgical  Other Topics Concern   Not on file  Social History Narrative   Not on file   Social Determinants of Health    Financial Resource Strain: Not on file  Food Insecurity: Not on file  Transportation Needs: No Transportation Needs (12/30/2018)   PRAPARE - Administrator, Civil Service (Medical): No    Lack of Transportation (Non-Medical): No  Physical Activity: Not on file  Stress: Not on file  Social Connections: Not on file    Allergies:  Allergies  Allergen Reactions   Sulfa Antibiotics Anaphylaxis and Swelling   Clarithromycin Nausea Only    Metabolic Disorder Labs: Lab Results  Component Value Date   HGBA1C 6.7 07/23/2022   No results found for: "PROLACTIN" Lab Results  Component Value Date   CHOL 116 04/27/2022   TRIG 133 04/27/2022   HDL 35 (L) 04/27/2022   CHOLHDL 3.3 04/27/2022   LDLCALC 57 04/27/2022   LDLCALC 131 (H) 11/04/2020   No results found for: "TSH"  Therapeutic Level Labs: No results found for: "LITHIUM" No results found for: "VALPROATE" No results found for: "CBMZ"  Current Medications: Current Outpatient Medications  Medication Sig Dispense Refill   albuterol (VENTOLIN HFA) 108 (90 Base) MCG/ACT inhaler Inhale 2 puffs into the lungs every 6 (six) hours as needed for wheezing or shortness of breath. 6.7 g 4   citalopram (CELEXA) 40 MG tablet TAKE 1 TABLET (40 MG TOTAL) BY MOUTH DAILY. 30 tablet 3   Diclofenac Sodium 3 % GEL Apply 2 g topically in the morning, at noon, in the evening, and at bedtime. 100 g 2   hydrOXYzine (ATARAX) 25 MG tablet TAKE 1 TABLET (25 MG TOTAL) BY MOUTH 3 (THREE) TIMES DAILY AS NEEDED FOR ANXIETY. 90 tablet 3   Melatonin 5 MG CAPS Take 1 capsule (5 mg total) by mouth at bedtime as needed. 30 capsule 3   meloxicam (MOBIC) 15 MG tablet TAKE 1 TABLET BY MOUTH ONCE DAILY.(APPOINTMENT REQUIRED FOR FUTURE REFILLS) 30 tablet 0   metFORMIN (GLUCOPHAGE-XR) 500 MG 24 hr tablet TAKE 4 TABLETS BY MOUTH DAILY 120 tablet 1   Omega-3 Fatty Acids (FISH OIL) 1000 MG CAPS Take by mouth.     pregabalin (LYRICA) 100 MG capsule Take 1  capsule by mouth twice daily 180 capsule 0   Semaglutide, 1 MG/DOSE, 4 MG/3ML SOPN Inject 2 mg into the skin once a week. 3 mL 1   simvastatin (ZOCOR) 40 MG tablet TAKE 1 TABLET BY MOUTH AT BEDTIME 30 tablet 0   traMADol (ULTRAM) 50 MG tablet Take 1 tablet by mouth once daily 30 tablet 0   traZODone (DESYREL) 50 MG tablet TAKE 1 TABLET (50 MG TOTAL) BY MOUTH AT BEDTIME. 30 tablet 3   No current facility-administered medications for this visit.     Musculoskeletal: Strength & Muscle Tone: within normal limits and Teleheath  visit Gait & Station: Within normal limits telehealth visit visit Patient leans: N/A  Psychiatric Specialty Exam: Review of Systems  Last menstrual period 01/17/2014.There is no height or weight on file to calculate BMI.  General Appearance: Well Groomed  Eye Contact:  Good  Speech:  Clear and Coherent and Normal Rate  Volume:  Normal  Mood:  Euthymic  Affect:  Appropriate and Congruent  Thought Process:  Coherent, Goal Directed and Linear  Orientation:  Full (Time, Place, and Person)  Thought Content: WDL and Logical   Suicidal Thoughts:  No  Homicidal Thoughts:  No  Memory:  Immediate;   Good Recent;   Good Remote;   Good  Judgement:  Good  Insight:  Good  Psychomotor Activity:  Normal  Concentration:  Concentration: Good and Attention Span: Good  Recall:  Good  Fund of Knowledge: Good  Language: Good  Akathisia:  No  Handed:  Right  AIMS (if indicated): Not done  Assets:  Communication Skills Desire for Improvement Financial Resources/Insurance Housing Social Support  ADL's:  Intact  Cognition: WNL  Sleep:  Good   Screenings: GAD-7    Flowsheet Row Video Visit from 08/20/2022 in [facility] Video Visit from 05/25/2022 in [facility] Video Visit from 02/23/2022 in [facility] Video Visit from 09/08/2021 in [facility] Video Visit  from 06/02/2021 in [facility]  Total GAD-7 Score 5 10 8 12 11       PHQ2-9    Flowsheet Row Video Visit from 08/20/2022 in [facility] Office Visit from 08/14/2022 in [facility] Office Visit from 07/23/2022 in [facility] Video Visit from 05/25/2022 in [facility] Office Visit from 04/27/2022 in [facility]  PHQ-2 Total Score 2 2 3 2 2   PHQ-9 Total Score 4 7 6 5 8       Flowsheet Row Clinical Support from 11/14/2020 in [facility]  C-SSRS RISK CATEGORY No Risk        Assessment and Plan: Patient notes that she has been doing well on her current medication regimen.  No medication changes made today.  Patient agreeable to continue medication as prescribed.    1. Bipolar I disorder (HCC)  Continue- citalopram (CELEXA) 40 MG tablet; TAKE 1 TABLET (40 MG TOTAL) BY MOUTH DAILY.  Dispense: 30 tablet; Refill: 3 Continue- melatonin 5 MG TABS; TAKE 1 TABLET (5 MG TOTAL) BY MOUTH AT BEDTIME.  Dispense: 30 tablet; Refill: 3 Continue- traZODone (DESYREL) 50 MG tablet; TAKE 1 TABLET (50 MG TOTAL) BY MOUTH AT BEDTIME.  Dispense: 30 tablet; Refill: 3  2. Generalized anxiety disorder  Continue- citalopram (CELEXA) 40 MG tablet; TAKE 1 TABLET (40 MG TOTAL) BY MOUTH DAILY.  Dispense: 30 tablet; Refill: 3 Continue- hydrOXYzine (ATARAX) 25 MG tablet; TAKE 1 TABLET (25 MG TOTAL) BY MOUTH 3 (THREE) TIMES DAILY AS NEEDED FOR ANXIETY.  Dispense: 90 tablet; Refill: 3    Follow up in 3 months  Follow up with therapy  , NP 08/20/2022, 12:37 PM

## 2022-08-21 ENCOUNTER — Ambulatory Visit (INDEPENDENT_AMBULATORY_CARE_PROVIDER_SITE_OTHER): Payer: Medicare HMO | Admitting: Family Medicine

## 2022-08-21 ENCOUNTER — Ambulatory Visit: Payer: Self-pay

## 2022-08-21 VITALS — BP 140/86 | HR 73 | Ht 61.0 in | Wt 305.0 lb

## 2022-08-21 DIAGNOSIS — G8929 Other chronic pain: Secondary | ICD-10-CM

## 2022-08-21 DIAGNOSIS — M25531 Pain in right wrist: Secondary | ICD-10-CM | POA: Diagnosis not present

## 2022-08-21 DIAGNOSIS — M25571 Pain in right ankle and joints of right foot: Secondary | ICD-10-CM | POA: Diagnosis not present

## 2022-08-21 NOTE — Progress Notes (Unsigned)
I, Amanda Larson, LAT, ATC acting as a scribe for Amanda Graham, MD.  Subjective:    CC: R wrist & R ankle pain  HPI: Pt is a 56 y/o female c/o R wrist and ankle pain.  Pt reports R wrist pain x 6 months. Pt is R-hand dominate. Pt locates pain to the radial aspect of the wrist, into 1st MC, into thumb, and along the 5th MC.  R wrist swelling: yes- 1st and 5th fingers and wrist Grip strength: decreased Paresthesia: no Aggravates: wrist ext, twisting motions, opening a bottle Treatments tried: meloxicam, tramadol, Lyrica, heat, cold, wrist brace  Pt also c/o R ankle pain x 2-3 months. Pt notes a hx of OA and neuropathy. Pt locates pain to all over the talocrural joint and along the lateral aspect.   R ankle swelling: yes Aggravates: increased activity, standing,  Treatments tried: elevation, diclofenac gel,   Dx imaging: 08/15/22 R ankle & R wrist XR   Pertinent review of Systems: No fevers or chills.  Relevant historical information: Diabetes.  Obesity.  Bipolar disorder.  No history of gout.   Objective:    Vitals:   08/21/22 1459  BP: (!) 140/86  Pulse: 73  SpO2: 95%   General: Well Developed, well nourished, and in no acute distress.   MSK: Right wrist mild effusion normal-appearing otherwise.  Decreased range of motion limited extension and flexion.  Tender palpation radial and ulnar dorsal wrist.  Intact strength.  Pulses cap refill and sensation are intact distally.  Right ankle mild effusion Tender palpation along the lateral aspect of the ankle especially posterior lateral ankle along the course of the peroneal tendons. Normal ankle motion. Pain with resisted ankle eversion. Pulses cap refill and sensation are intact distally.  Lab and Radiology Results   Diagnostic Limited MSK Ultrasound of: Right wrist Dorsal wrist shows mild joint effusion.  Mild tenosynovitis of the third and 6 dorsal wrist compartments.  No definitive tear of the dorsal wrist  tendons are visible. Impression: Mild wrist effusion and mild tenosynovitis.  Diagnostic Limited MSK Ultrasound of: Right ankle Medial joint effusion is present.  Posterior tibialis tendon is normal-appearing Lateral ankle shows minimal lateral joint effusion. The peroneal brevis tendon has a linear split tear at around the level of the ankle.  The peroneal longus tendon appears to be intact.  No full-thickness retracted tear is present. Impression: Partial tear peroneal tendon.  Ankle effusion.  EXAM: RIGHT ANKLE - COMPLETE 3+ VIEW   COMPARISON:  None available   FINDINGS: The ankle mortise is symmetric and intact. Mild distal anterior and posterior tibial plafond and adjacent talar degenerative osteophytes. Mild to moderate distal medial malleolar and adjacent talar degenerative osteophytes.   Normal variant os trigonum with associated mild narrowing and peripheral osteophytosis at the synchondrosis.   Large plantar calcaneal heel spur. Mild chronic spurring at the Achilles insertion on the calcaneus.   IMPRESSION: 1. Mild tibiotalar osteoarthritis. 2. Normal variant os trigonum with associated osteoarthritis at the synchondrosis. 3. Large plantar calcaneal heel spur.     Electronically Signed   By: Neita Garnet M.D.   On: 08/16/2022 10:22  EXAM: RIGHT WRIST - COMPLETE 3+ VIEW   COMPARISON:  Right hand radiographs 06/27/2010   FINDINGS: There is diffuse decreased bone mineralization. Neutral ulnar variance. New mild triscaphe and thumb carpometacarpal joint space narrowing and peripheral osteophytosis. Mild volar distal radial peripheral osteophytosis with 3 mm oval apparent degenerative ossicle.   No acute fracture is seen.  No dislocation.   IMPRESSION: Compared to remote 06/27/2010 radiographs:   New mild triscaphe and thumb carpometacarpal osteoarthritis.     Electronically Signed   By: Neita Garnet M.D.   On: 08/16/2022 09:43 I, Amanda Larson,  personally (independently) visualized and performed the interpretation of the images attached in this note.   Lab Results  Component Value Date   LABURIC 5.6 08/14/2022       Impression and Recommendations:    Assessment and Plan: 56 y.o. female with right wrist pain.  Pain thought to be due to exacerbation of arthritis with some tendinitis.  There may be some ligament tearing in the wrist not seen on x-ray or ultrasound that could be evaluated with MRI arthrogram.  We discussed treatment options.  Plan for trial of occupational hand therapy.  If that does not work would consider steroid injection or MRI arthrogram.  Right ankle pain pain predominantly due to the tenosynovitis and partial tendon tear of the peroneal tendon.  She also does have some degenerative changes within the ankle.  However this per my read looks worse at the medial ankle than the lateral ankle and she is not very painful at the medial ankle.  We discussed options.  Again trial of physical therapy.  In general we talked about steroid injections.  She would like to avoid steroids and injections in general if possible.  I think that is a reasonable approach.  .  Recheck in 6 weeks.  PDMP not reviewed this encounter. Orders Placed This Encounter  Procedures   [place] LIMITED JOINT SPACE STRUCTURES UP RIGHT(NO LINKED CHARGES)    Order Specific Question:   Reason for Exam (SYMPTOM  OR DIAGNOSIS REQUIRED)    Answer:   right wrist pain    Order Specific Question:   Preferred imaging location?    Answer:   [facility] referral to Occupational Therapy    Referral Priority:   Routine    Referral Type:   Occupational Therapy    Referral Reason:   Specialty Services Required    Requested Specialty:   Occupational Therapy    Number of Visits Requested:   1   Ambulatory referral to Physical Therapy    Referral Priority:   Routine    Referral Type:   Physical Medicine    Referral Reason:    Specialty Services Required    Requested Specialty:   Physical Therapy    Number of Visits Requested:   1   No orders of the defined types were placed in this encounter.   Discussed warning signs or symptoms. Please see discharge instructions. Patient expresses understanding.   The above documentation has been reviewed and is accurate and complete Amanda Larson, M.D.

## 2022-08-21 NOTE — Patient Instructions (Signed)
Thank you for coming in today.   I've referred you to Physical Therapy.  Let us know if you don't hear from them in one week.   Recheck in 6 weeks.   Let me know sooner if this is not working. I can do injection if needed.   Continue the voltaren gel and meloxicam and lyrica.

## 2022-08-22 ENCOUNTER — Encounter: Payer: Self-pay | Admitting: Student

## 2022-08-28 NOTE — Therapy (Signed)
OUTPATIENT OCCUPATIONAL THERAPY ORTHO EVALUATION  Patient Name: Amanda Larson MRN: [id number] DOB:1966/05/29, 56 y.o., female Today's Date: 08/31/2022  PCP: Alfredo Martinez, MD  REFERRING PROVIDER:  Rodolph Bong, MD    END OF SESSION:  OT End of Session - 08/31/22 1110     Visit Number 1    Number of Visits 16    Date for OT Re-Evaluation 11/09/22    Authorization Type [employer]    Progress Note Due on Visit 10    OT Start Time 1110    OT Stop Time 1150    OT Time Calculation (min) 40 min    Activity Tolerance Patient limited by pain;Patient limited by lethargy;Patient limited by fatigue    Behavior During Therapy [facility] for tasks assessed/performed             Past Medical History:  Diagnosis Date   Bronchitis    Chronic back pain    Depression    Diabetes mellitus without complication (HCC)    Osteoarthritis    Past Surgical History:  Procedure Laterality Date   CESAREAN SECTION     CHOLECYSTECTOMY     TUBAL LIGATION     Patient Active Problem List   Diagnosis Date Noted   Acute right ankle pain 08/14/2022   Right wrist pain 07/24/2022   Pain of toe of right foot 04/27/2022   Type 2 diabetes mellitus with neurological complications (HCC) 01/25/2022   Bipolar I disorder (HCC) 03/30/2020   Recurrent major depressive disorder, in partial remission (HCC) 03/30/2020   Generalized anxiety disorder 03/30/2020   Obstructive sleep apnea 11/21/2018   Osteoarthritis of left knee 05/15/2018   Gastro-esophageal reflux 05/15/2018   Hyperlipidemia 12/10/2017   Type 2 diabetes mellitus with hyperglycemia (HCC) 11/18/2017   Depression 02/08/2017   Chronic low back pain 02/08/2017   Asthma 02/08/2017   Psoriasis 02/08/2017   Healthcare maintenance 02/08/2017    ONSET DATE: ~6 months (June 2023)   REFERRING DIAG: M25.531 (ICD-10-CM) - Right wrist pain   THERAPY DIAG:  Stiffness of right hand, not elsewhere classified  Pain in right hand  Muscle weakness  (generalized)  Localized edema  Stiffness of right wrist, not elsewhere classified  Pain in right wrist  Other lack of coordination  Rationale for Evaluation and Treatment: Rehabilitation  SUBJECTIVE:   SUBJECTIVE STATEMENT: She states having a wrist support that hurts her because it feels to tight, so she is not wearing it and hasn't been wearing it. She states ulnar nerve numbness in Rt hand at night at times, pain from upper/back neck to down shoulder into forearm and hand at times.  Thumb basal joint and wrist crease also painful and tender, but the worst pain in on the ulnar side of her wrist and hand which is also significantly swollen.   PERTINENT HISTORY: Per MD Note: "Pt reports R wrist pain x 6 months. Pt is R-hand dominate. Pt locates pain to the radial aspect of the wrist, into 1st MC, into thumb, and along the 5th [facility]." She is also seeking PT tx for Rt ankle pain for 2-3 months.   PRECAUTIONS: Fall  WEIGHT BEARING RESTRICTIONS: No  PAIN:  Are you having pain? Yes in Rt wrist and foot Rating: 8/10 at rest now, up to 10/10 at worst in past week. She is more stiff and painful in mornings.    FALLS: Has patient fallen in last 6 months? Yes. Number of falls fell once in July at the pool onto  L shoulder  LIVING ENVIRONMENT: Lives with: lives with their family, lives with their spouse, and grand children, daughter Has following equipment at home: Single point cane  PLOF: Independent with basic ADLs, Independent with community mobility with device, and Needs assistance with homemaking  PATIENT GOALS:  decrease pain in rt dominant hand/arm   OBJECTIVE: (All objective assessments below are from initial evaluation on: 08/31/22 unless otherwise specified.)   HAND DOMINANCE: Right   ADLs: Overall ADLs: States decreased ability to grab, hold household objects, pain and inability to open containers, perform FMS tasks (manipulate fasteners on clothing), mild to moderate  bathing problems as well.    FUNCTIONAL OUTCOME MEASURES: Eval: Patient Rated Wrist Evaluation (PRWE): Pain: 46/50; Function: 42.5/50; Total Score: 88.5/100 (Higher Score  =  More Pain and/or Debility)    UPPER EXTREMITY ROM    At eval she she showed poor rounded shoulder postures, noticeably limited shoulder range of motion as well as wrist and hand range of motion on the right side.  Not all details could be measured today due to severity and number of complaints.  Details to be determined next session.  Shoulder to Wrist AROM Right TBD  Shoulder flexion   Shoulder abduction   Shoulder extension   Shoulder internal rotation   Shoulder external rotation   Elbow flexion   Elbow extension   Forearm supination   Forearm pronation    Wrist flexion   Wrist extension   Wrist ulnar deviation   Wrist radial deviation   (Blank rows = not tested)   Hand AROM Right eval  Full Fist Ability (or Gap to Distal Palmar Crease) No- ~1cm gap from digits 2-4, and 2cm gap digit 5  Thumb Opposition  (Kapandji Scale)  5  Thumb MCP (0-60) 0-45  Thumb IP (0-80) 0-37  Thumb Palmar Abduction Span 5.5 cm span  Little MCP (0-90) 0 -  54  Little PIP (0-100) (-37) -  57  Little DIP (0-70)  (-6) - 22  (Blank rows = not tested)   UPPER EXTREMITY MMT:    This was not tested at evaluation due to acute and chronic pains and limited time available.  Details to be determined, but at the wrist and hand she was grossly 3-5 MMT due to apparent limited motion  MMT Right TBD  Shoulder flexion   Shoulder abduction   Shoulder adduction   Shoulder extension   Shoulder internal rotation   Shoulder external rotation   Middle trapezius   Lower trapezius   Elbow flexion   Elbow extension   Forearm supination   Forearm pronation   Wrist flexion   Wrist extension   Wrist ulnar deviation   Wrist radial deviation   (Blank rows = not tested)  HAND FUNCTION: Eval: Observed weakness in affected Rt hand.   Grip strength Right: TBD lbs, Left: TBD lbs   COORDINATION: Eval: Observed coordination impairments with affected Rt hand. Box and Blocks Test: TBD Blocks today (TBD is WFL)  SENSATION: Eval:  Light touch intact today, though median nerve had 15mm static 2-pt discrimination and ulnar tested 63mm.   She was positive Tinel test at elbow (for ulnar) and guyon's canal.   EDEMA:   Eval:  moderate swelling in Rt SF today-  6.9cm circumferentially around Rt SF base (compared to 6.2 opposite hand)   COGNITION: Eval: Overall cognitive status: WFL for evaluation today   OBSERVATIONS:   Eval: She presents as poor shoulder and neck postures, potential ulnar nerve  entrapment/inflammation "handle bar palsy" from cane use and "double crush" in ulnar nerve at shoulder and elbow as well; also EDC tenosynovitis, DeQuervain's Tenosynovitis and thumb basal joint arthritis.  The fact that she holds her cane in her right hand, applying pressure and leaning on the side is obviously exacerbating all of these issues.   TODAY'S TREATMENT:  Post-evaluation treatment: OT advises her to do active shoulder and scapular retraction 3-4 times a day for 5 seconds squeezes 5-10 times to help with poor shoulder postures.  OT also begins education on nerve entrapment at the elbow at night on the ulnar nerve.  Also ulnar nerve at Guyon's canal being damaged potentially by the handle of the cane and applying excessive pressure.  OT advises her to switch hands with her cane.  OT also advises, for her safety and self-care, to avoid weightbearing repetitive gripping and motion at the right hand and wrist for now, as able.  She states understanding of these things will be difficult for her due to multiple severe comorbidities and lower body problems.  These ideas will need reviewed and she will most likely need orthotic support as well as a rigorous home exercise program.  She states being aware of this and the fact that it will likely  take 6 to 10 weeks to help with these more chronic problems   PATIENT EDUCATION: Education details: See tx section above for details  Person educated: Patient Education method: Verbal Instruction, Teach back, Handouts  Education comprehension: States and demonstrates understanding, Additional Education required    HOME EXERCISE PROGRAM: See tx section above for details    GOALS: Goals reviewed with patient? Yes   SHORT TERM GOALS: (STG required if POC>30 days) Target Date: 09/14/22  Pt will obtain protective, custom orthotic. Goal status: INITIAL  2.  Pt will demo/state understanding of initial HEP to improve pain levels and prerequisite motion. Goal status: INITIAL   LONG TERM GOALS: Target Date: 11/09/22  Pt will improve functional ability by decreased impairment per PRWE assessment from 88.5/100 to 35/100 or better, for better quality of life. Goal status: INITIAL  2.  Pt will improve grip strength in Rt hand to at least non-painful 30lbs for functional use at home and in IADLs. Goal status: INITIAL  3.  Pt will improve A/ROM in Rt SF TAM from 90* to at least 180*, to improve functional motion for tasks like reach and grasp.  Goal status: INITIAL  4.  Pt will improve strength in Rt wrist extension from painful 3-/5 MMT to at least 4/5 MMT to have increased functional ability to carry out selfcare and higher-level homecare tasks with no difficulty. Goal status: INITIAL  5.  Pt will improve coordination skills in Rt arm, as seen by [facility] score on Box and Blocks testing to have increased functional ability to carry out fine motor tasks (fasteners, etc.) and more complex, coordinated IADLs (meal prep, sports, etc.).  Goal status: INITIAL  6.  Pt will decrease pain at rest from 8/10 to 3/10 or better to have better sleep and occupational participation in daily roles. Goal status: INITIAL   ASSESSMENT:  CLINICAL IMPRESSION: Patient is a 56 y.o. female who was seen today  for occupational therapy evaluation for painful right hand and wrist, stiffness, weakness, tenosynovitis, ulnar nerve compression, arthritis, and decreased functional mobility.  She will benefit from outpatient occupational therapy to improve function and quality of life.  She had numerous chronic and acute complaints and comorbidities, and arrived slightly  late for evaluation, which limited the evaluation and the treatment that could be done on this visit.   PERFORMANCE DEFICITS: in functional skills including IADLs, coordination, dexterity, sensation, ROM, strength, pain, flexibility, Fine motor control, Gross motor control, body mechanics, endurance, and UE functional use, cognitive skills including problem solving and safety awareness, and psychosocial skills including environmental adaptation, habits, and routines and behaviors.   IMPAIRMENTS: are limiting patient from IADLs, work, leisure, and social participation.   COMORBIDITIES: has co-morbidities such as depression, chronic low back pain, asthma, Lt knee OA, OSA, DM II, and others  that affects occupational performance. Patient will benefit from skilled OT to address above impairments and improve overall function.  MODIFICATION OR ASSISTANCE TO COMPLETE EVALUATION: Min-Moderate modification of tasks or assist with assess necessary to complete an evaluation.  OT OCCUPATIONAL PROFILE AND HISTORY: Detailed assessment: Review of records and additional review of physical, cognitive, psychosocial history related to current functional performance.  CLINICAL DECISION MAKING: High - multiple treatment options, significant modification of task necessary  REHAB POTENTIAL: Good  EVALUATION COMPLEXITY: Moderate      PLAN:  OT FREQUENCY: 2x/week  OT DURATION: 10 weeks (through 11/09/22 as needed)   PLANNED INTERVENTIONS: self care/ADL training, therapeutic exercise, therapeutic activity, neuromuscular re-education, manual therapy, passive range  of motion, splinting, electrical stimulation, ultrasound, fluidotherapy, compression bandaging, moist heat, cryotherapy, contrast bath, patient/family education, and coping strategies training  RECOMMENDED OTHER SERVICES: She is also seeing PT for LE pain  CONSULTED AND AGREED WITH PLAN OF CARE: Patient  PLAN FOR NEXT SESSION:  Review initial recommendations and HEP, fabricate right wrist orthosis this supports the thumb basal joint as well as her wrist she will allow tenosynovitis and arthritis to rest, heal, decrease pain.  Assign more rigorous home exercise program, finish upper body range of motion detailed measures, do box and blocks testing.  Fannie KneeNathanael Cyd Hostler, OTR/L, CHT 08/31/2022, 12:15 PM

## 2022-08-29 ENCOUNTER — Other Ambulatory Visit: Payer: Self-pay | Admitting: Student

## 2022-08-29 DIAGNOSIS — M545 Low back pain, unspecified: Secondary | ICD-10-CM

## 2022-08-31 ENCOUNTER — Encounter: Payer: Self-pay | Admitting: Rehabilitative and Restorative Service Providers"

## 2022-08-31 ENCOUNTER — Ambulatory Visit: Payer: Medicare HMO | Admitting: Rehabilitative and Restorative Service Providers"

## 2022-08-31 ENCOUNTER — Other Ambulatory Visit: Payer: Self-pay

## 2022-08-31 DIAGNOSIS — M6281 Muscle weakness (generalized): Secondary | ICD-10-CM | POA: Diagnosis not present

## 2022-08-31 DIAGNOSIS — R262 Difficulty in walking, not elsewhere classified: Secondary | ICD-10-CM | POA: Diagnosis not present

## 2022-08-31 DIAGNOSIS — M25671 Stiffness of right ankle, not elsewhere classified: Secondary | ICD-10-CM

## 2022-08-31 DIAGNOSIS — M79641 Pain in right hand: Secondary | ICD-10-CM

## 2022-08-31 DIAGNOSIS — M25571 Pain in right ankle and joints of right foot: Secondary | ICD-10-CM | POA: Diagnosis not present

## 2022-08-31 DIAGNOSIS — R600 Localized edema: Secondary | ICD-10-CM

## 2022-08-31 DIAGNOSIS — M25641 Stiffness of right hand, not elsewhere classified: Secondary | ICD-10-CM | POA: Diagnosis not present

## 2022-08-31 DIAGNOSIS — M25531 Pain in right wrist: Secondary | ICD-10-CM | POA: Diagnosis not present

## 2022-08-31 DIAGNOSIS — R278 Other lack of coordination: Secondary | ICD-10-CM | POA: Diagnosis not present

## 2022-08-31 DIAGNOSIS — M25631 Stiffness of right wrist, not elsewhere classified: Secondary | ICD-10-CM

## 2022-08-31 NOTE — Therapy (Signed)
OUTPATIENT PHYSICAL THERAPY LOWER EXTREMITY EVALUATION   Patient Name: Amanda Larson MRN: [id number] DOB:1966-07-21, 56 y.o., female Today's Date: 08/31/2022  END OF SESSION:  PT End of Session - 08/31/22 1608     Visit Number 1    Number of Visits 16    PT Start Time 1150    PT Stop Time 1231    PT Time Calculation (min) 41 min    Activity Tolerance Patient tolerated treatment well;No increased pain;Patient limited by fatigue    Behavior During Therapy [facility] for tasks assessed/performed             Past Medical History:  Diagnosis Date   Bronchitis    Chronic back pain    Depression    Diabetes mellitus without complication (HCC)    Osteoarthritis    Past Surgical History:  Procedure Laterality Date   CESAREAN SECTION     CHOLECYSTECTOMY     TUBAL LIGATION     Patient Active Problem List   Diagnosis Date Noted   Acute right ankle pain 08/14/2022   Right wrist pain 07/24/2022   Pain of toe of right foot 04/27/2022   Type 2 diabetes mellitus with neurological complications (HCC) 01/25/2022   Bipolar I disorder (HCC) 03/30/2020   Recurrent major depressive disorder, in partial remission (HCC) 03/30/2020   Generalized anxiety disorder 03/30/2020   Obstructive sleep apnea 11/21/2018   Osteoarthritis of left knee 05/15/2018   Gastro-esophageal reflux 05/15/2018   Hyperlipidemia 12/10/2017   Type 2 diabetes mellitus with hyperglycemia (HCC) 11/18/2017   Depression 02/08/2017   Chronic low back pain 02/08/2017   Asthma 02/08/2017   Psoriasis 02/08/2017   Healthcare maintenance 02/08/2017    PCP: Alfredo Martinez, MD  REFERRING PROVIDER: Rodolph Bong, MD  REFERRING DIAG:  Diagnosis  M25.571,G89.29 (ICD-10-CM) - Chronic pain of right ankle    THERAPY DIAG:  Difficulty in walking, not elsewhere classified - Plan: PT plan of care cert/re-cert  Muscle weakness (generalized) - Plan: PT plan of care cert/re-cert  Stiffness of right ankle, not elsewhere  classified - Plan: PT plan of care cert/re-cert  Pain in right ankle and joints of right foot - Plan: PT plan of care cert/re-cert  Rationale for Evaluation and Treatment: Rehabilitation  ONSET DATE: Chronic  SUBJECTIVE:   SUBJECTIVE STATEMENT: Amanda Larson notes chronic right ankle pain along with right hand, low back and left knee pain.  She has been advised not to use the cane in her right hand any longer to help calm down multiple tendonitis problems in the right hand.  She would like to be able to stand and walk with better endurance and less pain to complete normal activities around her house.  Prolonged standing bothers Lt knee and Rt ankle.  Uses a "buggy" at [employer] or the grocery store to avoid standing for too long.  PERTINENT HISTORY: Bronchitis, chronic back pain, depression, diabetes, osteoarthritis PAIN:  Are you having pain? Yes: NPRS scale: 4-9/10 Pain location: Right ankle, deep in the joint and lateral ankle Pain description: Ache, sore and can be sharp Aggravating factors: Prolonged standing and walking Relieving factors: Rest  PRECAUTIONS: None  WEIGHT BEARING RESTRICTIONS: No  FALLS:  Has patient fallen in last 6 months? No  LIVING ENVIRONMENT: Lives with: lives with their family Lives in: House/apartment Stairs:  Avoids stairs Has following equipment at home: Single point cane  OCCUPATION: Disabled, tries to take care of the house, has trouble with certain chores due to wrist  PLOF:  Independent with household mobility with device  PATIENT GOALS: Be able to be more independent with housework and have better standing and walking endurance with less right ankle pain  NEXT MD VISIT:   OBJECTIVE:   DIAGNOSTIC FINDINGS: IMPRESSION: 1. Mild tibiotalar osteoarthritis. 2. Normal variant os trigonum with associated osteoarthritis at the synchondrosis. 3. Large plantar calcaneal heel spur.  PATIENT SURVEYS:  FOTO Next visit (late start at  evaluation)  COGNITION: Overall cognitive status: Within functional limits for tasks assessed     SENSATION: Amanda Larson did not note any peripheral tingling or paresthesias   LOWER EXTREMITY ROM:  Active ROM Right eval Left eval  Hip flexion    Hip extension    Hip abduction    Hip adduction    Hip internal rotation    Hip external rotation    Knee flexion    Knee extension    Ankle dorsiflexion -8 -8  Ankle plantarflexion    Ankle inversion    Ankle eversion     (Blank rows = not tested)  LOWER EXTREMITY MMT:  MMT Left/Right in pounds 08/31/2022   Hip flexion    Hip extension    Hip abduction    Hip adduction    Hip internal rotation    Hip external rotation    Knee flexion    Knee extension    Ankle dorsiflexion    Ankle plantarflexion    Ankle inversion 10.4/7.9    Ankle eversion 15.1/13.5    (Blank rows = not tested)  GAIT: Distance walked: Within the clinic Assistive device utilized: Single point cane Level of assistance: Complete Independence Comments: Amanda Larson was advised to use the cane in her left hand to help calm down right hand tendinitis and to more adequately unweight her right ankle   TODAY'S TREATMENT:                                                                                                                              DATE: 08/31/2022 Quadriceps sets with pillow under both knees, encouraged bilateral ankle dorsiflexion 2 sets of 10 for 5 seconds (50+ per day recommended)   PATIENT EDUCATION:  Education details: We discussed keeping the cane in the left hand to unweight her right ankle and also help reduce overuse of the right hand Person educated: Patient Education method: Explanation, Demonstration, Tactile cues, Verbal cues, and Handouts Education comprehension: verbalized understanding, returned demonstration, verbal cues required, tactile cues required, and needs further education  HOME EXERCISE PROGRAM: Access Code:  [email or web address] URL: [email or web address] Date: 08/31/2022 Prepared by: Pauletta Browns  Exercises - Supine Quadricep Sets  - 3-5 x daily - 7 x weekly - 1-2 sets - 10 reps - 5 second hold  ASSESSMENT:  CLINICAL IMPRESSION: Patient is a 56 y.o. female who was seen today for physical therapy evaluation and treatment for  Diagnosis  M25.571,G89.29 (ICD-10-CM) - Chronic pain of right ankle  .  Mikayla also has chronic left  knee pain, right hand tendonitis and chronic low back pain.  Weight loss would also benefit Darleene as her BMI is significantly above normal.  Physical therapy will focus on improving bilateral heel cord flexibility, overall ankle, knee and lower extremity strength while avoiding overuse of both lower extremities and the right hand.  Renae Gloss will probably be a slower rehabilitation although her prognosis is good if attendance and participation are as recommended.  OBJECTIVE IMPAIRMENTS: Abnormal gait, cardiopulmonary status limiting activity, decreased activity tolerance, decreased coordination, decreased endurance, decreased knowledge of condition, decreased mobility, difficulty walking, decreased ROM, decreased strength, decreased safety awareness, increased edema, impaired perceived functional ability, impaired flexibility, obesity, and pain.   ACTIVITY LIMITATIONS: carrying, lifting, bending, standing, squatting, stairs, bed mobility, and locomotion level  PARTICIPATION LIMITATIONS: meal prep, cleaning, laundry, shopping, and community activity  PERSONAL FACTORS: Bronchitis, chronic back pain, depression, diabetes, osteoarthritis are also affecting patient's functional outcome.   REHAB POTENTIAL: Good  CLINICAL DECISION MAKING: Stable/uncomplicated  EVALUATION COMPLEXITY: Low   GOALS: Goals reviewed with patient? Yes  SHORT TERM GOALS: Target date: 09/28/2022 Improve bilateral heel cords flexibility to 0 degrees Baseline: 8 degrees of plantarflexion  bilaterally Goal status: INITIAL  2.  Improve bilateral ankle strength to at least 20 pounds inversion and eversion Baseline: 7 to 15 pounds Goal status: INITIAL  3.  Zophia will be comfortable with using her cane in her left hand to unweight her right ankle and allow her right hand to break to help calm tendonitis Baseline: Using cane in the right hand Goal status: INITIAL   LONG TERM GOALS: Target date: 11/24/2022  Improve FOTO to long-term goal Baseline: To be established next visit as she started late today after her OT appointment Goal status: INITIAL  2.  Improve right ankle pain to consistently less than 4 out of 10 on the visual analog scale Baseline: Can be 9 out of 10 Goal status: INITIAL  3.  Improve bilateral ankle dorsiflexion active range of motion to 10 degrees Baseline: -8 degrees Goal status: INITIAL  4.  Improve bilateral ankle strength for inversion and eversion to at least 40 pounds Baseline: 15 pounds or less Goal status: INITIAL  5.  Bani will report improved standing and walking endurance Baseline: Poor standing and walking endurance at evaluation Goal status: INITIAL  6.  Christinna will be independent with a long-term home exercise program at discharge Baseline: Started 08/31/2022 Goal status: INITIAL   PLAN:  PT FREQUENCY: 1x/week  PT DURATION: 12 weeks  PLANNED INTERVENTIONS: Therapeutic exercises, Therapeutic activity, Neuromuscular re-education, Balance training, Gait training, Patient/Family education, Self Care, Stair training, and Cryotherapy  PLAN FOR NEXT SESSION: Review quads quadriceps sets with bilateral ankle dorsiflexion and a pillow underneath both knees, consider balance training for ankle, knee and hip strengthening, consider general lower extremity strengthening with emphasis on the ankle and quadriceps.   Cherlyn Cushing, PT, MPT 08/31/2022, 4:28 PM

## 2022-09-01 ENCOUNTER — Other Ambulatory Visit: Payer: Self-pay | Admitting: Student

## 2022-09-06 NOTE — Therapy (Signed)
OUTPATIENT OCCUPATIONAL THERAPY TREATMENT NOTE  Patient Name: Amanda Larson MRN: [id number] DOB:December 13, 1965, 55 y.o., female Today's Date: 09/11/2022  PCP: Erskine Emery, MD  REFERRING PROVIDER:  Gregor Hams, MD    END OF SESSION:  OT End of Session - 09/11/22 1336     Visit Number 2    Number of Visits 16    Date for OT Re-Evaluation 11/09/22    Authorization Type [employer]    Progress Note Due on Visit 10    OT Start Time 1338    OT Stop Time 1431    OT Time Calculation (min) 53 min    Equipment Utilized During Treatment orthotic materials    Activity Tolerance Patient limited by pain;Patient limited by lethargy;Patient limited by fatigue    Behavior During Therapy [facility] for tasks assessed/performed              Past Medical History:  Diagnosis Date   Bronchitis    Chronic back pain    Depression    Diabetes mellitus without complication ([place])    Osteoarthritis    Past Surgical History:  Procedure Laterality Date   CESAREAN SECTION     CHOLECYSTECTOMY     TUBAL LIGATION     Patient Active Problem List   Diagnosis Date Noted   Acute right ankle pain 08/14/2022   Right wrist pain 07/24/2022   Pain of toe of right foot 04/27/2022   Type 2 diabetes mellitus with neurological complications ([place]) 89/21/1941   Bipolar I disorder ([place]) 03/30/2020   Recurrent major depressive disorder, in partial remission ([place]) 03/30/2020   Generalized anxiety disorder 03/30/2020   Obstructive sleep apnea 11/21/2018   Osteoarthritis of left knee 05/15/2018   Gastro-esophageal reflux 05/15/2018   Hyperlipidemia 12/10/2017   Type 2 diabetes mellitus with hyperglycemia ([place]) 11/18/2017   Depression 02/08/2017   Chronic low back pain 02/08/2017   Asthma 02/08/2017   Psoriasis 02/08/2017   Healthcare maintenance 02/08/2017    ONSET DATE: ~6 months (June 2023)   REFERRING DIAG: M25.531 (ICD-10-CM) - Right wrist pain   THERAPY DIAG:  Other lack of  coordination  Localized edema  Stiffness of right wrist, not elsewhere classified  Pain in right wrist  Stiffness of right hand, not elsewhere classified  Pain in right hand  Rationale for Evaluation and Treatment: Rehabilitation  PERTINENT HISTORY: Per MD Note: "Pt reports R wrist pain x 6 months. Pt is R-hand dominate. Pt locates pain to the radial aspect of the wrist, into 1st MC, into thumb, and along the 5th [facility]." She is also seeking PT tx for Rt ankle pain for 2-3 months.  Per OT eval: "states having a wrist support that hurts her because it feels to tight, so she is not wearing it and hasn't been wearing it. She states ulnar nerve numbness in Rt hand at night at times, pain from upper/back neck to down shoulder into forearm and hand at times.  Thumb basal joint and wrist crease also painful and tender, but the worst pain in on the ulnar side of her wrist and hand which is also significantly swollen. "   PRECAUTIONS: Fall;  WEIGHT BEARING RESTRICTIONS: No   SUBJECTIVE:   SUBJECTIVE STATEMENT: She states her hand" draws up" in the morning, very painful but once she gets it going, it moves better.  She is using cane in Lt hand now, well.    PAIN:  Are you having pain?  Yes in Rt wrist and foot Rating:  7/10 at rest now, up to 10/10 at worst in past week. She is more stiff and painful in mornings.    FALLS: Has patient fallen in last 6 months? Yes. Number of falls fell once in July at the pool onto L shoulder  PATIENT GOALS:  decrease pain in rt dominant hand/arm   OBJECTIVE: (All objective assessments below are from initial evaluation on: 08/31/22 unless otherwise specified.)   HAND DOMINANCE: Right   ADLs: Overall ADLs: States decreased ability to grab, hold household objects, pain and inability to open containers, perform FMS tasks (manipulate fasteners on clothing), mild to moderate bathing problems as well.    FUNCTIONAL OUTCOME MEASURES: Eval: Patient Rated Wrist  Evaluation (PRWE): Pain: 46/50; Function: 42.5/50; Total Score: 88.5/100 (Higher Score  =  More Pain and/or Debility)    UPPER EXTREMITY ROM    At eval she she showed poor rounded shoulder postures, noticeably limited shoulder range of motion as well as wrist and hand range of motion on the right side.  Not all details could be measured today due to severity and number of complaints.  Details to be determined next session.  Shoulder to Wrist AROM Right 09/11/22  Shoulder flexion 118  Shoulder abduction 91  Shoulder extension 36  Shoulder internal rotation Pants line  Shoulder external rotation 70  Elbow flexion 146  Elbow extension 0  Forearm supination 38  Forearm pronation  85  Wrist flexion 35  Wrist extension 30  Wrist ulnar deviation 15  Wrist radial deviation 21  (Blank rows = not tested)   Hand AROM Right eval  Full Fist Ability (or Gap to Distal Palmar Crease) No- ~1cm gap from digits 2-4, and 2cm gap digit 5  Thumb Opposition  (Kapandji Scale)  5  Thumb MCP (0-60) 0-45  Thumb IP (0-80) 0-37  Thumb Palmar Abduction Span 5.5 cm span  Little MCP (0-90) 0 -  54  Little PIP (0-100) (-37) -  57  Little DIP (0-70)  (-6) - 22  (Blank rows = not tested)   UPPER EXTREMITY MMT:    This was not tested at evaluation due to acute and chronic pains and limited time available.  Details to be determined, but at the wrist and hand she was grossly 3-5 MMT due to apparent limited motion  MMT Right TBD  Shoulder flexion   Shoulder abduction   Shoulder adduction   Shoulder extension   Shoulder internal rotation   Shoulder external rotation   Middle trapezius   Lower trapezius   Elbow flexion   Elbow extension   Forearm supination   Forearm pronation   Wrist flexion   Wrist extension   Wrist ulnar deviation   Wrist radial deviation   (Blank rows = not tested)  HAND FUNCTION: Eval: Observed weakness in affected Rt hand.  Grip strength Right: TBD lbs, Left: TBD lbs    COORDINATION: 09/11/22: Box and Blocks Test: RIGHT 42 Blocks today (56 is WFL)   SENSATION: Eval:  Light touch intact today, though median nerve had 6mm static 2-pt discrimination and ulnar tested 4mm.   She was positive Tinel test at elbow (for ulnar) and guyon's canal.   EDEMA:   Eval:  moderate swelling in Rt SF today-  6.9cm circumferentially around Rt SF base (compared to 6.2 opposite hand)   COGNITION: Eval: Overall cognitive status: WFL for evaluation today   OBSERVATIONS:   Eval: She presents as poor shoulder and neck postures, potential ulnar nerve entrapment/inflammation "handle bar   palsy" from cane use and "double crush" in ulnar nerve at shoulder and elbow as well; also EDC tenosynovitis, DeQuervain's Tenosynovitis and thumb basal joint arthritis.  The fact that she holds her cane in her right hand, applying pressure and leaning on the side is obviously exacerbating all of these issues.   TODAY'S TREATMENT:  09/11/22: Custom orthotic fabrication was indicated due to pt's painful Rt arm tendonitis, arthritis and other issues and need for safe, functional positioning. OT fabricated custom forearm based thumb spica orthotic for pt today to rest and immobilize Rt wrist, thumb. It fit well with no areas of pressure, pt states a comfortable fit. Pt was educated on the wearing schedule, to call or come in ASAP if it is causing any irritation or is not achieving desired function. It will be checked/adjusted in upcoming sessions, as needed. Pt states understanding.   She performs active range of motion for full measurements from shoulder to hand on the right side.  Indeed she does show deficits weakness and pain.  OT also reviews initial recommendations and HEP- providing a comprehensive print-out of all exercises now (as below). Due to limitations in time, only the bolded could be educated on/performed with her today. She also performs box and blocks testing showing impairment in functional  ability with the right arm, having soreness and pain in her shoulder from moving very light objects for only 1 minute (shows decreased function, decreased range of motion, decreased functional endurance).  Exercises - Seated Scapular Retraction  - 4 x daily - 5-10 reps - Seated Shoulder Flexion Towel Slide at Table Top  - 4 x daily - 3-5 reps - 15 hold - Seated Shoulder External Rotation PROM on Table  - 3-4 x daily - 3-5 reps - 15 sec hold - Standing Shoulder Internal Rotation AAROM with Dowel  - 4-6 x daily - 1 sets - 10-15 reps - Standing Shoulder Extension ROM with Dowel  - 4 x daily - 10-15 reps - Forearm Supination Stretch  - 3-4 x daily - 3-5 reps - 15 sec hold - Bend and Pull Back Wrist SLOWLY  - 4 x daily - 10-15 reps - Wrist Flexion Stretch  - 4 x daily - 3-5 reps - 15 sec hold - Wrist Prayer Stretch  - 4 x daily - 3-5 reps - 15 sec hold - Seated Wrist Ulnar Deviation Stretch  - 3-5 x daily - 3-5 reps - 15 sec hold - BACK KNUCKLE STRETCHES   - 4 x daily - 3-5 reps - 15 sec hold - Thumb stretch  - 4 x daily - 3-5 reps - 15-20 sec hold - Ulnar Nerve Flossing  - 3-4 x daily - 1-2 sets - 5-10 reps  PATIENT EDUCATION: Education details: See tx section above for details  Person educated: Patient Education method: Verbal Instruction, Teach back, Handouts  Education comprehension: States and demonstrates understanding, Additional Education required    HOME EXERCISE PROGRAM: Access Code: [email or web address] URL: [email or web address] Date: 09/11/2022 Prepared by:     GOALS: Goals reviewed with patient? Yes   SHORT TERM GOALS: (STG required if POC>30 days) Target Date: 09/14/22  Pt will obtain protective, custom orthotic. Goal status: 09/11/22 MET 2.  Pt will demo/state understanding of initial HEP to improve pain levels and prerequisite motion. Goal status: Progressing   LONG TERM GOALS: Target Date: 11/09/22  Pt will improve functional ability by decreased  impairment per PRWE assessment from 88.5/100 to 35/100 or better, for better quality of life.   Goal status: INITIAL  2.  Pt will improve grip strength in Rt hand to at least non-painful 30lbs for functional use at home and in IADLs. Goal status: INITIAL  3.  Pt will improve A/ROM in Rt SF TAM from 90* to at least 180*, to improve functional motion for tasks like reach and grasp.  Goal status: INITIAL  4.  Pt will improve strength in Rt wrist extension from painful 3-/5 MMT to at least 4/5 MMT to have increased functional ability to carry out selfcare and higher-level homecare tasks with no difficulty. Goal status: INITIAL  5.  Pt will improve coordination skills in Rt arm, as seen by [facility] score on Box and Blocks testing to have increased functional ability to carry out fine motor tasks (fasteners, etc.) and more complex, coordinated IADLs (meal prep, sports, etc.).  Goal status: INITIAL  6.  Pt will decrease pain at rest from 8/10 to 3/10 or better to have better sleep and occupational participation in daily roles. Goal status: INITIAL   ASSESSMENT:  CLINICAL IMPRESSION: 09/11/22: We will continue plan of care to see if orthotic helps heal painful right wrist and arm, review exercises to help with the shoulder and posture issues, finished educating on wrist and hand stretches as needed.  Eventually forearm orthotic could be transition to hand-based thumb brace as needed for longer-term management of thumb arthritis.   PLAN:  OT FREQUENCY: 2x/week  OT DURATION: 10 weeks (through 11/09/22 as needed)   PLANNED INTERVENTIONS: self care/ADL training, therapeutic exercise, therapeutic activity, neuromuscular re-education, manual therapy, passive range of motion, splinting, electrical stimulation, ultrasound, fluidotherapy, compression bandaging, moist heat, cryotherapy, contrast bath, patient/family education, and coping strategies training  RECOMMENDED OTHER SERVICES: She is also seeing PT for  LE pain  CONSULTED AND AGREED WITH PLAN OF CARE: Patient  PLAN FOR NEXT SESSION:  Check new orthotic as needed, review exercise program as taught so far, educated on the remaining portions of the home exercise program as tolerated for the forearm and wrist.   Benito Mccreedy, OTR/L, CHT 09/11/2022, 2:40 PM

## 2022-09-07 ENCOUNTER — Ambulatory Visit: Payer: Medicare HMO | Admitting: Rehabilitative and Restorative Service Providers"

## 2022-09-09 ENCOUNTER — Other Ambulatory Visit: Payer: Self-pay | Admitting: Student

## 2022-09-09 DIAGNOSIS — G8929 Other chronic pain: Secondary | ICD-10-CM

## 2022-09-11 ENCOUNTER — Ambulatory Visit: Payer: Medicare HMO | Admitting: Rehabilitative and Restorative Service Providers"

## 2022-09-11 DIAGNOSIS — R600 Localized edema: Secondary | ICD-10-CM | POA: Diagnosis not present

## 2022-09-11 DIAGNOSIS — M79641 Pain in right hand: Secondary | ICD-10-CM | POA: Diagnosis not present

## 2022-09-11 DIAGNOSIS — R278 Other lack of coordination: Secondary | ICD-10-CM | POA: Diagnosis not present

## 2022-09-11 DIAGNOSIS — M25531 Pain in right wrist: Secondary | ICD-10-CM

## 2022-09-11 DIAGNOSIS — M25641 Stiffness of right hand, not elsewhere classified: Secondary | ICD-10-CM

## 2022-09-11 DIAGNOSIS — M25631 Stiffness of right wrist, not elsewhere classified: Secondary | ICD-10-CM | POA: Diagnosis not present

## 2022-09-12 ENCOUNTER — Telehealth: Payer: Self-pay

## 2022-09-12 ENCOUNTER — Encounter: Payer: Self-pay | Admitting: Rehabilitative and Restorative Service Providers"

## 2022-09-12 ENCOUNTER — Ambulatory Visit: Payer: Medicare HMO | Admitting: Rehabilitative and Restorative Service Providers"

## 2022-09-12 DIAGNOSIS — M25571 Pain in right ankle and joints of right foot: Secondary | ICD-10-CM | POA: Diagnosis not present

## 2022-09-12 DIAGNOSIS — M25671 Stiffness of right ankle, not elsewhere classified: Secondary | ICD-10-CM

## 2022-09-12 DIAGNOSIS — R262 Difficulty in walking, not elsewhere classified: Secondary | ICD-10-CM

## 2022-09-12 DIAGNOSIS — M6281 Muscle weakness (generalized): Secondary | ICD-10-CM

## 2022-09-12 MED ORDER — OZEMPIC (2 MG/DOSE) 8 MG/3ML ~~LOC~~ SOPN: 2.0000 mg | PEN_INJECTOR | SUBCUTANEOUS | 3 refills | Status: AC

## 2022-09-12 NOTE — Telephone Encounter (Signed)
Received call from pharmacist regarding Ozempic dosage. Pharmacist needs clarification on dosing.   Directions state that patient should injection 2 mg weekly, however, prescription is for 1 mg pen.   If patient is supposed to be on 2 mg weekly, please send new pen for 8 mg/3 mL.   If she is to continue on 1 mg dosing, new prescription will need to be sent.   Talbot Grumbling, RN

## 2022-09-12 NOTE — Therapy (Signed)
OUTPATIENT PHYSICAL THERAPY TREATMENT NOTE   Patient Name: RENATTA SHRIEVES MRN: [id number] DOB:[birth year], 57 y.o., female Today's Date: 09/12/2022  END OF SESSION:   PT End of Session - 09/12/22 1301     Visit Number 2    Number of Visits 16    PT Start Time 1300    PT Stop Time 4098    PT Time Calculation (min) 43 min    Activity Tolerance Patient tolerated treatment well;No increased pain;Patient limited by fatigue    Behavior During Therapy [facility] for tasks assessed/performed             Past Medical History:  Diagnosis Date   Bronchitis    Chronic back pain    Depression    Diabetes mellitus without complication ([place])    Osteoarthritis    Past Surgical History:  Procedure Laterality Date   CESAREAN SECTION     CHOLECYSTECTOMY     TUBAL LIGATION     Patient Active Problem List   Diagnosis Date Noted   Acute right ankle pain 08/14/2022   Right wrist pain 07/24/2022   Pain of toe of right foot 04/27/2022   Type 2 diabetes mellitus with neurological complications ([place]) 11/91/4782   Bipolar I disorder ([place]) 03/30/2020   Recurrent major depressive disorder, in partial remission ([place]) 03/30/2020   Generalized anxiety disorder 03/30/2020   Obstructive sleep apnea 11/21/2018   Osteoarthritis of left knee 05/15/2018   Gastro-esophageal reflux 05/15/2018   Hyperlipidemia 12/10/2017   Type 2 diabetes mellitus with hyperglycemia ([place]) 11/18/2017   Depression 02/08/2017   Chronic low back pain 02/08/2017   Asthma 02/08/2017   Psoriasis 02/08/2017   Healthcare maintenance 02/08/2017     THERAPY DIAG:  Difficulty in walking, not elsewhere classified  Muscle weakness (generalized)  Stiffness of right ankle, not elsewhere classified  Pain in right ankle and joints of right foot    Chronic low back pain 02/08/2017   Asthma 02/08/2017   Psoriasis 02/08/2017   Healthcare maintenance 02/08/2017      PCP: Erskine Emery, MD   REFERRING PROVIDER: Gregor Hams,  MD   REFERRING DIAG:  Diagnosis  M25.571,G89.29 (ICD-10-CM) - Chronic pain of right ankle      THERAPY DIAG:  Difficulty in walking, not elsewhere classified - Plan: PT plan of care cert/re-cert   Muscle weakness (generalized) - Plan: PT plan of care cert/re-cert   Stiffness of right ankle, not elsewhere classified - Plan: PT plan of care cert/re-cert   Pain in right ankle and joints of right foot - Plan: PT plan of care cert/re-cert   Rationale for Evaluation and Treatment: Rehabilitation   ONSET DATE: Chronic   SUBJECTIVE:    SUBJECTIVE STATEMENT: Assunta walked into her appointment this morning using a cane in her right hand.  Her hand specialist had recommended that she avoid using the cane in the right hand to help rest her overuse and tendonitis.  She reports early compliance with her 1 starting home exercise.   Jamison notes chronic right ankle pain along with right hand, low back and left knee pain.  She has been advised not to use the cane in her right hand any longer to help calm down multiple tendonitis problems in the right hand.  She would like to be able to stand and walk with better endurance and less pain to complete normal activities around her house.  Prolonged standing bothers Lt knee and Rt ankle.  Uses a "buggy" at [employer] or the  grocery store to avoid standing for too long.   PERTINENT HISTORY: Bronchitis, chronic back pain, depression, diabetes, osteoarthritis PAIN:  Are you having pain? Yes: NPRS scale: 4-9/10 Pain location: Right ankle, deep in the joint and lateral ankle Pain description: Ache, sore and can be sharp Aggravating factors: Prolonged standing and walking Relieving factors: Rest   PRECAUTIONS: None   WEIGHT BEARING RESTRICTIONS: No   FALLS:  Has patient fallen in last 6 months? No   LIVING ENVIRONMENT: Lives with: lives with their family Lives in: House/apartment Stairs:  Avoids stairs Has following equipment at home: Single  point cane   OCCUPATION: Disabled, tries to take care of the house, has trouble with certain chores due to wrist   PLOF: Independent with household mobility with device   PATIENT GOALS: Be able to be more independent with housework and have better standing and walking endurance with less right ankle pain   NEXT MD VISIT:    OBJECTIVE:    DIAGNOSTIC FINDINGS: IMPRESSION: 1. Mild tibiotalar osteoarthritis. 2. Normal variant os trigonum with associated osteoarthritis at the synchondrosis. 3. Large plantar calcaneal heel spur.   PATIENT SURVEYS:  FOTO Next visit (late start at evaluation)   COGNITION: Overall cognitive status: Within functional limits for tasks assessed                         SENSATION: Tomeika did not note any peripheral tingling or paresthesias     LOWER EXTREMITY ROM:   Active ROM Right eval Left eval  Hip flexion      Hip extension      Hip abduction      Hip adduction      Hip internal rotation      Hip external rotation      Knee flexion      Knee extension      Ankle dorsiflexion -8 -8  Ankle plantarflexion      Ankle inversion      Ankle eversion       (Blank rows = not tested)   LOWER EXTREMITY MMT:   MMT Left/Right in pounds 08/31/2022    Hip flexion      Hip extension      Hip abduction      Hip adduction      Hip internal rotation      Hip external rotation      Knee flexion      Knee extension      Ankle dorsiflexion      Ankle plantarflexion      Ankle inversion 10.4/7.9     Ankle eversion 15.1/13.5     (Blank rows = not tested)   GAIT: Distance walked: Within the clinic Assistive device utilized: Single point cane Level of assistance: Complete Independence Comments: [name] was advised to use the cane in her left hand to help calm down right hand tendinitis and to more adequately unweight her right ankle     TODAY'S TREATMENT:                                                                       DATE:    09/12/2021 Quadriceps sets 2 sets of 10 for 5 seconds with  Bil ankle dorsiflexion Bridging (limited-range) 10X 3 seconds Heel to toe raises seated 15X 3 seconds  Neuromuscular re-education: Wide balance 2 sets of 3X 20 seconds  Functional Activities:  Sit to stand 2 sets of 5X slow eccentrics   08/31/2022 Quadriceps sets with pillow under both knees, encouraged bilateral ankle dorsiflexion 2 sets of 10 for 5 seconds (50+ per day recommended)     PATIENT EDUCATION:  Education details: We discussed keeping the cane in the left hand to unweight her right ankle and also help reduce overuse of the right hand Person educated: Patient Education method: Explanation, Demonstration, Tactile cues, Verbal cues, and Handouts Education comprehension: verbalized understanding, returned demonstration, verbal cues required, tactile cues required, and needs further education   HOME EXERCISE PROGRAM: Access Code: [email or web address] URL: [email or web address] Date: 09/12/2022 Prepared by: Vista Mink  Exercises - Supine Quadricep Sets  - 3-5 x daily - 7 x weekly - 1-2 sets - 10 reps - 5 second hold - Bridge  - 2 x daily - 7 x weekly - 1 sets - 10 reps - 3 seconds hold - Tandem Stance  - 2 x daily - 7 x weekly - 1 sets - 3 reps - 20 second hold - Seated Heel Toe Raises  - 3-5 x daily - 7 x weekly - 1 sets - 10 reps - Sit to Stand with Armchair  - 2 x daily - 7 x weekly - 1 sets - 5 reps   ASSESSMENT:   CLINICAL IMPRESSION: Ladajah reports partial compliance with her early home exercises.  We had to pace things out and give her adequate rest breaks today due to deconditioning.  She has a great starter home exercise program and realizes she will need to be consistent with these in order to meet her long-term goals.   OBJECTIVE IMPAIRMENTS: Abnormal gait, cardiopulmonary status limiting activity, decreased activity tolerance, decreased coordination, decreased endurance, decreased knowledge  of condition, decreased mobility, difficulty walking, decreased ROM, decreased strength, decreased safety awareness, increased edema, impaired perceived functional ability, impaired flexibility, obesity, and pain.   ACTIVITY LIMITATIONS: carrying, lifting, bending, standing, squatting, stairs, bed mobility, and locomotion level   PARTICIPATION LIMITATIONS: meal prep, cleaning, laundry, shopping, and community activity   PERSONAL FACTORS: Bronchitis, chronic back pain, depression, diabetes, osteoarthritis are also affecting patient's functional outcome.    REHAB POTENTIAL: Good   CLINICAL DECISION MAKING: Stable/uncomplicated   EVALUATION COMPLEXITY: Low     GOALS: Goals reviewed with patient? Yes   SHORT TERM GOALS: Target date: 09/28/2022 Improve bilateral heel cords flexibility to 0 degrees Baseline: 8 degrees of plantarflexion bilaterally Goal status: INITIAL   2.  Improve bilateral ankle strength to at least 20 pounds inversion and eversion Baseline: 7 to 15 pounds Goal status: INITIAL   3.  Nyeemah will be comfortable with using her cane in her left hand to unweight her right ankle and allow her right hand to break to help calm tendonitis Baseline: Using cane in the right hand Goal status: On Going 09/12/2022     LONG TERM GOALS: Target date: 11/24/2022   Improve FOTO to long-term goal Baseline: To be established next visit as she started late today after her OT appointment Goal status: INITIAL   2.  Improve right ankle pain to consistently less than 4 out of 10 on the visual analog scale Baseline: Can be 9 out of 10 Goal status: On Going 09/12/2022   3.  Improve bilateral ankle dorsiflexion active range of motion to 10  degrees Baseline: -8 degrees Goal status: INITIAL   4.  Improve bilateral ankle strength for inversion and eversion to at least 40 pounds Baseline: 15 pounds or less Goal status: INITIAL   5.  Ekam will report improved standing and walking  endurance Baseline: Poor standing and walking endurance at evaluation Goal status: On Going 09/12/2022   6.  Naomi will be independent with a long-term home exercise program at discharge Baseline: Started 08/31/2022 Goal status: On Going 09/12/2022     PLAN:   PT FREQUENCY: 1x/week   PT DURATION: 12 weeks   PLANNED INTERVENTIONS: Therapeutic exercises, Therapeutic activity, Neuromuscular re-education, Balance training, Gait training, Patient/Family education, Self Care, Stair training, and Cryotherapy   PLAN FOR NEXT SESSION: Review HEP, consider progressions to balance training for ankle, knee and hip strengthening, consider general lower extremity strengthening progressions with emphasis on the ankle and quadriceps.      Cherlyn Cushing, PT, MPT 09/12/2022, 5:16 PM

## 2022-09-12 NOTE — Telephone Encounter (Signed)
This is still showing as pending on my end. Can you please resend?

## 2022-09-14 ENCOUNTER — Other Ambulatory Visit: Payer: Self-pay | Admitting: Student

## 2022-09-14 DIAGNOSIS — E119 Type 2 diabetes mellitus without complications: Secondary | ICD-10-CM

## 2022-09-14 DIAGNOSIS — E785 Hyperlipidemia, unspecified: Secondary | ICD-10-CM

## 2022-09-17 NOTE — Therapy (Incomplete)
OUTPATIENT OCCUPATIONAL THERAPY TREATMENT NOTE  Patient Name: Amanda Larson MRN: [id number] DOB:[birth year], 57 y.o., female Today's Date: 09/17/2022  PCP: Alfredo Martinez, MD  REFERRING PROVIDER:  Rodolph Bong, MD    END OF SESSION:     Past Medical History:  Diagnosis Date   Bronchitis    Chronic back pain    Depression    Diabetes mellitus without complication [facility])    Osteoarthritis    Past Surgical History:  Procedure Laterality Date   CESAREAN SECTION     CHOLECYSTECTOMY     TUBAL LIGATION     Patient Active Problem List   Diagnosis Date Noted   Acute right ankle pain 08/14/2022   Right wrist pain 07/24/2022   Pain of toe of right foot 04/27/2022   Type 2 diabetes mellitus with neurological complications (HCC) 01/25/2022   Bipolar I disorder (HCC) 03/30/2020   Recurrent major depressive disorder, in partial remission (HCC) 03/30/2020   Generalized anxiety disorder 03/30/2020   Obstructive sleep apnea 11/21/2018   Osteoarthritis of left knee 05/15/2018   Gastro-esophageal reflux 05/15/2018   Hyperlipidemia 12/10/2017   Type 2 diabetes mellitus with hyperglycemia (HCC) 11/18/2017   Depression 02/08/2017   Chronic low back pain 02/08/2017   Asthma 02/08/2017   Psoriasis 02/08/2017   Healthcare maintenance 02/08/2017    ONSET DATE: ~6 months (June 2023)   REFERRING DIAG: M25.531 (ICD-10-CM) - Right wrist pain   THERAPY DIAG:  No diagnosis found.  Rationale for Evaluation and Treatment: Rehabilitation  PERTINENT HISTORY: Per MD Note: "Pt reports R wrist pain x 6 months. Pt is R-hand dominate. Pt locates pain to the radial aspect of the wrist, into 1st MC, into thumb, and along the 5th [facility]." She is also seeking PT tx for Rt ankle pain for 2-3 months.  Per OT eval: "states having a wrist support that hurts her because it feels to tight, so she is not wearing it and hasn't been wearing it. She states ulnar nerve numbness in Rt hand at night at times, pain  from upper/back neck to down shoulder into forearm and hand at times.  Thumb basal joint and wrist crease also painful and tender, but the worst pain in on the ulnar side of her wrist and hand which is also significantly swollen. "   PRECAUTIONS: Fall;  WEIGHT BEARING RESTRICTIONS: No   SUBJECTIVE:   SUBJECTIVE STATEMENT: She states ***  her hand" draws up" in the morning, very painful but once she gets it going, it moves better.  She is using cane in Lt hand now, well.    PAIN:  Are you having pain? *** Yes in Rt wrist and foot Rating: 7/10 at rest now, up to 10/10 at worst in past week. She is more stiff and painful in mornings.    FALLS: Has patient fallen in last 6 months? Yes. Number of falls fell once in July at the pool onto L shoulder  PATIENT GOALS:  decrease pain in rt dominant hand/arm   OBJECTIVE: (All objective assessments below are from initial evaluation on: 08/31/22 unless otherwise specified.)   HAND DOMINANCE: Right   ADLs: Overall ADLs: States decreased ability to grab, hold household objects, pain and inability to open containers, perform FMS tasks (manipulate fasteners on clothing), mild to moderate bathing problems as well.    FUNCTIONAL OUTCOME MEASURES: Eval: Patient Rated Wrist Evaluation (PRWE): Pain: 46/50; Function: 42.5/50; Total Score: 88.5/100 (Higher Score  =  More Pain and/or Debility)  UPPER EXTREMITY ROM    At eval she she showed poor rounded shoulder postures, noticeably limited shoulder range of motion as well as wrist and hand range of motion on the right side.  Not all details could be measured today due to severity and number of complaints.  Details to be determined next session.  Shoulder to Wrist AROM Right 09/11/22  Shoulder flexion 118  Shoulder abduction 91  Shoulder extension 36  Shoulder internal rotation Pants line  Shoulder external rotation 70  Elbow flexion 146  Elbow extension 0  Forearm supination 38  Forearm  pronation  85  Wrist flexion 35  Wrist extension 30  Wrist ulnar deviation 15  Wrist radial deviation 21  (Blank rows = not tested)   Hand AROM Right eval  Full Fist Ability (or Gap to Distal Palmar Crease) No- ~1cm gap from digits 2-4, and 2cm gap digit 5  Thumb Opposition  (Kapandji Scale)  5  Thumb MCP (0-60) 0-45  Thumb IP (0-80) 0-37  Thumb Palmar Abduction Span 5.5 cm span  Little MCP (0-90) 0 -  54  Little PIP (0-100) (-37) -  57  Little DIP (0-70)  (-6) - 22  (Blank rows = not tested)   UPPER EXTREMITY MMT:    This was not tested at evaluation due to acute and chronic pains and limited time available.  Details to be determined, but at the wrist and hand she was grossly 3-5 MMT due to apparent limited motion  MMT Right TBD  Shoulder flexion   Shoulder abduction   Shoulder adduction   Shoulder extension   Shoulder internal rotation   Shoulder external rotation   Middle trapezius   Lower trapezius   Elbow flexion   Elbow extension   Forearm supination   Forearm pronation   Wrist flexion   Wrist extension   Wrist ulnar deviation   Wrist radial deviation   (Blank rows = not tested)  HAND FUNCTION: Eval: Observed weakness in affected Rt hand.  Grip strength Right: TBD lbs, Left: TBD lbs   COORDINATION: 09/11/22: Box and Blocks Test: RIGHT 42 Blocks today (56 is WFL)   SENSATION: Eval:  Light touch intact today, though median nerve had 34mm static 2-pt discrimination and ulnar tested 70mm.   She was positive Tinel test at elbow (for ulnar) and guyon's canal.   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  The fact that she holds her cane in her right hand, applying pressure and leaning on the side is obviously exacerbating all of these issues.   TODAY'S TREATMENT:  09/20/22: ***Check new orthotic as needed, review exercise program as taught so far, educated on the remaining portions of the home exercise program as tolerated for the forearm and wrist.  Exercises - Seated Scapular Retraction  - 4 x daily - 5-10 reps - Seated Shoulder Flexion Towel Slide at Table Top  - 4 x daily - 3-5 reps - 15 hold - Seated Shoulder External Rotation PROM on Table  - 3-4 x daily - 3-5 reps - 15 sec hold - Standing Shoulder Internal Rotation AAROM with Dowel  -  4-6 x daily - 1 sets - 10-15 reps - Standing Shoulder Extension ROM with Dowel  - 4 x daily - 10-15 reps - Forearm Supination Stretch  - 3-4 x daily - 3-5 reps - 15 sec hold - Bend and Pull Back Wrist SLOWLY  - 4 x daily - 10-15 reps - Wrist Flexion Stretch  - 4 x daily - 3-5 reps - 15 sec hold - Wrist Prayer Stretch  - 4 x daily - 3-5 reps - 15 sec hold - Seated Wrist Ulnar Deviation Stretch  - 3-5 x daily - 3-5 reps - 15 sec hold - BACK KNUCKLE STRETCHES   - 4 x daily - 3-5 reps - 15 sec hold - Thumb stretch  - 4 x daily - 3-5 reps - 15-20 sec hold - Ulnar Nerve Flossing  - 3-4 x daily - 1-2 sets - 5-10 reps   09/11/22: Custom orthotic fabrication was indicated due to pt's painful Rt arm tendonitis, arthritis and other issues and need for safe, functional positioning. OT fabricated custom forearm based thumb spica orthotic for pt today to rest and immobilize Rt wrist, thumb. It fit well with no areas of pressure, pt states a comfortable fit. Pt was educated on the wearing schedule, to call or come in ASAP if it is causing any irritation or is not achieving desired function. It will be checked/adjusted in upcoming sessions, as needed. Pt states understanding.   She performs active range of motion for full measurements from shoulder  to hand on the right side.  Indeed she does show deficits weakness and pain.  OT also reviews initial recommendations and HEP- providing a comprehensive print-out of all exercises now (as below). Due to limitations in time, only the bolded could be educated on/performed with her today. She also performs box and blocks testing showing impairment in functional ability with the right arm, having soreness and pain in her shoulder from moving very light objects for only 1 minute (shows decreased function, decreased range of motion, decreased functional endurance).  Exercises - Seated Scapular Retraction  - 4 x daily - 5-10 reps - Seated Shoulder Flexion Towel Slide at Table Top  - 4 x daily - 3-5 reps - 15 hold - Seated Shoulder External Rotation PROM on Table  - 3-4 x daily - 3-5 reps - 15 sec hold - Standing Shoulder Internal Rotation AAROM with Dowel  - 4-6 x daily - 1 sets - 10-15 reps - Standing Shoulder Extension ROM with Dowel  - 4 x daily - 10-15 reps - Forearm Supination Stretch  - 3-4 x daily - 3-5 reps - 15 sec hold - Bend and Pull Back Wrist SLOWLY  - 4 x daily - 10-15 reps - Wrist Flexion Stretch  - 4 x daily - 3-5 reps - 15 sec hold - Wrist Prayer Stretch  - 4 x daily - 3-5 reps - 15 sec hold - Seated Wrist Ulnar Deviation Stretch  - 3-5 x daily - 3-5 reps - 15 sec hold - BACK KNUCKLE STRETCHES   - 4 x daily - 3-5 reps - 15 sec hold - Thumb stretch  - 4 x daily - 3-5 reps - 15-20 sec hold - Ulnar Nerve Flossing  - 3-4 x daily - 1-2 sets - 5-10 reps  PATIENT EDUCATION: Education details: See tx section above for details  Person educated: Patient Education method: Verbal Instruction, Teach back, Handouts  Education comprehension: States and demonstrates understanding, Additional Education required  HOME EXERCISE PROGRAM: Access Code: [email or web address] URL: [email or web address] Date: 09/11/2022 Prepared by: Fannie Knee   GOALS: Goals reviewed with patient?  Yes   SHORT TERM GOALS: (STG required if POC>30 days) Target Date: 09/14/22  Pt will obtain protective, custom orthotic. Goal status: 09/11/22 MET 2.  Pt will demo/state understanding of initial HEP to improve pain levels and prerequisite motion. Goal status: Progressing   LONG TERM GOALS: Target Date: 11/09/22  Pt will improve functional ability by decreased impairment per PRWE assessment from 88.5/100 to 35/100 or better, for better quality of life. Goal status: INITIAL  2.  Pt will improve grip strength in Rt hand to at least non-painful 30lbs for functional use at home and in IADLs. Goal status: INITIAL  3.  Pt will improve A/ROM in Rt SF TAM from 90* to at least 180*, to improve functional motion for tasks like reach and grasp.  Goal status: INITIAL  4.  Pt will improve strength in Rt wrist extension from painful 3-/5 MMT to at least 4/5 MMT to have increased functional ability to carry out selfcare and higher-level homecare tasks with no difficulty. Goal status: INITIAL  5.  Pt will improve coordination skills in Rt arm, as seen by [facility] score on Box and Blocks testing to have increased functional ability to carry out fine motor tasks (fasteners, etc.) and more complex, coordinated IADLs (meal prep, sports, etc.).  Goal status: INITIAL  6.  Pt will decrease pain at rest from 8/10 to 3/10 or better to have better sleep and occupational participation in daily roles. Goal status: INITIAL   ASSESSMENT:  CLINICAL IMPRESSION: 09/20/22: ***  09/11/22: We will continue plan of care to see if orthotic helps heal painful right wrist and arm, review exercises to help with the shoulder and posture issues, finished educating on wrist and hand stretches as needed.  Eventually forearm orthotic could be transition to hand-based thumb brace as needed for longer-term management of thumb arthritis.   PLAN:  OT FREQUENCY: 2x/week  OT DURATION: 10 weeks (through 11/09/22 as needed)   PLANNED  INTERVENTIONS: self care/ADL training, therapeutic exercise, therapeutic activity, neuromuscular re-education, manual therapy, passive range of motion, splinting, electrical stimulation, ultrasound, fluidotherapy, compression bandaging, moist heat, cryotherapy, contrast bath, patient/family education, and coping strategies training  RECOMMENDED OTHER SERVICES: She is also seeing PT for LE pain  CONSULTED AND AGREED WITH PLAN OF CARE: Patient  PLAN FOR NEXT SESSION:  ***  Fannie Knee, OTR/L, CHT 09/17/2022, 4:42 PM

## 2022-09-20 ENCOUNTER — Encounter: Payer: Medicare HMO | Admitting: Rehabilitative and Restorative Service Providers"

## 2022-09-26 NOTE — Therapy (Addendum)
OUTPATIENT OCCUPATIONAL THERAPY TREATMENT & DISCHARGE NOTE  Patient Name: Amanda Larson MRN: [id number] DOB:08-Mar-1966, 57 y.o., female Today's Date: 09/27/2022  PCP: Alfredo Martinez, MD  REFERRING PROVIDER:  Rodolph Bong, MD        OCCUPATIONAL THERAPY DISCHARGE SUMMARY  Visits from Start of Care: 3  Pt stopped showing up to appointments, did not call/contact back, is discharged from therapy now   Fannie Knee, OTR/L, CHT 02/14/23      END OF SESSION:  OT End of Session - 09/27/22 1301     Visit Number 3    Number of Visits 16    Date for OT Re-Evaluation 11/09/22    Authorization Type [employer]    Progress Note Due on Visit 10    OT Start Time 1302    OT Stop Time 1345    OT Time Calculation (min) 43 min    Equipment Utilized During Treatment --    Activity Tolerance Patient limited by pain;Patient limited by fatigue;Patient tolerated treatment well;No increased pain    Behavior During Therapy WFL for tasks assessed/performed             Past Medical History:  Diagnosis Date   Bronchitis    Chronic back pain    Depression    Diabetes mellitus without complication (HCC)    Osteoarthritis    Past Surgical History:  Procedure Laterality Date   CESAREAN SECTION     CHOLECYSTECTOMY     TUBAL LIGATION     Patient Active Problem List   Diagnosis Date Noted   Acute right ankle pain 08/14/2022   Right wrist pain 07/24/2022   Pain of toe of right foot 04/27/2022   Type 2 diabetes mellitus with neurological complications (HCC) 01/25/2022   Bipolar I disorder (HCC) 03/30/2020   Recurrent major depressive disorder, in partial remission (HCC) 03/30/2020   Generalized anxiety disorder 03/30/2020   Obstructive sleep apnea 11/21/2018   Osteoarthritis of left knee 05/15/2018   Gastro-esophageal reflux 05/15/2018   Hyperlipidemia 12/10/2017   Type 2 diabetes mellitus with hyperglycemia (HCC) 11/18/2017   Depression 02/08/2017   Chronic low back  pain 02/08/2017   Asthma 02/08/2017   Psoriasis 02/08/2017   Healthcare maintenance 02/08/2017    ONSET DATE: ~6 months (June 2023)   REFERRING DIAG: M25.531 (ICD-10-CM) - Right wrist pain   THERAPY DIAG:  Pain in right hand  Pain in right wrist  Localized edema  Other lack of coordination  Stiffness of right wrist, not elsewhere classified  Stiffness of right hand, not elsewhere classified  Rationale for Evaluation and Treatment: Rehabilitation  PERTINENT HISTORY: Per MD Note: "Pt reports R wrist pain x 6 months. Pt is R-hand dominate. Pt locates pain to the radial aspect of the wrist, into 1st MC, into thumb, and along the 5th [facility]." She is also seeking PT tx for Rt ankle pain for 2-3 months.  Per OT eval: "states having a wrist support that hurts her because it feels to tight, so she is not wearing it and hasn't been wearing it. She states ulnar nerve numbness in Rt hand at night at times, pain from upper/back neck to down shoulder into forearm and hand at times.  Thumb basal joint and wrist crease also painful and tender, but the worst pain in on the ulnar side of her wrist and hand which is also significantly swollen. "   PRECAUTIONS: Fall;  WEIGHT BEARING RESTRICTIONS: No   SUBJECTIVE:   SUBJECTIVE STATEMENT: She states  feeling better, less pain now and can touch Rt thumb to small finger tip!    PAIN:  Are you having pain?  Yes in Rt wrist Rating: 4/10 at rest now, up to 9/10 at worst in past week in mornings     FALLS: Has patient fallen in last 6 months? Yes. Number of falls fell once in July at the pool onto L shoulder  PATIENT GOALS:  decrease pain in rt dominant hand/arm   OBJECTIVE: (All objective assessments below are from initial evaluation on: 08/31/22 unless otherwise specified.)   HAND DOMINANCE: Right   ADLs: Overall ADLs: States decreased ability to grab, hold household objects, pain and inability to open containers, perform FMS tasks (manipulate  fasteners on clothing), mild to moderate bathing problems as well.    FUNCTIONAL OUTCOME MEASURES: Eval: Patient Rated Wrist Evaluation (PRWE): Pain: 46/50; Function: 42.5/50; Total Score: 88.5/100 (Higher Score  =  More Pain and/or Debility)    UPPER EXTREMITY ROM    At eval she she showed poor rounded shoulder postures, noticeably limited shoulder range of motion as well as wrist and hand range of motion on the right side.  Not all details could be measured today due to severity and number of complaints.  Details to be determined next session.  Shoulder to Wrist AROM Right 09/11/22 Rt TBD  Shoulder flexion 118   Shoulder abduction 91   Shoulder extension 36   Shoulder internal rotation Pants line   Shoulder external rotation 70   Elbow flexion 146   Elbow extension 0   Forearm supination 38   Forearm pronation  85   Wrist flexion 35   Wrist extension 30   Wrist ulnar deviation 15   Wrist radial deviation 21   (Blank rows = not tested)   Hand AROM Right eval  Full Fist Ability (or Gap to Distal Palmar Crease) No- ~1cm gap from digits 2-4, and 2cm gap digit 5  Thumb Opposition  (Kapandji Scale)  5  Thumb MCP (0-60) 0-45  Thumb IP (0-80) 0-37  Thumb Palmar Abduction Span 5.5 cm span  Little MCP (0-90) 0 -  54  Little PIP (0-100) (-37) -  57  Little DIP (0-70)  (-6) - 22  (Blank rows = not tested)   UPPER EXTREMITY MMT:    This was not tested at evaluation due to acute and chronic pains and limited time available.  Details to be determined, but at the wrist and hand she was grossly 3-5 MMT due to apparent limited motion  MMT Right TBD  Shoulder flexion   Shoulder abduction   Shoulder adduction   Shoulder extension   Shoulder internal rotation   Shoulder external rotation   Middle trapezius   Lower trapezius   Elbow flexion   Elbow extension   Forearm supination   Forearm pronation   Wrist flexion   Wrist extension   Wrist ulnar deviation   Wrist radial  deviation   (Blank rows = not tested)  HAND FUNCTION: Eval: Observed weakness in affected Rt hand.  Grip strength Right: TBD lbs, Left: TBD lbs   COORDINATION: 09/11/22: Box and Blocks Test: RIGHT 42 Blocks today (56 is WFL)   SENSATION: Eval:  Light touch intact today, though median nerve had 6mm static 2-pt discrimination and ulnar tested 4mm.   She was positive Tinel test at elbow (for ulnar) and guyon's canal.   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  The fact that she holds her cane in her right hand, applying pressure and leaning on the side is obviously exacerbating all of these issues.   TODAY'S TREATMENT:  09/27/22: She did not bring orthotic to be checked, but states that it fits perfectly and does not need adjusted.  She was still told to wear this to help her arm rest from her tendinitis as needed for any heavy tasks.  Additionally OT reviews her initial shoulder stretches with her, then educates thoroughly on the remaining 8 exercises in her home exercise program-including ulnar nerve glides to help reeducate likely compressed ulnar nerve (as bolded below).  She performs each of these back to OT in detail carefully as to not add additional pain.  She tolerates these well and is encouraged to do them at least 2 or 3 times a day as able nonpainfully.  Again she was reminded to use heat or ice modalities as needed as well.    Exercises - Seated Scapular Retraction  - 4 x daily - 5-10 reps - Seated Shoulder Flexion Towel Slide at Table Top  - 4 x daily - 3-5 reps - 15 hold - Seated Shoulder External  Rotation PROM on Table  - 3-4 x daily - 3-5 reps - 15 sec hold - Standing Shoulder Internal Rotation AAROM with Dowel  - 4-6 x daily - 1 sets - 10-15 reps - Standing Shoulder Extension ROM with Dowel  - 4 x daily - 10-15 reps - Forearm Supination Stretch  - 3-4 x daily - 3-5 reps - 15 sec hold - Bend and Pull Back Wrist SLOWLY  - 4 x daily - 10-15 reps - Wrist Flexion Stretch  - 4 x daily - 3-5 reps - 15 sec hold - Wrist Prayer Stretch  - 4 x daily - 3-5 reps - 15 sec hold - Seated Wrist Ulnar Deviation Stretch  - 3-5 x daily - 3-5 reps - 15 sec hold - BACK KNUCKLE STRETCHES   - 4 x daily - 3-5 reps - 15 sec hold - Thumb stretch  - 4 x daily - 3-5 reps - 15-20 sec hold - Ulnar Nerve Flossing  - 3-4 x daily - 1-2 sets - 5-10 reps  PATIENT EDUCATION: Education details: See tx section above for details  Person educated: Patient Education method: Verbal Instruction, Teach back, Handouts  Education comprehension: States and demonstrates understanding, Additional Education required    HOME EXERCISE PROGRAM: Access Code: [email or web address] URL: [email or web address] Date: 09/11/2022 Prepared by: Fannie Knee   GOALS: Goals reviewed with patient? Yes   SHORT TERM GOALS: (STG required if POC>30 days) Target Date: 09/14/22  Pt will obtain protective, custom orthotic. Goal status: 09/11/22 MET 2.  Pt will demo/state understanding of initial HEP to improve pain levels and prerequisite motion. Goal status: Progressing   LONG TERM GOALS: Target Date: 11/09/22  Pt will improve functional ability by decreased impairment per PRWE assessment from 88.5/100 to 35/100 or better, for better quality of life. Goal status: INITIAL  2.  Pt will improve grip strength in Rt hand to at least non-painful 30lbs for functional use at home and in IADLs. Goal status: INITIAL  3.  Pt will improve A/ROM in Rt SF TAM from 90* to at least 180*, to improve functional motion for tasks like reach and grasp.   Goal status: INITIAL  4.  Pt will improve strength in Rt wrist extension from painful 3-/5 MMT to at least 4/5 MMT to have increased functional ability to carry out selfcare and higher-level homecare tasks with no difficulty. Goal status: INITIAL  5.  Pt will improve coordination skills in Rt arm, as seen by [facility] score on Box and Blocks testing to have increased functional ability to carry out fine motor tasks (fasteners, etc.) and more complex, coordinated IADLs (meal prep, sports, etc.).  Goal status: INITIAL  6.  Pt will decrease pain at rest from 8/10 to 3/10 or better to have better sleep and occupational participation in daily roles. Goal status: INITIAL   ASSESSMENT:  CLINICAL IMPRESSION: 09/27/22: She now has a full home exercise program from shoulder to hand, a good fitting orthotic to help her rest, and the education to prevent pain and hopefully decrease symptoms and increase functional ability.  Eventually we will need to upgrade to strengthening portions when her pain is consistently lower and her mobility is better.    PLAN:  OT FREQUENCY: 2x/week  OT DURATION: 10 weeks (through 11/09/22 as needed)   PLANNED INTERVENTIONS: self care/ADL training, therapeutic exercise, therapeutic activity, neuromuscular re-education, manual therapy, passive range of motion, splinting, electrical stimulation, ultrasound, fluidotherapy, compression bandaging, moist heat, cryotherapy, contrast bath, patient/family education, and coping strategies training  RECOMMENDED OTHER SERVICES: She is also seeing PT for LE pain  CONSULTED AND AGREED WITH PLAN OF CARE: Patient  PLAN FOR NEXT SESSION:  Check orthotic as needed, review full home exercise program from shoulder to hand, take grip strength and other new range of motion measures to check status.  Fannie Knee, OTR/L, CHT 09/27/2022, 5:59 PM

## 2022-09-27 ENCOUNTER — Ambulatory Visit (INDEPENDENT_AMBULATORY_CARE_PROVIDER_SITE_OTHER): Payer: Medicare HMO | Admitting: Rehabilitative and Restorative Service Providers"

## 2022-09-27 ENCOUNTER — Encounter: Payer: Self-pay | Admitting: Rehabilitative and Restorative Service Providers"

## 2022-09-27 DIAGNOSIS — M25671 Stiffness of right ankle, not elsewhere classified: Secondary | ICD-10-CM

## 2022-09-27 DIAGNOSIS — M25641 Stiffness of right hand, not elsewhere classified: Secondary | ICD-10-CM

## 2022-09-27 DIAGNOSIS — M6281 Muscle weakness (generalized): Secondary | ICD-10-CM

## 2022-09-27 DIAGNOSIS — R278 Other lack of coordination: Secondary | ICD-10-CM

## 2022-09-27 DIAGNOSIS — M25631 Stiffness of right wrist, not elsewhere classified: Secondary | ICD-10-CM | POA: Diagnosis not present

## 2022-09-27 DIAGNOSIS — M25571 Pain in right ankle and joints of right foot: Secondary | ICD-10-CM

## 2022-09-27 DIAGNOSIS — M25531 Pain in right wrist: Secondary | ICD-10-CM | POA: Diagnosis not present

## 2022-09-27 DIAGNOSIS — R262 Difficulty in walking, not elsewhere classified: Secondary | ICD-10-CM

## 2022-09-27 DIAGNOSIS — R600 Localized edema: Secondary | ICD-10-CM

## 2022-09-27 DIAGNOSIS — M79641 Pain in right hand: Secondary | ICD-10-CM | POA: Diagnosis not present

## 2022-09-27 NOTE — Therapy (Addendum)
OUTPATIENT PHYSICAL THERAPY TREATMENT NOTE  /DISCHARGE   Patient Name: Amanda Larson MRN: [id number] DOB:[birth year], 57 y.o., female Today's Date: 09/27/2022  END OF SESSION:   PT End of Session - 09/27/22 1348     Visit Number 3    Number of Visits 16    PT Start Time 1346    PT Stop Time 1432    PT Time Calculation (min) 46 min    Activity Tolerance Patient tolerated treatment well;No increased pain;Patient limited by fatigue    Behavior During Therapy [facility] for tasks assessed/performed             Past Medical History:  Diagnosis Date   Bronchitis    Chronic back pain    Depression    Diabetes mellitus without complication (HCC)    Osteoarthritis    Past Surgical History:  Procedure Laterality Date   CESAREAN SECTION     CHOLECYSTECTOMY     TUBAL LIGATION     Patient Active Problem List   Diagnosis Date Noted   Acute right ankle pain 08/14/2022   Right wrist pain 07/24/2022   Pain of toe of right foot 04/27/2022   Type 2 diabetes mellitus with neurological complications (HCC) 01/25/2022   Bipolar I disorder (HCC) 03/30/2020   Recurrent major depressive disorder, in partial remission (HCC) 03/30/2020   Generalized anxiety disorder 03/30/2020   Obstructive sleep apnea 11/21/2018   Osteoarthritis of left knee 05/15/2018   Gastro-esophageal reflux 05/15/2018   Hyperlipidemia 12/10/2017   Type 2 diabetes mellitus with hyperglycemia (HCC) 11/18/2017   Depression 02/08/2017   Chronic low back pain 02/08/2017   Asthma 02/08/2017   Psoriasis 02/08/2017   Healthcare maintenance 02/08/2017     THERAPY DIAG:  Difficulty in walking, not elsewhere classified  Muscle weakness (generalized)  Stiffness of right ankle, not elsewhere classified  Pain in right ankle and joints of right foot    Chronic low back pain 02/08/2017   Asthma 02/08/2017   Psoriasis 02/08/2017   Healthcare maintenance 02/08/2017      PCP: Alfredo Martinez, MD   REFERRING PROVIDER:  Rodolph Bong, MD   REFERRING DIAG:  Diagnosis  M25.571,G89.29 (ICD-10-CM) - Chronic pain of right ankle      THERAPY DIAG:  Difficulty in walking, not elsewhere classified - Plan: PT plan of care cert/re-cert   Muscle weakness (generalized) - Plan: PT plan of care cert/re-cert   Stiffness of right ankle, not elsewhere classified - Plan: PT plan of care cert/re-cert   Pain in right ankle and joints of right foot - Plan: PT plan of care cert/re-cert   Rationale for Evaluation and Treatment: Rehabilitation   ONSET DATE: Chronic   SUBJECTIVE:    SUBJECTIVE STATEMENT: Amanda Larson reports great compliance with her home exercises.  She notes less pain and needing the cane less often.   She would like to be able to stand and walk with better endurance and less pain to complete normal activities around her house. Prolonged standing bothers Lt knee and Rt ankle.  Uses a "buggy" at [employer] or the grocery store to avoid standing for too long.   PERTINENT HISTORY: Bronchitis, chronic back pain, depression, diabetes, osteoarthritis PAIN:  Are you having pain? Yes: NPRS scale: 0-6/10 right ankle, 0/10 bilateral knees, 0-7 Lt hip. Pain location: Right ankle, medial ankle Pain description: Ache, sore and can be sharp Aggravating factors: Prolonged standing and walking Relieving factors: Rest   PRECAUTIONS: None   WEIGHT BEARING RESTRICTIONS: No  FALLS:  Has patient fallen in last 6 months? No   LIVING ENVIRONMENT: Lives with: lives with their family Lives in: House/apartment Stairs:  Avoids stairs Has following equipment at home: Single point cane   OCCUPATION: Disabled, tries to take care of the house, has trouble with certain chores due to wrist   PLOF: Independent with household mobility with device   PATIENT GOALS: Be able to be more independent with housework and have better standing and walking endurance with less right ankle pain   NEXT MD VISIT: ?   OBJECTIVE:     DIAGNOSTIC FINDINGS: IMPRESSION: 1. Mild tibiotalar osteoarthritis. 2. Normal variant os trigonum with associated osteoarthritis at the synchondrosis. 3. Large plantar calcaneal heel spur.   PATIENT SURVEYS:  FOTO Next visit (late start at evaluation)   COGNITION: Overall cognitive status: Within functional limits for tasks assessed                         SENSATION: Amanda Larson did not note any peripheral tingling or paresthesias     LOWER EXTREMITY ROM:   Active ROM Right 08/31/2022 Left 08/31/2022 09/27/2022  Hip flexion       Hip extension       Hip abduction       Hip adduction       Hip internal rotation       Hip external rotation       Knee flexion       Knee extension       Ankle dorsiflexion -8 -8 0/6  Ankle plantarflexion       Ankle inversion       Ankle eversion        (Blank rows = not tested)   LOWER EXTREMITY MMT:   MMT Left/Right in pounds 08/31/2022  Left/Right in pounds 09/27/2022  Hip flexion      Hip extension      Hip abduction      Hip adduction      Hip internal rotation      Hip external rotation      Knee flexion      Knee extension      Ankle dorsiflexion      Ankle plantarflexion      Ankle inversion 10.4/7.9  38.3/30.8   Ankle eversion 15.1/13.5  32.1/19.4   (Blank rows = not tested)   GAIT: Distance walked: Within the clinic Assistive device utilized: Single point cane Level of assistance: Complete Independence Comments: Amanda Larson was advised to use the cane in her left hand to help calm down right hand tendinitis and to more adequately unweight her right ankle     TODAY'S TREATMENT:                                                                       DATE:  09/27/2021 Quadriceps sets 2 sets of 10 for 5 seconds with Bil ankle dorsiflexion Bridging (limited-range) 10X 3 seconds Heel to toe raises seated 15X 3 seconds Seated knee flexion AAROM (right pushes left into flexion) 10X 5 seconds Seated hip flexion stretch (left foot  on 6 inch step, with good posture, lean your chest towards the left thigh) 10X 5 seconds  Neuromuscular re-education:  Wide balance 3X 20 seconds  Functional Activities:  Sit to stand 2 sets of 5X slow eccentrics   09/12/2021 Quadriceps sets 2 sets of 10 for 5 seconds with Bil ankle dorsiflexion Bridging (limited-range) 10X 3 seconds Heel to toe raises seated 15X 3 seconds  Neuromuscular re-education: Wide balance 2 sets of 3X 20 seconds  Functional Activities:  Sit to stand 2 sets of 5X slow eccentrics   08/31/2022 Quadriceps sets with pillow under both knees, encouraged bilateral ankle dorsiflexion 2 sets of 10 for 5 seconds (50+ per day recommended)     PATIENT EDUCATION:  Education details: We discussed keeping the cane in the left hand to unweight her right ankle and also help reduce overuse of the right hand Person educated: Patient Education method: Explanation, Demonstration, Tactile cues, Verbal cues, and Handouts Education comprehension: verbalized understanding, returned demonstration, verbal cues required, tactile cues required, and needs further education   HOME EXERCISE PROGRAM: Access Code: [email or web address] URL: [email or web address] Date: 09/27/2022 Prepared by: Pauletta Browns  Exercises - Supine Quadricep Sets  - 3-5 x daily - 7 x weekly - 1-2 sets - 10 reps - 5 second hold - Bridge  - 2 x daily - 7 x weekly - 1 sets - 10 reps - 3 seconds hold - Tandem Stance  - 2 x daily - 7 x weekly - 1 sets - 3 reps - 20 second hold - Seated Heel Toe Raises  - 3-5 x daily - 7 x weekly - 1 sets - 10 reps - Sit to Stand with Armchair  - 2 x daily - 7 x weekly - 1 sets - 5 reps - Seated Knee Flexion AAROM  - 2 x daily - 7 x weekly - 1 sets - 10 reps - 5 seconds hold   ASSESSMENT:   CLINICAL IMPRESSION: Amanda Larson reports great compliance with her home exercises.  With continued consistency, she is expected to meet her long-term goals.  She notes needing the cane less  often, feeling stronger and having less pain over the past several days.   OBJECTIVE IMPAIRMENTS: Abnormal gait, cardiopulmonary status limiting activity, decreased activity tolerance, decreased coordination, decreased endurance, decreased knowledge of condition, decreased mobility, difficulty walking, decreased ROM, decreased strength, decreased safety awareness, increased edema, impaired perceived functional ability, impaired flexibility, obesity, and pain.   ACTIVITY LIMITATIONS: carrying, lifting, bending, standing, squatting, stairs, bed mobility, and locomotion level   PARTICIPATION LIMITATIONS: meal prep, cleaning, laundry, shopping, and community activity   PERSONAL FACTORS: Bronchitis, chronic back pain, depression, diabetes, osteoarthritis are also affecting patient's functional outcome.    REHAB POTENTIAL: Good   CLINICAL DECISION MAKING: Stable/uncomplicated   EVALUATION COMPLEXITY: Low     GOALS: Goals reviewed with patient? Yes   SHORT TERM GOALS: Target date: 09/28/2022 Improve bilateral heel cords flexibility to 0 degrees Baseline: 8 degrees of plantarflexion bilaterally Goal status: Met 09/27/2022   2.  Improve bilateral ankle strength to at least 20 pounds inversion and eversion Baseline: 7 to 15 pounds Goal status: On Going 09/27/2022   3.  Amanda Larson will be comfortable with using her cane in her left hand to unweight her right ankle and allow her right hand to break to help calm tendonitis Baseline: Using cane in the right hand Goal status: On Going 09/27/2022     LONG TERM GOALS: Target date: 11/24/2022   Improve FOTO to long-term goal Baseline: To be established next visit as she started late today after her OT appointment Goal status: INITIAL  2.  Improve right ankle pain to consistently less than 4 out of 10 on the visual analog scale Baseline: Can be 9 out of 10 Goal status: On Going 09/27/2022   3.  Improve bilateral ankle dorsiflexion active range of  motion to 10 degrees Baseline: -8 degrees Goal status: On Going 09/27/2022   4.  Improve bilateral ankle strength for inversion and eversion to at least 40 pounds Baseline: 15 pounds or less Goal status: On Going 09/27/2022   5.  Amanda Larson will report improved standing and walking endurance Baseline: Poor standing and walking endurance at evaluation Goal status: Met 09/27/2022   6.  Amanda Larson will be independent with a long-term home exercise program at discharge Baseline: Started 08/31/2022 Goal status: On Going 09/27/2022     PLAN:   PT FREQUENCY: 1x/week   PT DURATION: 12 weeks   PLANNED INTERVENTIONS: Therapeutic exercises, Therapeutic activity, Neuromuscular re-education, Balance training, Gait training, Patient/Family education, Self Care, Stair training, and Cryotherapy   PLAN FOR NEXT SESSION: Review HEP, check progress with getting socks and shoes on (hip and knee flexion), consider progressions to balance training for ankle, knee and hip strengthening, consider general lower extremity strengthening progressions with emphasis on the ankle and quadriceps.  Get a FOTO!!!      Cherlyn Cushing, PT, MPT 09/27/2022, 4:03 PM    PHYSICAL THERAPY DISCHARGE SUMMARY  Visits from Start of Care: 3  Current functional level related to goals / functional outcomes: See note   Remaining deficits: See note   Education / Equipment: HEP  Patient goals were partially met. Patient is being discharged due to not returning since the last visit.   Chyrel Masson, PT, DPT, OCS, ATC 03/26/23  2:13 PM

## 2022-10-02 ENCOUNTER — Ambulatory Visit: Payer: Medicare HMO | Admitting: Family Medicine

## 2022-10-02 ENCOUNTER — Telehealth: Payer: Self-pay

## 2022-10-02 ENCOUNTER — Other Ambulatory Visit: Payer: Self-pay | Admitting: Student

## 2022-10-02 ENCOUNTER — Encounter: Payer: Medicare HMO | Admitting: Rehabilitative and Restorative Service Providers"

## 2022-10-02 DIAGNOSIS — M545 Low back pain, unspecified: Secondary | ICD-10-CM

## 2022-10-02 MED ORDER — TRAMADOL HCL 50 MG PO TABS: 50.0000 mg | ORAL_TABLET | Freq: Every day | ORAL | 0 refills | Status: DC

## 2022-10-02 NOTE — Telephone Encounter (Signed)
Patient calls nurse line reporting difficulty refilling Tramadol prescription.   Refill was due to be filled on 09/30/2022.   I called the pharmacy and they report her medicare plan will not cover controlled substances sent in by "residents" as of September 10 2022.  Will forward to morning preceptor to fill.

## 2022-10-08 NOTE — Telephone Encounter (Signed)
Received notification from [employer] regarding approval for [place]. Patient assistance approved from 10/05/22 to 09/10/23.  MEDICATION WILL SHIP TO OFFICE  Phone: [phone]

## 2022-10-08 NOTE — Therapy (Incomplete)
OUTPATIENT OCCUPATIONAL THERAPY TREATMENT NOTE  Patient Name: Amanda Larson MRN: [id number] DOB:04/06/1966, 57 y.o., female Today's Date: 10/08/2022  PCP: Erskine Emery, MD  REFERRING PROVIDER:  Gregor Hams, MD    END OF SESSION:    Past Medical History:  Diagnosis Date   Bronchitis    Chronic back pain    Depression    Diabetes mellitus without complication [facility])    Osteoarthritis    Past Surgical History:  Procedure Laterality Date   CESAREAN SECTION     CHOLECYSTECTOMY     TUBAL LIGATION     Patient Active Problem List   Diagnosis Date Noted   Acute right ankle pain 08/14/2022   Right wrist pain 07/24/2022   Pain of toe of right foot 04/27/2022   Type 2 diabetes mellitus with neurological complications ([place]) 98/33/8250   Bipolar I disorder ([place]) 03/30/2020   Recurrent major depressive disorder, in partial remission ([place]) 03/30/2020   Generalized anxiety disorder 03/30/2020   Obstructive sleep apnea 11/21/2018   Osteoarthritis of left knee 05/15/2018   Gastro-esophageal reflux 05/15/2018   Hyperlipidemia 12/10/2017   Type 2 diabetes mellitus with hyperglycemia ([place]) 11/18/2017   Depression 02/08/2017   Chronic low back pain 02/08/2017   Asthma 02/08/2017   Psoriasis 02/08/2017   Healthcare maintenance 02/08/2017    ONSET DATE: ~6 months (June 2023)   REFERRING DIAG: M25.531 (ICD-10-CM) - Right wrist pain   THERAPY DIAG:  No diagnosis found.  Rationale for Evaluation and Treatment: Rehabilitation  PERTINENT HISTORY: Per MD Note: "Pt reports R wrist pain x 6 months. Pt is R-hand dominate. Pt locates pain to the radial aspect of the wrist, into 1st MC, into thumb, and along the 5th [facility]." She is also seeking PT tx for Rt ankle pain for 2-3 months.  Per OT eval: "states having a wrist support that hurts her because it feels to tight, so she is not wearing it and hasn't been wearing it. She states ulnar nerve numbness in Rt hand at night at times, pain  from upper/back neck to down shoulder into forearm and hand at times.  Thumb basal joint and wrist crease also painful and tender, but the worst pain in on the ulnar side of her wrist and hand which is also significantly swollen. "   PRECAUTIONS: Fall;  WEIGHT BEARING RESTRICTIONS: No   SUBJECTIVE:   SUBJECTIVE STATEMENT: She states ***   feeling better, less pain now and can touch Rt thumb to small finger tip!    PAIN:  Are you having pain? *** Yes in Rt wrist Rating: 4/10 at rest now, up to 9/10 at worst in past week in mornings     FALLS: Has patient fallen in last 6 months? Yes. Number of falls fell once in July at the pool onto L shoulder  PATIENT GOALS:  decrease pain in rt dominant hand/arm   OBJECTIVE: (All objective assessments below are from initial evaluation on: 08/31/22 unless otherwise specified.)   HAND DOMINANCE: Right   ADLs: Overall ADLs: States decreased ability to grab, hold household objects, pain and inability to open containers, perform FMS tasks (manipulate fasteners on clothing), mild to moderate bathing problems as well.    FUNCTIONAL OUTCOME MEASURES: Eval: Patient Rated Wrist Evaluation (PRWE): Pain: 46/50; Function: 42.5/50; Total Score: 88.5/100 (Higher Score  =  More Pain and/or Debility)    UPPER EXTREMITY ROM    At eval she she showed poor rounded shoulder postures, noticeably limited shoulder range of motion  as well as wrist and hand range of motion on the right side.  Not all details could be measured today due to severity and number of complaints.  Details to be determined next session.  Shoulder to Wrist AROM Right 09/11/22 Rt TBD  Shoulder flexion 118   Shoulder abduction 91   Shoulder extension 36   Shoulder internal rotation Pants line   Shoulder external rotation 70   Elbow flexion 146   Elbow extension 0   Forearm supination 38   Forearm pronation  85   Wrist flexion 35   Wrist extension 30   Wrist ulnar deviation 15   Wrist  radial deviation 21   (Blank rows = not tested)   Hand AROM Right eval  Full Fist Ability (or Gap to Distal Palmar Crease) No- ~1cm gap from digits 2-4, and 2cm gap digit 5  Thumb Opposition  (Kapandji Scale)  5  Thumb MCP (0-60) 0-45  Thumb IP (0-80) 0-37  Thumb Palmar Abduction Span 5.5 cm span  Little MCP (0-90) 0 -  54  Little PIP (0-100) (-37) -  57  Little DIP (0-70)  (-6) - 22  (Blank rows = not tested)   UPPER EXTREMITY MMT:    This was not tested at evaluation due to acute and chronic pains and limited time available.  Details to be determined, but at the wrist and hand she was grossly 3-5 MMT due to apparent limited motion  MMT Right TBD  Shoulder flexion   Shoulder abduction   Shoulder adduction   Shoulder extension   Shoulder internal rotation   Shoulder external rotation   Middle trapezius   Lower trapezius   Elbow flexion   Elbow extension   Forearm supination   Forearm pronation   Wrist flexion   Wrist extension   Wrist ulnar deviation   Wrist radial deviation   (Blank rows = not tested)  HAND FUNCTION: Eval: Observed weakness in affected Rt hand.  Grip strength Right: TBD lbs, Left: TBD lbs   COORDINATION: 09/11/22: Box and Blocks Test: RIGHT 42 Blocks today (56 is WFL)   SENSATION: Eval:  Light touch intact today, though median nerve had 34mm static 2-pt discrimination and ulnar tested 69mm.   She was positive Tinel test at elbow (for ulnar) and guyon's canal.   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  The fact that she holds her cane in her right hand, applying  pressure and leaning on the side is obviously exacerbating all of these issues.   TODAY'S TREATMENT:  10/11/22: ***Check orthotic as needed, review full home exercise program from shoulder to hand, take grip strength and other new range of motion measures to check status.  09/27/22: She did not bring orthotic to be checked, but states that it fits perfectly and does not need adjusted.  She was still told to wear this to help her arm rest from her tendinitis as needed for any heavy tasks.  Additionally OT reviews her initial shoulder stretches with her, then educates thoroughly on the remaining 8 exercises in her home exercise program-including ulnar nerve glides to help reeducate likely  compressed ulnar nerve (as bolded below).  She performs each of these back to OT in detail carefully as to not add additional pain.  She tolerates these well and is encouraged to do them at least 2 or 3 times a day as able nonpainfully.  Again she was reminded to use heat or ice modalities as needed as well.    Exercises - Seated Scapular Retraction  - 4 x daily - 5-10 reps - Seated Shoulder Flexion Towel Slide at Table Top  - 4 x daily - 3-5 reps - 15 hold - Seated Shoulder External Rotation PROM on Table  - 3-4 x daily - 3-5 reps - 15 sec hold - Standing Shoulder Internal Rotation AAROM with Dowel  - 4-6 x daily - 1 sets - 10-15 reps - Standing Shoulder Extension ROM with Dowel  - 4 x daily - 10-15 reps - Forearm Supination Stretch  - 3-4 x daily - 3-5 reps - 15 sec hold - Bend and Pull Back Wrist SLOWLY  - 4 x daily - 10-15 reps - Wrist Flexion Stretch  - 4 x daily - 3-5 reps - 15 sec hold - Wrist Prayer Stretch  - 4 x daily - 3-5 reps - 15 sec hold - Seated Wrist Ulnar Deviation Stretch  - 3-5 x daily - 3-5 reps - 15 sec hold - BACK KNUCKLE STRETCHES   - 4 x daily - 3-5 reps - 15 sec hold - Thumb stretch  - 4 x daily - 3-5 reps - 15-20 sec hold - Ulnar Nerve Flossing  - 3-4 x daily - 1-2 sets - 5-10  reps  PATIENT EDUCATION: Education details: See tx section above for details  Person educated: Patient Education method: Verbal Instruction, Teach back, Handouts  Education comprehension: States and demonstrates understanding, Additional Education required    HOME EXERCISE PROGRAM: Access Code: [email or web address] URL: [email or web address] Date: 09/11/2022 Prepared by: Fannie Knee   GOALS: Goals reviewed with patient? Yes   SHORT TERM GOALS: (STG required if POC>30 days) Target Date: 09/14/22  Pt will obtain protective, custom orthotic. Goal status: 09/11/22 MET 2.  Pt will demo/state understanding of initial HEP to improve pain levels and prerequisite motion. Goal status: Progressing   LONG TERM GOALS: Target Date: 11/09/22  Pt will improve functional ability by decreased impairment per PRWE assessment from 88.5/100 to 35/100 or better, for better quality of life. Goal status: INITIAL  2.  Pt will improve grip strength in Rt hand to at least non-painful 30lbs for functional use at home and in IADLs. Goal status: INITIAL  3.  Pt will improve A/ROM in Rt SF TAM from 90* to at least 180*, to improve functional motion for tasks like reach and grasp.  Goal status: INITIAL  4.  Pt will improve strength in Rt wrist extension from painful 3-/5 MMT to at least 4/5 MMT to have increased functional ability to carry out selfcare and higher-level homecare tasks with no difficulty. Goal status: INITIAL  5.  Pt will improve coordination skills in Rt arm, as seen by [facility] score on Box and Blocks testing to have increased functional ability to carry out fine motor tasks (fasteners, etc.) and more complex, coordinated IADLs (meal prep, sports, etc.).  Goal status: INITIAL  6.  Pt will decrease pain at rest from 8/10 to 3/10 or better to have better sleep and occupational participation in daily roles. Goal status: INITIAL   ASSESSMENT:  CLINICAL IMPRESSION: 10/11/22: ***  09/27/22:  She now has  a full home exercise program from shoulder to hand, a good fitting orthotic to help her rest, and the education to prevent pain and hopefully decrease symptoms and increase functional ability.  Eventually we will need to upgrade to strengthening portions when her pain is consistently lower and her mobility is better.    PLAN:  OT FREQUENCY: 2x/week  OT DURATION: 10 weeks (through 11/09/22 as needed)   PLANNED INTERVENTIONS: self care/ADL training, therapeutic exercise, therapeutic activity, neuromuscular re-education, manual therapy, passive range of motion, splinting, electrical stimulation, ultrasound, fluidotherapy, compression bandaging, moist heat, cryotherapy, contrast bath, patient/family education, and coping strategies training  RECOMMENDED OTHER SERVICES: She is also seeing PT for LE pain  CONSULTED AND AGREED WITH PLAN OF CARE: Patient  PLAN FOR NEXT SESSION:  ***  Benito Mccreedy, OTR/L, CHT 10/08/2022, 5:38 PM

## 2022-10-11 ENCOUNTER — Encounter: Payer: Medicare HMO | Admitting: Rehabilitative and Restorative Service Providers"

## 2022-10-12 ENCOUNTER — Ambulatory Visit (INDEPENDENT_AMBULATORY_CARE_PROVIDER_SITE_OTHER): Payer: Medicare HMO | Admitting: Family Medicine

## 2022-10-12 ENCOUNTER — Encounter: Payer: Self-pay | Admitting: Family Medicine

## 2022-10-12 ENCOUNTER — Other Ambulatory Visit: Payer: Self-pay

## 2022-10-12 VITALS — BP 129/92 | HR 78 | Ht 61.0 in | Wt 298.4 lb

## 2022-10-12 DIAGNOSIS — R319 Hematuria, unspecified: Secondary | ICD-10-CM | POA: Diagnosis not present

## 2022-10-12 DIAGNOSIS — J441 Chronic obstructive pulmonary disease with (acute) exacerbation: Secondary | ICD-10-CM

## 2022-10-12 DIAGNOSIS — R052 Subacute cough: Secondary | ICD-10-CM

## 2022-10-12 LAB — POCT URINALYSIS DIP (MANUAL ENTRY)
Glucose, UA: NEGATIVE mg/dL
Ketones, POC UA: NEGATIVE mg/dL
Leukocytes, UA: NEGATIVE
Nitrite, UA: NEGATIVE
Protein Ur, POC: NEGATIVE mg/dL
Spec Grav, UA: >=1.03 — AB (ref 1.010–1.025)
Urobilinogen, UA: 1 E.U./dL
pH, UA: 5 (ref 5.0–8.0)

## 2022-10-12 MED ORDER — PREDNISONE 20 MG PO TABS: 40.0000 mg | ORAL_TABLET | Freq: Every day | ORAL | 0 refills | Status: DC

## 2022-10-12 MED ORDER — DOXYCYCLINE HYCLATE 100 MG PO TABS: 100.0000 mg | ORAL_TABLET | Freq: Every day | ORAL | 0 refills | Status: DC

## 2022-10-12 NOTE — Progress Notes (Unsigned)
    SUBJECTIVE:   CHIEF COMPLAINT / HPI: Cough   Started coughing 2 weeks ago. No fevers. Some diarrhea but normal for her with her ozempic. Some post-tussive vomiting. Coughing green phlegm. Can't walk far gets out pf breath. Using albuterol inhaler frequently about 4-5 times a day. 2 puffs every time. It has been helping when using it. No steroid inhaler. No daily inhalers. When she is not sick she has to use it maybe 2-3 in a year mostly when seasons change and she has worsening allergies. No sick contacts she knows of.   This morning she noticed blood when she wiped. Again twice she had blood in bowl and tinged when she wiped. Hasn't had a period for 3 years. She is due for a pap smear. Has not had sex recently. Has not placed anything in the vagina. Some burning in vaginal area on and off for some weeks. She said it went away with drinking water and taking azo. No unintentional weight loss. No night sweats. Have been having camping pain when she has to have a bowel movement. Resolves after having bowel movement. No other abdominal pain.   Smoked 0.5ppd since 57 yo until 58 yo.   PERTINENT  PMH / PSH: Asthma, Obesity, OSA, T2DM, Psoriasis   OBJECTIVE:   Wt 298 lb 6.4 oz (135.4 kg)   LMP 01/17/2014   BMI 56.38 kg/m   ***  ASSESSMENT/PLAN:   No problem-specific Assessment & Plan notes found for this encounter.     Lowry Ram, MD [place]

## 2022-10-13 LAB — URINALYSIS, MICROSCOPIC ONLY
Casts: NONE SEEN /lpf
Epithelial Cells (non renal): >10 /hpf — AB (ref 0–10)
RBC, Urine: NONE SEEN /hpf (ref 0–2)

## 2022-10-14 DIAGNOSIS — R319 Hematuria, unspecified: Secondary | ICD-10-CM | POA: Insufficient documentation

## 2022-10-14 DIAGNOSIS — R059 Cough, unspecified: Secondary | ICD-10-CM | POA: Insufficient documentation

## 2022-10-14 NOTE — Assessment & Plan Note (Signed)
Patient most likely has viral URI. High likelihood that patient has some level of COPD. Presentation seems very similar to COPD exacerbation and patient has long history of smoking though is not currently smoking. However, she is stable as ambulating pulse ox remained above 92%.  - prednisone 40 mg for 5 days  - doxycyline for 5 days  - patient declined steroid inhaler due to cost  - continue to use albuterol  - return precautions given regarding worsening shortness of breath  - F/u w PCP on 2/12

## 2022-10-14 NOTE — Assessment & Plan Note (Signed)
Unsure if patient is having hematuria or vaginal bleeding. Patient is postmenopausal. She does state that she has some burning when she urinates making hematuria more likely. Does not have history concerning for kidney stone. Patient declined trying to place a tampon at home to test for vaginal vs bladder bleeding. She also declined GU exam at this time. She is currently afebrile without CVA tenderness, not concerned for pyelonephritis.  - Urinalysis  - F/u w PCP on 2/12 when patient accepted to GU exam with pap smear

## 2022-10-15 ENCOUNTER — Ambulatory Visit (INDEPENDENT_AMBULATORY_CARE_PROVIDER_SITE_OTHER): Payer: Medicare HMO | Admitting: Family Medicine

## 2022-10-15 DIAGNOSIS — Z0000 Encounter for general adult medical examination without abnormal findings: Secondary | ICD-10-CM | POA: Diagnosis not present

## 2022-10-15 NOTE — Progress Notes (Unsigned)
Reviewed the Medications, Problem list, Past Medical, Surgical Histories, Family Histories, and Social Histories.

## 2022-10-15 NOTE — Patient Instructions (Signed)

## 2022-10-15 NOTE — Progress Notes (Signed)
I connected with  Amanda Larson on 10/15/22 by a audio enabled telemedicine application and verified that I am speaking with the correct person using two identifiers.  Patient Location: Home  Provider Location: Office/Clinic  I discussed the limitations of evaluation and management by telemedicine. The patient expressed understanding and agreed to proceed.  Subjective:   Amanda Larson is a 57 y.o. female who presents for an Initial Medicare Annual Wellness Visit.  Review of Systems     Per HPI unless specifically indicated below.  Cardiac Risk Factors include: advanced age (>21mn, >>36women);female gender        Objective:       10/12/2022    4:16 PM 08/21/2022    2:59 PM 08/14/2022    3:49 PM  Vitals with BMI  Height 5' 1"$  5' 1"$  5' 1"$   Weight 298 lbs 6 oz 305 lbs 305 lbs 13 oz  BMI 56.41 [id number] Systolic [id number] Diastolic 92 86 70  Pulse 78 73 79    There were no vitals filed for this visit. There is no height or weight on file to calculate BMI.     10/12/2022    4:17 PM 08/31/2022   11:43 AM 07/23/2022    2:13 PM 04/27/2022   10:05 AM 01/25/2022    2:29 PM 11/01/2021    2:22 PM 07/31/2021    2:46 PM  Advanced Directives  Does Patient Have a Medical Advance Directive? No No No No No No No  Would patient like information on creating a medical advance directive? No - Patient declined Yes (MAU/Ambulatory/Procedural Areas - Information given) No - Patient declined No - Patient declined No - Patient declined No - Patient declined No - Patient declined    Current Medications (verified) Outpatient Encounter Medications as of 10/15/2022  Medication Sig   metFORMIN (GLUCOPHAGE-XR) 500 MG 24 hr tablet TAKE 4 TABLETS BY MOUTH ONCE DAILY   albuterol (VENTOLIN HFA) 108 (90 Base) MCG/ACT inhaler Inhale 2 puffs into the lungs every 6 (six) hours as needed for wheezing or shortness of breath.   citalopram (CELEXA) 40 MG tablet TAKE 1 TABLET (40 MG TOTAL) BY MOUTH  DAILY.   Diclofenac Sodium 3 % GEL Apply 2 g topically in the morning, at noon, in the evening, and at bedtime.   doxycycline (VIBRA-TABS) 100 MG tablet Take 1 tablet (100 mg total) by mouth daily.   hydrOXYzine (ATARAX) 25 MG tablet TAKE 1 TABLET (25 MG TOTAL) BY MOUTH 3 (THREE) TIMES DAILY AS NEEDED FOR ANXIETY.   meloxicam (MOBIC) 15 MG tablet TAKE 1 TABLET BY MOUTH ONCE DAILY . APPOINTMENT REQUIRED FOR FUTURE REFILLS   Omega-3 Fatty Acids (FISH OIL) 1000 MG CAPS Take by mouth.   predniSONE (DELTASONE) 20 MG tablet Take 2 tablets (40 mg total) by mouth daily with breakfast.   pregabalin (LYRICA) 100 MG capsule Take 1 capsule by mouth twice daily   Semaglutide, 2 MG/DOSE, (OZEMPIC, 2 MG/DOSE,) 8 MG/3ML SOPN Inject 2 mg into the skin once a week.   simvastatin (ZOCOR) 40 MG tablet TAKE 1 TABLET BY MOUTH AT BEDTIME   traMADol (ULTRAM) 50 MG tablet Take 1 tablet (50 mg total) by mouth daily.   traZODone (DESYREL) 50 MG tablet TAKE 1 TABLET (50 MG TOTAL) BY MOUTH AT BEDTIME.   No facility-administered encounter medications on file as of 10/15/2022.    Allergies (verified) Sulfa antibiotics and Clarithromycin   History: Past Medical History:  Diagnosis  Date   Bronchitis    Chronic back pain    Depression    Diabetes mellitus without complication ([place])    Osteoarthritis    Past Surgical History:  Procedure Laterality Date   CESAREAN SECTION     CHOLECYSTECTOMY     TUBAL LIGATION     Family History  Problem Relation Age of Onset   Cancer Father        skin   Heart disease Paternal Grandfather    Stroke Paternal Grandfather    Drug abuse Paternal Grandfather    High Cholesterol Mother    Diabetes Maternal Aunt    Hypertension Maternal Aunt    Diabetes Maternal Uncle    Social History   Socioeconomic History   Marital status: Significant Other    Spouse name: Not on file   Number of children: 3   Years of education: Not on file   Highest education level: Some college, no  degree  Occupational History   Not on file  Tobacco Use   Smoking status: Former    Types: Cigarettes    Quit date: 07/31/2007    Years since quitting: 15.2    Passive exposure: Past   Smokeless tobacco: Never  Vaping Use   Vaping Use: Never used  Substance and Sexual Activity   Alcohol use: Yes    Comment: 3 drinks a year    Drug use: Yes    Types: Marijuana   Sexual activity: Yes    Birth control/protection: Surgical  Other Topics Concern   Not on file  Social History Narrative   Not on file   Social Determinants of Health   Financial Resource Strain: Not on file  Food Insecurity: Not on file  Transportation Needs: No Transportation Needs (12/30/2018)   PRAPARE - Hydrologist (Medical): No    Lack of Transportation (Non-Medical): No  Physical Activity: Not on file  Stress: Not on file  Social Connections: Not on file    Tobacco Counseling Counseling given: Not Answered   Clinical Intake:                 Diabetic?***         Activities of Daily Living     No data to display          Patient Care Team: Erskine Emery, MD as PCP - General (Family Medicine)  Indicate any recent Medical Services you may have received from other than [facility] providers in the past year (date may be approximate).     Assessment:   This is a routine wellness examination for Amanda Larson.  Hearing/Vision screen No results found.  Dietary issues and exercise activities discussed:     Goals Addressed   None    Depression Screen    10/12/2022    4:17 PM 08/20/2022   12:30 PM 08/14/2022    4:30 PM 07/23/2022    2:13 PM 05/25/2022    9:58 AM 04/27/2022   10:05 AM 02/23/2022   10:43 AM  PHQ 2/9 Scores  PHQ - 2 Score 2  2 3  2   $ PHQ- 9 Score 7  7 6  8      $ Information is confidential and restricted. Go to Review Flowsheets to unlock data.    Fall Risk    10/12/2022    4:17 PM 07/23/2022    2:13 PM 11/01/2021    2:22 PM 03/10/2020    10:20 AM 10/28/2019    1:48  PM  [place] in the past year? 0 0 0 0 0  Number falls in past yr: 0 0 0 0   Injury with Fall? 0 0 0 0     FALL RISK PREVENTION PERTAINING TO THE HOME:  Any stairs in or around the home? No  If so, are there any without handrails? No  Home free of loose throw rugs in walkways, pet beds, electrical cords, etc? Yes  Adequate lighting in your home to reduce risk of falls? Yes   ASSISTIVE DEVICES UTILIZED TO PREVENT FALLS:  Life alert? No  Use of a cane, walker or w/c? Yes  Grab bars in the bathroom? No  Shower chair or bench in shower? No  Elevated toilet seat or a handicapped toilet? No   TIMED UP AND GO:  Was the test performed? ***.  Length of time to ambulate 10 feet: *** sec.   {Appearance of YN:[id number]  Cognitive Function:        Immunizations Immunization History  Administered Date(s) Administered   Influenza,inj,Quad PF,6+ Mos 07/31/2021   PFIZER(Purple Top)SARS-COV-2 Vaccination 04/20/2020, 05/11/2020   Tdap 11/04/2020    {TDAP status:2101805}  {Flu Vaccine status:2101806}  {Pneumococcal vaccine status:2101807}  {Covid-19 vaccine status:2101808}  Qualifies for Shingles Vaccine? {YES/NO:21197}  Zostavax completed {YES/NO:21197}  {Shingrix Completed?:2101804}  Screening Tests Health Maintenance  Topic Date Due   Medicare Annual Wellness (AWV)  Never done   Zoster Vaccines- Shingrix (1 of 2) Never done   PAP SMEAR-Modifier  04/15/2020   OPHTHALMOLOGY EXAM  10/31/2021   FOOT EXAM  01/19/2022   INFLUENZA VACCINE  04/10/2022   COVID-19 Vaccine (3 - 2023-24 season) 05/11/2022   MAMMOGRAM  01/13/2023   HEMOGLOBIN A1C  01/21/2023   Diabetic kidney evaluation - eGFR measurement  04/28/2023   Diabetic kidney evaluation - Urine ACR  04/28/2023   Fecal DNA (Cologuard)  11/23/2023   DTaP/Tdap/Td (2 - Td or Tdap) 11/04/2030   Hepatitis C Screening  Completed   HIV Screening  Completed   HPV VACCINES  Aged Out     Health Maintenance  Health Maintenance Due  Topic Date Due   Medicare Annual Wellness (AWV)  Never done   Zoster Vaccines- Shingrix (1 of 2) Never done   PAP SMEAR-Modifier  04/15/2020   OPHTHALMOLOGY EXAM  10/31/2021   FOOT EXAM  01/19/2022   INFLUENZA VACCINE  04/10/2022   COVID-19 Vaccine (3 - 2023-24 season) 05/11/2022    {Colorectal cancer screening:2101809}  {Mammogram status:21018020}  {Bone Density status:21018021}  Lung Cancer Screening: (Low Dose CT Chest recommended if Age 25-80 years, 30 pack-year currently smoking OR have quit w/in 15years.) {DOES NOT does:27190::"does not"} qualify.   Lung Cancer Screening Referral: ***  Additional Screening:  Hepatitis C Screening: {DOES NOT does:27190::"does not"} qualify; Completed ***  Vision Screening: Recommended annual ophthalmology exams for early detection of glaucoma and other disorders of the eye. Is the patient up to date with their annual eye exam?  {YES/NO:21197} Who is the provider or what is the name of the office in which the patient attends annual eye exams? *** If pt is not established with a provider, would they like to be referred to a provider to establish care? {YES/NO:21197}.   Dental Screening: Recommended annual dental exams for proper oral hygiene  Community Resource Referral / Chronic Care Management: CRR required this visit?  {YES/NO:21197}  CCM required this visit?  {YES/NO:21197}     Plan:     I have personally  reviewed and noted the following in the patient's chart:   Medical and social history Use of alcohol, tobacco or illicit drugs  Current medications and supplements including opioid prescriptions. {Opioid Prescriptions:[phone]} Functional ability and status Nutritional status Physical activity Advanced directives List of other physicians Hospitalizations, surgeries, and ER visits in previous 12 months Vitals Screenings to include cognitive, depression, and  falls Referrals and appointments  In addition, I have reviewed and discussed with patient certain preventive protocols, quality metrics, and best practice recommendations. A written personalized care plan for preventive services as well as general preventive health recommendations were provided to patient.     Wilson Singer, [place]   10/15/2022   Nurse Notes: ***

## 2022-10-16 ENCOUNTER — Encounter: Payer: Self-pay | Admitting: Family Medicine

## 2022-10-17 ENCOUNTER — Encounter: Payer: Self-pay | Admitting: Family Medicine

## 2022-10-17 NOTE — Therapy (Incomplete)
OUTPATIENT OCCUPATIONAL THERAPY TREATMENT NOTE  Patient Name: Amanda Larson MRN: [id number] DOB:1966/02/26, 57 y.o., female Today's Date: 10/08/2022  PCP: Erskine Emery, MD  REFERRING PROVIDER:  Gregor Hams, MD    END OF SESSION:    Past Medical History:  Diagnosis Date   Bronchitis    Chronic back pain    Depression    Diabetes mellitus without complication [facility])    Osteoarthritis    Past Surgical History:  Procedure Laterality Date   CESAREAN SECTION     CHOLECYSTECTOMY     TUBAL LIGATION     Patient Active Problem List   Diagnosis Date Noted   Acute right ankle pain 08/14/2022   Right wrist pain 07/24/2022   Pain of toe of right foot 04/27/2022   Type 2 diabetes mellitus with neurological complications ([place]) 85/27/7824   Bipolar I disorder ([place]) 03/30/2020   Recurrent major depressive disorder, in partial remission ([place]) 03/30/2020   Generalized anxiety disorder 03/30/2020   Obstructive sleep apnea 11/21/2018   Osteoarthritis of left knee 05/15/2018   Gastro-esophageal reflux 05/15/2018   Hyperlipidemia 12/10/2017   Type 2 diabetes mellitus with hyperglycemia ([place]) 11/18/2017   Depression 02/08/2017   Chronic low back pain 02/08/2017   Asthma 02/08/2017   Psoriasis 02/08/2017   Healthcare maintenance 02/08/2017    ONSET DATE: ~6 months (June 2023)   REFERRING DIAG: M25.531 (ICD-10-CM) - Right wrist pain   THERAPY DIAG:  No diagnosis found.  Rationale for Evaluation and Treatment: Rehabilitation  PERTINENT HISTORY: Per MD Note: "Pt reports R wrist pain x 6 months. Pt is R-hand dominate. Pt locates pain to the radial aspect of the wrist, into 1st MC, into thumb, and along the 5th [facility]." She is also seeking PT tx for Rt ankle pain for 2-3 months.  Per OT eval: "states having a wrist support that hurts her because it feels to tight, so she is not wearing it and hasn't been wearing it. She states ulnar nerve numbness in Rt hand at night at times, pain  from upper/back neck to down shoulder into forearm and hand at times.  Thumb basal joint and wrist crease also painful and tender, but the worst pain in on the ulnar side of her wrist and hand which is also significantly swollen. "   PRECAUTIONS: Fall;  WEIGHT BEARING RESTRICTIONS: No   SUBJECTIVE:   SUBJECTIVE STATEMENT: She states ***   feeling better, less pain now and can touch Rt thumb to small finger tip!    PAIN:  Are you having pain? *** Yes in Rt wrist Rating: 4/10 at rest now, up to 9/10 at worst in past week in mornings     FALLS: Has patient fallen in last 6 months? Yes. Number of falls fell once in July at the pool onto L shoulder  PATIENT GOALS:  decrease pain in rt dominant hand/arm   OBJECTIVE: (All objective assessments below are from initial evaluation on: 08/31/22 unless otherwise specified.)   HAND DOMINANCE: Right   ADLs: Overall ADLs: States decreased ability to grab, hold household objects, pain and inability to open containers, perform FMS tasks (manipulate fasteners on clothing), mild to moderate bathing problems as well.    FUNCTIONAL OUTCOME MEASURES: Eval: Patient Rated Wrist Evaluation (PRWE): Pain: 46/50; Function: 42.5/50; Total Score: 88.5/100 (Higher Score  =  More Pain and/or Debility)    UPPER EXTREMITY ROM    At eval she she showed poor rounded shoulder postures, noticeably limited shoulder range of motion  as well as wrist and hand range of motion on the right side.  Not all details could be measured today due to severity and number of complaints.  Details to be determined next session.  Shoulder to Wrist AROM Right 09/11/22 Rt TBD  Shoulder flexion 118   Shoulder abduction 91   Shoulder extension 36   Shoulder internal rotation Pants line   Shoulder external rotation 70   Elbow flexion 146   Elbow extension 0   Forearm supination 38   Forearm pronation  85   Wrist flexion 35   Wrist extension 30   Wrist ulnar deviation 15   Wrist  radial deviation 21   (Blank rows = not tested)   Hand AROM Right eval  Full Fist Ability (or Gap to Distal Palmar Crease) No- ~1cm gap from digits 2-4, and 2cm gap digit 5  Thumb Opposition  (Kapandji Scale)  5  Thumb MCP (0-60) 0-45  Thumb IP (0-80) 0-37  Thumb Palmar Abduction Span 5.5 cm span  Little MCP (0-90) 0 -  54  Little PIP (0-100) (-37) -  57  Little DIP (0-70)  (-6) - 22  (Blank rows = not tested)   UPPER EXTREMITY MMT:    This was not tested at evaluation due to acute and chronic pains and limited time available.  Details to be determined, but at the wrist and hand she was grossly 3-5 MMT due to apparent limited motion  MMT Right TBD  Shoulder flexion   Shoulder abduction   Shoulder adduction   Shoulder extension   Shoulder internal rotation   Shoulder external rotation   Middle trapezius   Lower trapezius   Elbow flexion   Elbow extension   Forearm supination   Forearm pronation   Wrist flexion   Wrist extension   Wrist ulnar deviation   Wrist radial deviation   (Blank rows = not tested)  HAND FUNCTION: Eval: Observed weakness in affected Rt hand.  Grip strength Right: TBD lbs, Left: TBD lbs   COORDINATION: 09/11/22: Box and Blocks Test: RIGHT 42 Blocks today (56 is WFL)   SENSATION: Eval:  Light touch intact today, though median nerve had 1mm static 2-pt discrimination and ulnar tested 69mm.   She was positive Tinel test at elbow (for ulnar) and guyon's canal.   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  The fact that she holds her cane in her right hand, applying  pressure and leaning on the side is obviously exacerbating all of these issues.   TODAY'S TREATMENT:  10/18/22: ***Check orthotic as needed, review full home exercise program from shoulder to hand, take grip strength and other new range of motion measures to check status.  09/27/22: She did not bring orthotic to be checked, but states that it fits perfectly and does not need adjusted.  She was still told to wear this to help her arm rest from her tendinitis as needed for any heavy tasks.  Additionally OT reviews her initial shoulder stretches with her, then educates thoroughly on the remaining 8 exercises in her home exercise program-including ulnar nerve glides to help reeducate likely  compressed ulnar nerve (as bolded below).  She performs each of these back to OT in detail carefully as to not add additional pain.  She tolerates these well and is encouraged to do them at least 2 or 3 times a day as able nonpainfully.  Again she was reminded to use heat or ice modalities as needed as well.    Exercises - Seated Scapular Retraction  - 4 x daily - 5-10 reps - Seated Shoulder Flexion Towel Slide at Table Top  - 4 x daily - 3-5 reps - 15 hold - Seated Shoulder External Rotation PROM on Table  - 3-4 x daily - 3-5 reps - 15 sec hold - Standing Shoulder Internal Rotation AAROM with Dowel  - 4-6 x daily - 1 sets - 10-15 reps - Standing Shoulder Extension ROM with Dowel  - 4 x daily - 10-15 reps - Forearm Supination Stretch  - 3-4 x daily - 3-5 reps - 15 sec hold - Bend and Pull Back Wrist SLOWLY  - 4 x daily - 10-15 reps - Wrist Flexion Stretch  - 4 x daily - 3-5 reps - 15 sec hold - Wrist Prayer Stretch  - 4 x daily - 3-5 reps - 15 sec hold - Seated Wrist Ulnar Deviation Stretch  - 3-5 x daily - 3-5 reps - 15 sec hold - BACK KNUCKLE STRETCHES   - 4 x daily - 3-5 reps - 15 sec hold - Thumb stretch  - 4 x daily - 3-5 reps - 15-20 sec hold - Ulnar Nerve Flossing  - 3-4 x daily - 1-2 sets - 5-10  reps  PATIENT EDUCATION: Education details: See tx section above for details  Person educated: Patient Education method: Verbal Instruction, Teach back, Handouts  Education comprehension: States and demonstrates understanding, Additional Education required    HOME EXERCISE PROGRAM: Access Code: [email or web address] URL: [email or web address] Date: 09/11/2022 Prepared by: Benito Mccreedy   GOALS: Goals reviewed with patient? Yes   SHORT TERM GOALS: (STG required if POC>30 days) Target Date: 09/14/22  Pt will obtain protective, custom orthotic. Goal status: 09/11/22 MET 2.  Pt will demo/state understanding of initial HEP to improve pain levels and prerequisite motion. Goal status: Progressing   LONG TERM GOALS: Target Date: 11/09/22  Pt will improve functional ability by decreased impairment per PRWE assessment from 88.5/100 to 35/100 or better, for better quality of life. Goal status: INITIAL  2.  Pt will improve grip strength in Rt hand to at least non-painful 30lbs for functional use at home and in IADLs. Goal status: INITIAL  3.  Pt will improve A/ROM in Rt SF TAM from 90* to at least 180*, to improve functional motion for tasks like reach and grasp.  Goal status: INITIAL  4.  Pt will improve strength in Rt wrist extension from painful 3-/5 MMT to at least 4/5 MMT to have increased functional ability to carry out selfcare and higher-level homecare tasks with no difficulty. Goal status: INITIAL  5.  Pt will improve coordination skills in Rt arm, as seen by [facility] score on Box and Blocks testing to have increased functional ability to carry out fine motor tasks (fasteners, etc.) and more complex, coordinated IADLs (meal prep, sports, etc.).  Goal status: INITIAL  6.  Pt will decrease pain at rest from 8/10 to 3/10 or better to have better sleep and occupational participation in daily roles. Goal status: INITIAL   ASSESSMENT:  CLINICAL IMPRESSION: 10/18/22: ***  09/27/22:  She now has  a full home exercise program from shoulder to hand, a good fitting orthotic to help her rest, and the education to prevent pain and hopefully decrease symptoms and increase functional ability.  Eventually we will need to upgrade to strengthening portions when her pain is consistently lower and her mobility is better.    PLAN:  OT FREQUENCY: 2x/week  OT DURATION: 10 weeks (through 11/09/22 as needed)   PLANNED INTERVENTIONS: self care/ADL training, therapeutic exercise, therapeutic activity, neuromuscular re-education, manual therapy, passive range of motion, splinting, electrical stimulation, ultrasound, fluidotherapy, compression bandaging, moist heat, cryotherapy, contrast bath, patient/family education, and coping strategies training  RECOMMENDED OTHER SERVICES: She is also seeing PT for LE pain  CONSULTED AND AGREED WITH PLAN OF CARE: Patient  PLAN FOR NEXT SESSION:  ***  Benito Mccreedy, OTR/L, CHT 10/08/2022, 5:38 PM

## 2022-10-18 ENCOUNTER — Encounter: Payer: Medicare HMO | Admitting: Rehabilitative and Restorative Service Providers"

## 2022-10-18 ENCOUNTER — Telehealth: Payer: Self-pay | Admitting: Rehabilitative and Restorative Service Providers"

## 2022-10-18 NOTE — Telephone Encounter (Signed)
Pt was called about cx appointments and not being seen by OT or PT for almost a month.  OT left a message to check on her status and asked her to call in for D/C request if she will continue to not be able to attend therapy, otherwise she was reminded of appointments next week. In any case, if she can't show up next week, OT will consider discharge for inability to attend schedule visits.

## 2022-10-19 ENCOUNTER — Encounter: Payer: Self-pay | Admitting: Family Medicine

## 2022-10-22 ENCOUNTER — Other Ambulatory Visit (HOSPITAL_COMMUNITY)
Admission: RE | Admit: 2022-10-22 | Discharge: 2022-10-22 | Disposition: A | Discharge status: Home / Self Care | Payer: Medicare HMO | Source: Ambulatory Visit | Attending: Family Medicine | Admitting: Family Medicine

## 2022-10-22 ENCOUNTER — Encounter: Payer: Self-pay | Admitting: Student

## 2022-10-22 ENCOUNTER — Ambulatory Visit (INDEPENDENT_AMBULATORY_CARE_PROVIDER_SITE_OTHER): Payer: Medicare HMO | Admitting: Student

## 2022-10-22 VITALS — BP 118/70 | HR 87 | Ht 61.0 in | Wt 297.0 lb

## 2022-10-22 DIAGNOSIS — E1165 Type 2 diabetes mellitus with hyperglycemia: Secondary | ICD-10-CM | POA: Diagnosis not present

## 2022-10-22 DIAGNOSIS — R052 Subacute cough: Secondary | ICD-10-CM | POA: Diagnosis not present

## 2022-10-22 DIAGNOSIS — R69 Illness, unspecified: Secondary | ICD-10-CM | POA: Diagnosis not present

## 2022-10-22 DIAGNOSIS — Z124 Encounter for screening for malignant neoplasm of cervix: Secondary | ICD-10-CM | POA: Insufficient documentation

## 2022-10-22 DIAGNOSIS — Z01419 Encounter for gynecological examination (general) (routine) without abnormal findings: Secondary | ICD-10-CM | POA: Diagnosis not present

## 2022-10-22 DIAGNOSIS — Z1151 Encounter for screening for human papillomavirus (HPV): Secondary | ICD-10-CM | POA: Insufficient documentation

## 2022-10-22 DIAGNOSIS — R319 Hematuria, unspecified: Secondary | ICD-10-CM

## 2022-10-22 LAB — POCT URINALYSIS DIP (MANUAL ENTRY)
Blood, UA: NEGATIVE
Glucose, UA: NEGATIVE mg/dL
Leukocytes, UA: NEGATIVE
Nitrite, UA: NEGATIVE
Protein Ur, POC: 30 mg/dL — AB
Spec Grav, UA: >=1.03 — AB (ref 1.010–1.025)
Urobilinogen, UA: 1 E.U./dL
pH, UA: 5.5 (ref 5.0–8.0)

## 2022-10-22 LAB — POCT GLYCOSYLATED HEMOGLOBIN (HGB A1C): HbA1c, POC (controlled diabetic range): 6.4 % (ref 0.0–7.0)

## 2022-10-22 MED ORDER — BENZONATATE 100 MG PO CAPS: 100.0000 mg | ORAL_CAPSULE | Freq: Two times a day (BID) | ORAL | 0 refills | Status: DC | PRN

## 2022-10-22 MED ORDER — ANORO ELLIPTA 62.5-25 MCG/ACT IN AEPB: 1.0000 | INHALATION_SPRAY | Freq: Every day | RESPIRATORY_TRACT | 2 refills | Status: DC

## 2022-10-22 NOTE — Therapy (Incomplete)
OUTPATIENT OCCUPATIONAL THERAPY TREATMENT NOTE  Patient Name: Amanda Larson MRN: [id number] DOB:1965-12-07, 57 y.o., female Today's Date: 10/22/2022  PCP: Erskine Emery, MD  REFERRING PROVIDER:  Gregor Hams, MD    END OF SESSION:    Past Medical History:  Diagnosis Date   Bronchitis    Chronic back pain    Depression    Diabetes mellitus without complication ([place])    Osteoarthritis    Past Surgical History:  Procedure Laterality Date   CESAREAN SECTION     CHOLECYSTECTOMY     TUBAL LIGATION     Patient Active Problem List   Diagnosis Date Noted   Cough 10/14/2022   Hematuria 10/14/2022   Acute right ankle pain 08/14/2022   Right wrist pain 07/24/2022   Pain of toe of right foot 04/27/2022   Type 2 diabetes mellitus with neurological complications ([place]) [id number]   Bipolar I disorder ([place]) 03/30/2020   Recurrent major depressive disorder, in partial remission ([place]) 03/30/2020   Generalized anxiety disorder 03/30/2020   Obstructive sleep apnea 11/21/2018   Osteoarthritis of left knee 05/15/2018   Gastro-esophageal reflux 05/15/2018   Hyperlipidemia 12/10/2017   Type 2 diabetes mellitus with hyperglycemia ([place]) 11/18/2017   Depression 02/08/2017   Chronic low back pain 02/08/2017   Asthma 02/08/2017   Psoriasis 02/08/2017   Healthcare maintenance 02/08/2017    ONSET DATE: ~6 months (June 2023)   REFERRING DIAG: M25.531 (ICD-10-CM) - Right wrist pain   THERAPY DIAG:  No diagnosis found.  Rationale for Evaluation and Treatment: Rehabilitation  PERTINENT HISTORY: Per MD Note: "Pt reports R wrist pain x 6 months. Pt is R-hand dominate. Pt locates pain to the radial aspect of the wrist, into 1st MC, into thumb, and along the 5th [facility]." She is also seeking PT tx for Rt ankle pain for 2-3 months.  Per OT eval: "states having a wrist support that hurts her because it feels to tight, so she is not wearing it and hasn't been wearing it. She states ulnar nerve  numbness in Rt hand at night at times, pain from upper/back neck to down shoulder into forearm and hand at times.  Thumb basal joint and wrist crease also painful and tender, but the worst pain in on the ulnar side of her wrist and hand which is also significantly swollen. "   PRECAUTIONS: Fall;  WEIGHT BEARING RESTRICTIONS: No   SUBJECTIVE:   SUBJECTIVE STATEMENT: She states ***   feeling better, less pain now and can touch Rt thumb to small finger tip!    PAIN:  Are you having pain? *** Yes in Rt wrist Rating: 4/10 at rest now, up to 9/10 at worst in past week in mornings     FALLS: Has patient fallen in last 6 months? Yes. Number of falls fell once in July at the pool onto L shoulder  PATIENT GOALS:  decrease pain in rt dominant hand/arm   OBJECTIVE: (All objective assessments below are from initial evaluation on: 08/31/22 unless otherwise specified.)   HAND DOMINANCE: Right   ADLs: Overall ADLs: States decreased ability to grab, hold household objects, pain and inability to open containers, perform FMS tasks (manipulate fasteners on clothing), mild to moderate bathing problems as well.    FUNCTIONAL OUTCOME MEASURES: Eval: Patient Rated Wrist Evaluation (PRWE): Pain: 46/50; Function: 42.5/50; Total Score: 88.5/100 (Higher Score  =  More Pain and/or Debility)    UPPER EXTREMITY ROM    At eval she she showed poor rounded  shoulder postures, noticeably limited shoulder range of motion as well as wrist and hand range of motion on the right side.  Not all details could be measured today due to severity and number of complaints.  Details to be determined next session.  Shoulder to Wrist AROM Right 09/11/22 Rt TBD  Shoulder flexion 118   Shoulder abduction 91   Shoulder extension 36   Shoulder internal rotation Pants line   Shoulder external rotation 70   Elbow flexion 146   Elbow extension 0   Forearm supination 38   Forearm pronation  85   Wrist flexion 35   Wrist  extension 30   Wrist ulnar deviation 15   Wrist radial deviation 21   (Blank rows = not tested)   Hand AROM Right eval  Full Fist Ability (or Gap to Distal Palmar Crease) No- ~1cm gap from digits 2-4, and 2cm gap digit 5  Thumb Opposition  (Kapandji Scale)  5  Thumb MCP (0-60) 0-45  Thumb IP (0-80) 0-37  Thumb Palmar Abduction Span 5.5 cm span  Little MCP (0-90) 0 -  54  Little PIP (0-100) (-37) -  57  Little DIP (0-70)  (-6) - 22  (Blank rows = not tested)   UPPER EXTREMITY MMT:    This was not tested at evaluation due to acute and chronic pains and limited time available.  Details to be determined, but at the wrist and hand she was grossly 3-5 MMT due to apparent limited motion  MMT Right TBD  Shoulder flexion   Shoulder abduction   Shoulder adduction   Shoulder extension   Shoulder internal rotation   Shoulder external rotation   Middle trapezius   Lower trapezius   Elbow flexion   Elbow extension   Forearm supination   Forearm pronation   Wrist flexion   Wrist extension   Wrist ulnar deviation   Wrist radial deviation   (Blank rows = not tested)  HAND FUNCTION: Eval: Observed weakness in affected Rt hand.  Grip strength Right: TBD lbs, Left: TBD lbs   COORDINATION: 09/11/22: Box and Blocks Test: RIGHT 42 Blocks today (56 is WFL)   SENSATION: Eval:  Light touch intact today, though median nerve had 88m static 2-pt discrimination and ulnar tested 477m   She was positive Tinel test at elbow (for ulnar) and guyon's canal.   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  The fact that she  holds her cane in her right hand, applying pressure and leaning on the side is obviously exacerbating all of these issues.   TODAY'S TREATMENT:  10/25/22: ***Check orthotic as needed, review full home exercise program from shoulder to hand, take grip strength and other new range of motion measures to check status.  09/27/22: She did not bring orthotic to be checked, but states that it fits perfectly and does not need adjusted.  She was still told to wear this to help her arm rest from her tendinitis as needed for any heavy tasks.  Additionally OT reviews her initial shoulder stretches with her, then educates thoroughly on the remaining 8 exercises in her home exercise  program-including ulnar nerve glides to help reeducate likely compressed ulnar nerve (as bolded below).  She performs each of these back to OT in detail carefully as to not add additional pain.  She tolerates these well and is encouraged to do them at least 2 or 3 times a day as able nonpainfully.  Again she was reminded to use heat or ice modalities as needed as well.    Exercises - Seated Scapular Retraction  - 4 x daily - 5-10 reps - Seated Shoulder Flexion Towel Slide at Table Top  - 4 x daily - 3-5 reps - 15 hold - Seated Shoulder External Rotation PROM on Table  - 3-4 x daily - 3-5 reps - 15 sec hold - Standing Shoulder Internal Rotation AAROM with Dowel  - 4-6 x daily - 1 sets - 10-15 reps - Standing Shoulder Extension ROM with Dowel  - 4 x daily - 10-15 reps - Forearm Supination Stretch  - 3-4 x daily - 3-5 reps - 15 sec hold - Bend and Pull Back Wrist SLOWLY  - 4 x daily - 10-15 reps - Wrist Flexion Stretch  - 4 x daily - 3-5 reps - 15 sec hold - Wrist Prayer Stretch  - 4 x daily - 3-5 reps - 15 sec hold - Seated Wrist Ulnar Deviation Stretch  - 3-5 x daily - 3-5 reps - 15 sec hold - BACK KNUCKLE STRETCHES   - 4 x daily - 3-5 reps - 15 sec hold - Thumb stretch  - 4 x daily - 3-5 reps - 15-20 sec hold - Ulnar Nerve Flossing   - 3-4 x daily - 1-2 sets - 5-10 reps  PATIENT EDUCATION: Education details: See tx section above for details  Person educated: Patient Education method: Verbal Instruction, Teach back, Handouts  Education comprehension: States and demonstrates understanding, Additional Education required    HOME EXERCISE PROGRAM: Access Code: [email or web address] URL: [email or web address] Date: 09/11/2022 Prepared by: Benito Mccreedy   GOALS: Goals reviewed with patient? Yes   SHORT TERM GOALS: (STG required if POC>30 days) Target Date: 09/14/22  Pt will obtain protective, custom orthotic. Goal status: 09/11/22 MET 2.  Pt will demo/state understanding of initial HEP to improve pain levels and prerequisite motion. Goal status: Progressing   LONG TERM GOALS: Target Date: 11/09/22  Pt will improve functional ability by decreased impairment per PRWE assessment from 88.5/100 to 35/100 or better, for better quality of life. Goal status: INITIAL  2.  Pt will improve grip strength in Rt hand to at least non-painful 30lbs for functional use at home and in IADLs. Goal status: INITIAL  3.  Pt will improve A/ROM in Rt SF TAM from 90* to at least 180*, to improve functional motion for tasks like reach and grasp.  Goal status: INITIAL  4.  Pt will improve strength in Rt wrist extension from painful 3-/5 MMT to at least 4/5 MMT to have increased functional ability to carry out selfcare and higher-level homecare tasks with no difficulty. Goal status: INITIAL  5.  Pt will improve coordination skills in Rt arm, as seen by [facility] score on Box and Blocks testing to have increased functional ability to carry out fine motor tasks (fasteners, etc.) and more complex, coordinated IADLs (meal prep, sports, etc.).  Goal status: INITIAL  6.  Pt will decrease pain at rest from 8/10 to 3/10 or better to have better sleep and occupational participation in daily roles. Goal status: INITIAL   ASSESSMENT:  CLINICAL  IMPRESSION: 10/25/22: ***  09/27/22: She now has a full home exercise program from shoulder to hand, a good fitting orthotic to help her rest, and the education to prevent pain and hopefully decrease symptoms and increase functional ability.  Eventually we will need to upgrade to strengthening portions when her pain is consistently lower and her mobility is better.    PLAN:  OT FREQUENCY: 2x/week  OT DURATION: 10 weeks (through 11/09/22 as needed)   PLANNED INTERVENTIONS: self care/ADL training, therapeutic exercise, therapeutic activity, neuromuscular re-education, manual therapy, passive range of motion, splinting, electrical stimulation, ultrasound, fluidotherapy, compression bandaging, moist heat, cryotherapy, contrast bath, patient/family education, and coping strategies training  RECOMMENDED OTHER SERVICES: She is also seeing PT for LE pain  CONSULTED AND AGREED WITH PLAN OF CARE: Patient  PLAN FOR NEXT SESSION:  ***  Benito Mccreedy, OTR/L, CHT 10/22/2022, 11:37 AM

## 2022-10-22 NOTE — Patient Instructions (Addendum)
It was great to see you today! Thank you for choosing [facility] for your primary care. Amanda Larson was seen for follow up.  Today we addressed: Continue with Anoro-Ellipta one puff a day in addition to albuterol every 6 hours  I have sent Tessalon pearls to take as needed for cough  I will update with pap results  If you notice the bleeding again, please let me know  Schedule pulmonary testing with Dr. Valentina Lucks up front   If you haven't already, sign up for My Chart to have easy access to your labs results, and communication with your primary care physician.  We are checking some labs today. If they are abnormal, I will call you. If they are normal, I will send you a MyChart message (if it is active) or a letter in the mail. If you do not hear about your labs in the next 2 weeks, please call the office. I recommend that you always bring your medications to each appointment as this makes it easy to ensure you are on the correct medications and helps [place] not miss refills when you need them. Call the clinic at [phone] if your symptoms worsen or you have any concerns.  You should return to our clinic Return in about 3 months (around 01/20/2023). Please arrive 15 minutes before your appointment to ensure smooth check in process.  We appreciate your efforts in making this happen.  Thank you for allowing me to participate in your care, Erskine Emery, MD 10/22/2022, 2:23 PM PGY-2, [place]

## 2022-10-22 NOTE — Progress Notes (Unsigned)
  SUBJECTIVE:   CHIEF COMPLAINT / HPI:   Concern for hematuria on last visit:  Unsure if the bleeding from last visit was from her vaginal area or from urine.  Patient reports today***.  Type {Blank single:19197::"1","2"} Diabetes: Home medications include: Metformin 500 mg 4 tabs daily, pregabalin 100 mg BID, Ozempic 1 mg once weekly. {Blank single:19197::"Does","Does not"} endorse compliance. Home glucose monitoring {home testing:315145}.   Most recent A1Cs:  Lab Results  Component Value Date   HGBA1C 6.4 10/22/2022   HGBA1C 6.7 07/23/2022   Last Microalbumin, LDL, Creatinine: Lab Results  Component Value Date   MICROALBUR 30 04/13/2019   LDLCALC 57 04/27/2022   CREATININE 0.67 04/27/2022    Patient {rwisisnot:24883} up to date on diabetic eye. Patient {rwisisnot:24883} up to date on diabetic foot exam.    PERTINENT  PMH / PSH: ***  Past Medical History:  Diagnosis Date   Bronchitis    Chronic back pain    Depression    Diabetes mellitus without complication (HCC)    Osteoarthritis     OBJECTIVE:  BP 118/70   Pulse 87   Ht 5\' 1"  (1.549 m)   Wt 297 lb (134.7 kg)   LMP 01/17/2014   SpO2 98%   BMI 56.12 kg/m  Physical Exam   ASSESSMENT/PLAN:  Hematuria, unspecified type -     POCT urinalysis dipstick  Type 2 diabetes mellitus with hyperglycemia, without long-term current use of insulin (HCC) -     POCT glycosylated hemoglobin (Hb A1C)  Encounter for Papanicolaou smear for cervical cancer screening -     Cytology - PAP   No follow-ups on file. Erskine Emery, MD 10/22/2022, 2:11 PM PGY-2, [place] {    This will disappear when note is signed, click to select method of visit    :1}

## 2022-10-23 LAB — CBC WITH DIFFERENTIAL/PLATELET
Basophils Absolute: 0.1 10*3/uL (ref 0.0–0.2)
Basos: 1 %
EOS (ABSOLUTE): 0.1 10*3/uL (ref 0.0–0.4)
Eos: 2 %
Hematocrit: 41.1 % (ref 34.0–46.6)
Hemoglobin: 13.1 g/dL (ref 11.1–15.9)
Immature Grans (Abs): 0.1 10*3/uL (ref 0.0–0.1)
Immature Granulocytes: 1 %
Lymphocytes Absolute: 2.8 10*3/uL (ref 0.7–3.1)
Lymphs: 36 %
MCH: 25.9 pg — ABNORMAL LOW (ref 26.6–33.0)
MCHC: 31.9 g/dL (ref 31.5–35.7)
MCV: 81 fL (ref 79–97)
Monocytes Absolute: 0.6 10*3/uL (ref 0.1–0.9)
Monocytes: 8 %
Neutrophils Absolute: 4.1 10*3/uL (ref 1.4–7.0)
Neutrophils: 52 %
Platelets: 256 10*3/uL (ref 150–450)
RBC: 5.05 x10E6/uL (ref 3.77–5.28)
RDW: 14.2 % (ref 11.7–15.4)
WBC: 7.9 10*3/uL (ref 3.4–10.8)

## 2022-10-23 NOTE — Assessment & Plan Note (Signed)
Unsure origin of bleeding, only three episodes in total. Colonoscopy UTD. Ex smoker, no current concern for malignancy. Possibly related to passing a kidney stone. UA w/o RBCs today. Pap performed today, NILM 04/2017. Instructed patient to call me if symptoms occur again. CBC ordered to check Hgb.

## 2022-10-23 NOTE — Assessment & Plan Note (Signed)
Stable on Ozempic and Metformin. UTD A1C ordered.

## 2022-10-23 NOTE — Assessment & Plan Note (Addendum)
Symptomatically would meet GOLD B criteria (low to medium impact on life with 1 exacerbation no hospitalizations), Discussed adding LAMA + LABA to SABA (ordered Anoro Ellipta). In addition to tessalon pearls and needs PFTs with pharm, instructed patient to set up appt with Dr. Valentina Lucks for this. Likely still exacerbation from viral URI.

## 2022-10-24 ENCOUNTER — Encounter: Payer: Self-pay | Admitting: Family Medicine

## 2022-10-24 NOTE — Therapy (Deleted)
OUTPATIENT PHYSICAL THERAPY TREATMENT NOTE   Patient Name: KITIARA MUDRICK MRN: [id number] DOB:[birth year], 57 y.o., female Today's Date: 10/24/2022  END OF SESSION:     Past Medical History:  Diagnosis Date   Bronchitis    Chronic back pain    Depression    Diabetes mellitus without complication ([place])    Osteoarthritis    Past Surgical History:  Procedure Laterality Date   CESAREAN SECTION     CHOLECYSTECTOMY     TUBAL LIGATION     Patient Active Problem List   Diagnosis Date Noted   Cough 10/14/2022   Right wrist pain 07/24/2022   Type 2 diabetes mellitus with neurological complications ([place]) [id number]   Bipolar I disorder ([place]) 03/30/2020   Recurrent major depressive disorder, in partial remission ([place]) 03/30/2020   Generalized anxiety disorder 03/30/2020   Obstructive sleep apnea 11/21/2018   Osteoarthritis of left knee 05/15/2018   Gastro-esophageal reflux 05/15/2018   Hyperlipidemia 12/10/2017   Type 2 diabetes mellitus with hyperglycemia ([place]) 11/18/2017   Depression 02/08/2017   Chronic low back pain 02/08/2017   Asthma 02/08/2017   Psoriasis 02/08/2017   Healthcare maintenance 02/08/2017     THERAPY DIAG:  No diagnosis found.    Chronic low back pain 02/08/2017   Asthma 02/08/2017   Psoriasis 02/08/2017   Healthcare maintenance 02/08/2017      PCP: Erskine Emery, MD   REFERRING PROVIDER: Gregor Hams, MD   REFERRING DIAG:  Diagnosis  M25.571,G89.29 (ICD-10-CM) - Chronic pain of right ankle      THERAPY DIAG:  Difficulty in walking, not elsewhere classified - Plan: PT plan of care cert/re-cert   Muscle weakness (generalized) - Plan: PT plan of care cert/re-cert   Stiffness of right ankle, not elsewhere classified - Plan: PT plan of care cert/re-cert   Pain in right ankle and joints of right foot - Plan: PT plan of care cert/re-cert   Rationale for Evaluation and Treatment: Rehabilitation   ONSET DATE: Chronic   SUBJECTIVE:     SUBJECTIVE STATEMENT: Estellene reports great compliance with her home exercises.  She notes less pain and needing the cane less often.   She would like to be able to stand and walk with better endurance and less pain to complete normal activities around her house. Prolonged standing bothers Lt knee and Rt ankle.  Uses a "buggy" at [place] or the grocery store to avoid standing for too long.   PERTINENT HISTORY: Bronchitis, chronic back pain, depression, diabetes, osteoarthritis PAIN:  Are you having pain? Yes: NPRS scale: 0-6/10 right ankle, 0/10 bilateral knees, 0-7 Lt hip. Pain location: Right ankle, medial ankle Pain description: Ache, sore and can be sharp Aggravating factors: Prolonged standing and walking Relieving factors: Rest   PRECAUTIONS: None   WEIGHT BEARING RESTRICTIONS: No   FALLS:  Has patient fallen in last 6 months? No   LIVING ENVIRONMENT: Lives with: lives with their family Lives in: House/apartment Stairs:  Avoids stairs Has following equipment at home: Single point cane   OCCUPATION: Disabled, tries to take care of the house, has trouble with certain chores due to wrist   PLOF: Independent with household mobility with device   PATIENT GOALS: Be able to be more independent with housework and have better standing and walking endurance with less right ankle pain   NEXT MD VISIT: ?   OBJECTIVE:    DIAGNOSTIC FINDINGS: IMPRESSION: 1. Mild tibiotalar osteoarthritis. 2. Normal variant os trigonum with associated osteoarthritis at the synchondrosis.  3. Large plantar calcaneal heel spur.   PATIENT SURVEYS:  FOTO Next visit (late start at evaluation)   COGNITION: Overall cognitive status: Within functional limits for tasks assessed                         SENSATION: Mikaelah did not note any peripheral tingling or paresthesias     LOWER EXTREMITY ROM:   Active ROM Right 08/31/2022 Left 08/31/2022 09/27/2022  Hip flexion       Hip extension        Hip abduction       Hip adduction       Hip internal rotation       Hip external rotation       Knee flexion       Knee extension       Ankle dorsiflexion -8 -8 0/6  Ankle plantarflexion       Ankle inversion       Ankle eversion        (Blank rows = not tested)   LOWER EXTREMITY MMT:   MMT Left/Right in pounds 08/31/2022  Left/Right in pounds 09/27/2022  Hip flexion      Hip extension      Hip abduction      Hip adduction      Hip internal rotation      Hip external rotation      Knee flexion      Knee extension      Ankle dorsiflexion      Ankle plantarflexion      Ankle inversion 10.4/7.9  38.3/30.8   Ankle eversion 15.1/13.5  32.1/19.4   (Blank rows = not tested)   GAIT: Distance walked: Within the clinic Assistive device utilized: Single point cane Level of assistance: Complete Independence Comments: Margreta Journey was advised to use the cane in her left hand to help calm down right hand tendinitis and to more adequately unweight her right ankle     TODAY'S TREATMENT:                                                                       DATE:  09/27/2021 Quadriceps sets 2 sets of 10 for 5 seconds with Bil ankle dorsiflexion Bridging (limited-range) 10X 3 seconds Heel to toe raises seated 15X 3 seconds Seated knee flexion AAROM (right pushes left into flexion) 10X 5 seconds Seated hip flexion stretch (left foot on 6 inch step, with good posture, lean your chest towards the left thigh) 10X 5 seconds  Neuromuscular re-education: Wide balance 3X 20 seconds  Functional Activities:  Sit to stand 2 sets of 5X slow eccentrics   09/12/2021 Quadriceps sets 2 sets of 10 for 5 seconds with Bil ankle dorsiflexion Bridging (limited-range) 10X 3 seconds Heel to toe raises seated 15X 3 seconds  Neuromuscular re-education: Wide balance 2 sets of 3X 20 seconds  Functional Activities:  Sit to stand 2 sets of 5X slow eccentrics   08/31/2022 Quadriceps sets with pillow  under both knees, encouraged bilateral ankle dorsiflexion 2 sets of 10 for 5 seconds (50+ per day recommended)     PATIENT EDUCATION:  Education details: We discussed keeping the cane in the left hand to unweight her  right ankle and also help reduce overuse of the right hand Person educated: Patient Education method: Explanation, Demonstration, Tactile cues, Verbal cues, and Handouts Education comprehension: verbalized understanding, returned demonstration, verbal cues required, tactile cues required, and needs further education   HOME EXERCISE PROGRAM: Access Code: [email or web address] URL: [email or web address] Date: 09/27/2022 Prepared by: Vista Mink  Exercises - Supine Quadricep Sets  - 3-5 x daily - 7 x weekly - 1-2 sets - 10 reps - 5 second hold - Bridge  - 2 x daily - 7 x weekly - 1 sets - 10 reps - 3 seconds hold - Tandem Stance  - 2 x daily - 7 x weekly - 1 sets - 3 reps - 20 second hold - Seated Heel Toe Raises  - 3-5 x daily - 7 x weekly - 1 sets - 10 reps - Sit to Stand with Armchair  - 2 x daily - 7 x weekly - 1 sets - 5 reps - Seated Knee Flexion AAROM  - 2 x daily - 7 x weekly - 1 sets - 10 reps - 5 seconds hold   ASSESSMENT:   CLINICAL IMPRESSION: Loranda reports great compliance with her home exercises.  With continued consistency, she is expected to meet her long-term goals.  She notes needing the cane less often, feeling stronger and having less pain over the past several days.   OBJECTIVE IMPAIRMENTS: Abnormal gait, cardiopulmonary status limiting activity, decreased activity tolerance, decreased coordination, decreased endurance, decreased knowledge of condition, decreased mobility, difficulty walking, decreased ROM, decreased strength, decreased safety awareness, increased edema, impaired perceived functional ability, impaired flexibility, obesity, and pain.   ACTIVITY LIMITATIONS: carrying, lifting, bending, standing, squatting, stairs, bed mobility, and  locomotion level   PARTICIPATION LIMITATIONS: meal prep, cleaning, laundry, shopping, and community activity   PERSONAL FACTORS: Bronchitis, chronic back pain, depression, diabetes, osteoarthritis are also affecting patient's functional outcome.    REHAB POTENTIAL: Good   CLINICAL DECISION MAKING: Stable/uncomplicated   EVALUATION COMPLEXITY: Low     GOALS: Goals reviewed with patient? Yes   SHORT TERM GOALS: Target date: 09/28/2022 Improve bilateral heel cords flexibility to 0 degrees Baseline: 8 degrees of plantarflexion bilaterally Goal status: Met 09/27/2022   2.  Improve bilateral ankle strength to at least 20 pounds inversion and eversion Baseline: 7 to 15 pounds Goal status: On Going 09/27/2022   3.  Rhapsody will be comfortable with using her cane in her left hand to unweight her right ankle and allow her right hand to break to help calm tendonitis Baseline: Using cane in the right hand Goal status: On Going 09/27/2022     LONG TERM GOALS: Target date: 11/24/2022   Improve FOTO to long-term goal Baseline: To be established next visit as she started late today after her OT appointment Goal status: INITIAL   2.  Improve right ankle pain to consistently less than 4 out of 10 on the visual analog scale Baseline: Can be 9 out of 10 Goal status: On Going 09/27/2022   3.  Improve bilateral ankle dorsiflexion active range of motion to 10 degrees Baseline: -8 degrees Goal status: On Going 09/27/2022   4.  Improve bilateral ankle strength for inversion and eversion to at least 40 pounds Baseline: 15 pounds or less Goal status: On Going 09/27/2022   5.  Karalyne will report improved standing and walking endurance Baseline: Poor standing and walking endurance at evaluation Goal status: Met 09/27/2022   6.  Elika will be independent with a long-term home  exercise program at discharge Baseline: Started 08/31/2022 Goal status: On Going 09/27/2022     PLAN:   PT FREQUENCY:  1x/week   PT DURATION: 12 weeks   PLANNED INTERVENTIONS: Therapeutic exercises, Therapeutic activity, Neuromuscular re-education, Balance training, Gait training, Patient/Family education, Self Care, Stair training, and Cryotherapy   PLAN FOR NEXT SESSION: Review HEP, check progress with getting socks and shoes on (hip and knee flexion), consider progressions to balance training for ankle, knee and hip strengthening, consider general lower extremity strengthening progressions with emphasis on the ankle and quadriceps.  Get a FOTO!!!      Lynden Ang, PT, MPT 10/24/2022, 12:57 PM

## 2022-10-25 ENCOUNTER — Encounter: Payer: Medicare HMO | Admitting: Rehabilitative and Restorative Service Providers"

## 2022-10-25 ENCOUNTER — Other Ambulatory Visit: Payer: Self-pay | Admitting: Student

## 2022-10-25 ENCOUNTER — Encounter: Payer: Medicare HMO | Admitting: Physical Therapy

## 2022-10-25 DIAGNOSIS — G8929 Other chronic pain: Secondary | ICD-10-CM

## 2022-10-25 LAB — CYTOLOGY - PAP
Adequacy: ABSENT
Comment: NEGATIVE
Diagnosis: NEGATIVE
High risk HPV: NEGATIVE

## 2022-11-04 ENCOUNTER — Other Ambulatory Visit: Payer: Self-pay | Admitting: Student

## 2022-11-06 ENCOUNTER — Telehealth: Payer: Self-pay

## 2022-11-06 NOTE — Telephone Encounter (Signed)
Patient calls nurse line in regards to [place].   She reports the medication needs a PA. I called the pharmacy and confirmed this. They report her insurance prefers Trulicity now.  This is not a new medication for her. She denies changes insurance and she does have a diagnoses of diabetes.   Will forward to PCP.

## 2022-11-07 ENCOUNTER — Telehealth: Payer: Self-pay

## 2022-11-07 NOTE — Telephone Encounter (Signed)
Informed patient her novo nordisk shipment is ready for pickup.   Two '1mg'$  dose pens and two '2mg'$  dose pens are labeled and ready in med room fridge.

## 2022-11-08 ENCOUNTER — Encounter: Payer: Self-pay | Admitting: Pharmacist

## 2022-11-08 ENCOUNTER — Encounter: Payer: Medicare HMO | Admitting: Rehabilitative and Restorative Service Providers"

## 2022-11-08 ENCOUNTER — Ambulatory Visit (INDEPENDENT_AMBULATORY_CARE_PROVIDER_SITE_OTHER): Payer: Medicare HMO | Admitting: Pharmacist

## 2022-11-08 VITALS — Ht 61.8 in | Wt 298.8 lb

## 2022-11-08 DIAGNOSIS — E1165 Type 2 diabetes mellitus with hyperglycemia: Secondary | ICD-10-CM

## 2022-11-08 DIAGNOSIS — J4520 Mild intermittent asthma, uncomplicated: Secondary | ICD-10-CM

## 2022-11-08 NOTE — Patient Instructions (Signed)
Apologies that we were not able to complete PFT today.   We will plan to call you when device is working.

## 2022-11-08 NOTE — Progress Notes (Signed)
   S:    Chief Complaint  Patient presents with   Medication Management    PFTs   Amanda Larson is a 57 y.o. female who presents for lung function evaluation.  PMH is significant for COPD, asthma, depression, and T2DM.  Patient was referred by Dr. Wendy Poet on 10/15/2022. Patient was last seen by Primary Care Provider, Dr. Zigmund Daniel, on 10/22/2022.   Patient reports breathing 9/10 today. Patient reports using her albuterol inhaler 1-2 times daily. Patient reports needing inhaler when active or running errands. Reports she was not able to afford Anoro Ellipta copay (>$40). Attempted PFT on patient today, however, experienced machine malfunction and will need to reschedule patient once machine is properly working.   Medication adherence reported Patient reports last dose of albuterol was yesterday Current COPD medications: albuterol PRN, unable to afford Anoro Ellipta  Rescue inhaler use frequency: 1-2x/daily  Patient reports seasonal asthma exacerbations mainly due to pollen.   O: Review of Systems  All other systems reviewed and are negative.   Physical Exam Constitutional:      Appearance: Normal appearance.  Pulmonary:     Effort: Pulmonary effort is normal.     Breath sounds: Normal breath sounds.  Neurological:     Mental Status: She is alert.  Psychiatric:        Mood and Affect: Mood normal.        Behavior: Behavior normal.        Thought Content: Thought content normal.        Judgment: Judgment normal.     mMRC score= >2 CAT score= 16 See Documentation Flowsheet - CAT/COPD for complete symptom scoring.  A/P: Patient has been experiencing breathlessness  for >1 month. Attempted PFT on patient today, however, experienced machine malfunction and will need to reschedule patient once machine is properly working.  -Patient unable to afford Anoro Ellipta copay (>$40). Provided patient information on how to apply for Medicare Extra Help/LIS.   Diabetes longstanding,  currently controlled based on A1c. Patient receives Ozempic through PAP.  -Continued Ozempic 2 mg weekly. Provided 3 boxes of the 2 mg pens from [facility].  -Continue metformin 2000 mg once daily.   Written patient instructions provided.   Total time in face to face counseling 30 minutes.    Follow-up:  Pharmacist: will schedule follow up once PFT machine is working. Patient seen with Francena Hanly, PharmD PGY-1 Pharmacy Resident and Joseph Art, PharmD, PGY2 Pharmacy Resident.

## 2022-11-08 NOTE — Assessment & Plan Note (Signed)
Diabetes longstanding, currently controlled based on A1c. Patient receives Ozempic through PAP.  -Continued Ozempic 2 mg weekly. Provided 3 boxes of the 2 mg pens from [facility].  -Continue metformin 2000 mg once daily.

## 2022-11-08 NOTE — Assessment & Plan Note (Signed)
Patient has been experiencing breathlessness  for >1 month. Attempted PFT on patient today, however, experienced machine malfunction and will need to reschedule patient once machine is properly working.  -Patient unable to afford Anoro Ellipta copay (>$40). Provided patient information on how to apply for Medicare Extra Help/LIS.

## 2022-11-09 NOTE — Progress Notes (Signed)
Reviewed and agree with Dr Graylin Shiver plan.

## 2022-11-19 ENCOUNTER — Telehealth: Payer: Self-pay | Admitting: Pharmacist

## 2022-11-19 NOTE — Telephone Encounter (Signed)
-----   Message from Leavy Cella, [place] sent at 11/08/2022  6:59 PM EST ----- Regarding: Reschedule PFT

## 2022-11-19 NOTE — Telephone Encounter (Signed)
Spirometry issue on 11/08/2022 (leap day) had a functional error with spirometry device.    I called patient to attempt reschedule.  I left message requesting her to reschedule at her convenience - phone [phone] provided.

## 2022-11-20 NOTE — Telephone Encounter (Signed)
Reviewed and agree with Dr Koval's plan.   

## 2022-11-27 ENCOUNTER — Other Ambulatory Visit: Payer: Self-pay | Admitting: Student

## 2022-11-27 DIAGNOSIS — M545 Low back pain, unspecified: Secondary | ICD-10-CM

## 2022-11-29 ENCOUNTER — Other Ambulatory Visit: Payer: Self-pay

## 2022-11-29 DIAGNOSIS — E785 Hyperlipidemia, unspecified: Secondary | ICD-10-CM

## 2022-11-29 DIAGNOSIS — E119 Type 2 diabetes mellitus without complications: Secondary | ICD-10-CM

## 2022-11-29 NOTE — Telephone Encounter (Signed)
Has been using for chronic pain, will try to space to PRN from daily

## 2022-12-01 MED ORDER — SIMVASTATIN 40 MG PO TABS: 40.0000 mg | ORAL_TABLET | Freq: Every day | ORAL | 2 refills | Status: DC

## 2022-12-01 MED ORDER — METFORMIN HCL ER 500 MG PO TB24: ORAL_TABLET | ORAL | 2 refills | Status: DC

## 2022-12-03 ENCOUNTER — Telehealth: Payer: Self-pay

## 2022-12-03 ENCOUNTER — Other Ambulatory Visit: Payer: Self-pay | Admitting: Student

## 2022-12-03 DIAGNOSIS — M545 Low back pain, unspecified: Secondary | ICD-10-CM

## 2022-12-03 MED ORDER — TRAMADOL HCL 50 MG PO TABS: 50.0000 mg | ORAL_TABLET | Freq: Every day | ORAL | 0 refills | Status: DC | PRN

## 2022-12-03 NOTE — Telephone Encounter (Signed)
PRESCRIPTION  sent.

## 2022-12-03 NOTE — Telephone Encounter (Signed)
Patient calls nurse line reporting difficulty picking up Tramadol prescription.   I called the pharmacy and they report an supervising physician for PCP will have to send this medication in.   The prescription written by Zigmund Daniel has been cancelled.  Will forward to afternoon preceptor.

## 2022-12-04 DIAGNOSIS — E119 Type 2 diabetes mellitus without complications: Secondary | ICD-10-CM | POA: Diagnosis not present

## 2022-12-05 ENCOUNTER — Other Ambulatory Visit: Payer: Self-pay | Admitting: Student

## 2022-12-05 DIAGNOSIS — M545 Low back pain, unspecified: Secondary | ICD-10-CM

## 2022-12-05 DIAGNOSIS — J4520 Mild intermittent asthma, uncomplicated: Secondary | ICD-10-CM

## 2022-12-31 ENCOUNTER — Other Ambulatory Visit: Payer: Self-pay | Admitting: Family Medicine

## 2022-12-31 DIAGNOSIS — M545 Low back pain, unspecified: Secondary | ICD-10-CM

## 2023-01-02 ENCOUNTER — Other Ambulatory Visit: Payer: Self-pay | Admitting: Student

## 2023-01-02 DIAGNOSIS — J4520 Mild intermittent asthma, uncomplicated: Secondary | ICD-10-CM

## 2023-01-03 NOTE — Telephone Encounter (Signed)
Received call from patient regarding tramadol refill. She states that she was told by pharmacy that there was an issue with resident DEA. Called pharmacy, provided with complete DEA for resident.   They asked for dx code for pain being treated. Provided with diagnosis.   They will get prescription ready for patient. Called patient and provided with update.   Veronda Prude, RN

## 2023-01-04 ENCOUNTER — Other Ambulatory Visit (HOSPITAL_COMMUNITY): Payer: Self-pay

## 2023-01-09 ENCOUNTER — Other Ambulatory Visit: Payer: Self-pay | Admitting: Student

## 2023-01-09 DIAGNOSIS — M545 Low back pain, unspecified: Secondary | ICD-10-CM

## 2023-01-16 ENCOUNTER — Other Ambulatory Visit: Payer: Self-pay | Admitting: Student

## 2023-01-16 DIAGNOSIS — G8929 Other chronic pain: Secondary | ICD-10-CM

## 2023-01-23 ENCOUNTER — Other Ambulatory Visit: Payer: Self-pay | Admitting: Student

## 2023-01-23 DIAGNOSIS — J4520 Mild intermittent asthma, uncomplicated: Secondary | ICD-10-CM

## 2023-01-24 ENCOUNTER — Other Ambulatory Visit: Payer: Self-pay | Admitting: Student

## 2023-01-24 DIAGNOSIS — G8929 Other chronic pain: Secondary | ICD-10-CM

## 2023-01-28 ENCOUNTER — Telehealth: Payer: Self-pay

## 2023-01-28 ENCOUNTER — Other Ambulatory Visit: Payer: Self-pay | Admitting: Family Medicine

## 2023-01-28 ENCOUNTER — Other Ambulatory Visit (HOSPITAL_COMMUNITY): Payer: Self-pay

## 2023-01-28 DIAGNOSIS — M545 Low back pain, unspecified: Secondary | ICD-10-CM

## 2023-01-28 NOTE — Telephone Encounter (Signed)
A Prior Authorization was initiated for this patients PREGABALIN through CoverMyMeds.   Key: [id number]

## 2023-01-29 ENCOUNTER — Other Ambulatory Visit: Payer: Self-pay

## 2023-01-29 ENCOUNTER — Telehealth: Payer: Self-pay

## 2023-01-29 DIAGNOSIS — E785 Hyperlipidemia, unspecified: Secondary | ICD-10-CM

## 2023-01-29 DIAGNOSIS — E119 Type 2 diabetes mellitus without complications: Secondary | ICD-10-CM

## 2023-01-29 DIAGNOSIS — G8929 Other chronic pain: Secondary | ICD-10-CM

## 2023-01-29 MED ORDER — SIMVASTATIN 40 MG PO TABS: 40.0000 mg | ORAL_TABLET | Freq: Every day | ORAL | 2 refills | Status: DC

## 2023-01-29 MED ORDER — METFORMIN HCL ER 500 MG PO TB24: ORAL_TABLET | ORAL | 2 refills | Status: DC

## 2023-01-29 NOTE — Telephone Encounter (Signed)
Informed pt her novo nordisk shipment of ozempic pens are ready for pickup.  (Forgot to previously document 12/25/22).  4 boxes of ozempic 2mg  dose pens are labeled and ready in med room

## 2023-01-30 NOTE — Telephone Encounter (Signed)
Rec'd fax from [place].   Medication approved from 09/10/22-09/10/23

## 2023-01-30 NOTE — Telephone Encounter (Signed)
Received call from patient and insurance company regarding PA for pregabalin.    This was previously denied. Insurance company was asking that we resubmit PA. Submitted re-determination over the phone.   Criteria was met for approval, however, has to be sent to clinical pharmacy team for final approval. We will receive this within 7 days.   Veronda Prude, RN

## 2023-02-11 ENCOUNTER — Encounter: Payer: Self-pay | Admitting: Student

## 2023-02-11 ENCOUNTER — Ambulatory Visit (INDEPENDENT_AMBULATORY_CARE_PROVIDER_SITE_OTHER): Payer: Medicare HMO | Admitting: Student

## 2023-02-11 VITALS — BP 115/81 | HR 79 | Ht 61.0 in | Wt 286.1 lb

## 2023-02-11 DIAGNOSIS — E1165 Type 2 diabetes mellitus with hyperglycemia: Secondary | ICD-10-CM | POA: Diagnosis not present

## 2023-02-11 DIAGNOSIS — R052 Subacute cough: Secondary | ICD-10-CM

## 2023-02-11 DIAGNOSIS — M545 Low back pain, unspecified: Secondary | ICD-10-CM | POA: Diagnosis not present

## 2023-02-11 DIAGNOSIS — R0600 Dyspnea, unspecified: Secondary | ICD-10-CM

## 2023-02-11 DIAGNOSIS — G8929 Other chronic pain: Secondary | ICD-10-CM

## 2023-02-11 LAB — POCT GLYCOSYLATED HEMOGLOBIN (HGB A1C): HbA1c, POC (controlled diabetic range): 6.2 % (ref 0.0–7.0)

## 2023-02-11 MED ORDER — MELOXICAM 15 MG PO TABS: ORAL_TABLET | ORAL | 0 refills | Status: DC

## 2023-02-11 NOTE — Progress Notes (Signed)
  SUBJECTIVE:   CHIEF COMPLAINT / HPI:   Type 2 Diabetes: Home medications include: Metformin, Ozempic 2 mg. Does endorse compliance.   Most recent A1Cs:  Lab Results  Component Value Date   HGBA1C 6.2 02/11/2023   HGBA1C 6.4 10/22/2022   Last Microalbumin, LDL, Creatinine: Lab Results  Component Value Date   MICROALBUR 30 04/13/2019   LDLCALC 57 04/27/2022   CREATININE 0.67 04/27/2022    Med Reconciliation:  -Uses albuterol PRN, 2 episodes of difficulty breathing since the last appt with Dr. Marsh Dolly, has not been able to receive PFTs as of yet. History of smoking 1 PPD; has not smoked for 10-12 years now. Cannot walk long distances. Episodes consist of sputum production, green. No current sputum production or difficulty with breathing, fever or chills. Declined steroid inhaler in the past secondary to cost.  -Trazodone 50 mg  -Tramadol 50 mg daily  -Simvastatin  -Pregabalin  -Ozempic  -Omega three  -MVM -Metformin  -Mobic  -Hydroxyzine  -Tessalon     PERTINENT  PMH / PSH: OSA, GERD, DM2, depression, GAD, depression, bipolar dx? Chronic low back pain  Patient Care Team: Alfredo Martinez, MD as PCP - General (Family Medicine) OBJECTIVE:  BP 115/81   Pulse 79   Ht 5\' 1"  (1.549 m)   Wt 286 lb 2 oz (129.8 kg)   LMP 01/17/2014   SpO2 96%   BMI 54.06 kg/m  Physical Exam  General: Alert and oriented in no apparent distress Heart: Regular rate and rhythm with no murmurs appreciated Lungs: CTA bilaterally, no wheezing Abdomen: Bowel sounds present, no abdominal pain Skin: Warm and dry Extremities: No lower extremity edema   ASSESSMENT/PLAN:  Type 2 diabetes mellitus with hyperglycemia, without long-term current use of insulin (HCC) Assessment & Plan:  well controlled - Last A1c:  Lab Results  Component Value Date   HGBA1C 6.2 02/11/2023   - Medications: Ozempic and Metformin - Compliance: Great! - On statin  3 months   Orders: -     POCT glycosylated  hemoglobin (Hb A1C) -     Basic metabolic panel  Chronic bilateral low back pain without sciatica Assessment & Plan: Chronic, not good surgical candidate. Well controlled pain on Tramadol, Lyrica, and Mobic. Discussed side effects of Mobic, starting PPI or H2 blocker. Patient verbalized understanding.   Orders: -     Meloxicam; TAKE 1 TABLET BY MOUTH ONCE DAILY . APPOINTMENT REQUIRED FOR FUTURE REFILLS  Dispense: 30 tablet; Refill: 0  Subacute cough Assessment & Plan: Recurrent and possibly related to COPD, had a follow up with Koval, PFT machine was broken. Unable to afford steroid inhaler previously. Will run by camille. If still symptomatic, may benefit from CXR or echo on next visit.      Return in about 3 months (around 05/14/2023). Alfredo Martinez, MD 02/11/2023, 3:54 PM PGY-2, [facility]

## 2023-02-11 NOTE — Assessment & Plan Note (Signed)
Recurrent and possibly related to COPD, had a follow up with Koval, PFT machine was broken. Unable to afford steroid inhaler previously. Will run by camille. If still symptomatic, may benefit from CXR or echo on next visit.

## 2023-02-11 NOTE — Assessment & Plan Note (Signed)
Chronic, not good surgical candidate. Well controlled pain on Tramadol, Lyrica, and Mobic. Discussed side effects of Mobic, starting PPI or H2 blocker. Patient verbalized understanding.

## 2023-02-11 NOTE — Patient Instructions (Addendum)
It was great to see you today! Thank you for choosing [facility] for your primary care. Amanda Larson was seen for follow up.  Today we addressed: Continue with Meloxicam, maybe split in half on good days  I could start you on a PPI to take daily  Request refills as needed  I will update you about the inhaler   If you haven't already, sign up for My Chart to have easy access to your labs results, and communication with your primary care physician.  We are checking some labs today. If they are abnormal, I will call you. If they are normal, I will send you a MyChart message (if it is active) or a letter in the mail. If you do not hear about your labs in the next 2 weeks, please call the office. I recommend that you always bring your medications to each appointment as this makes it easy to ensure you are on the correct medications and helps [place] not miss refills when you need them. Call the clinic at [phone] if your symptoms worsen or you have any concerns.  You should return to our clinic Return in about 3 months (around 05/14/2023). Please arrive 15 minutes before your appointment to ensure smooth check in process.  We appreciate your efforts in making this happen.  Thank you for allowing me to participate in your care, Alfredo Martinez, MD 02/11/2023, 2:50 PM PGY-2, [facility]

## 2023-02-11 NOTE — Addendum Note (Signed)
Addended by: Alfredo Martinez on: 02/11/2023 04:43 PM   Modules accepted: Orders

## 2023-02-11 NOTE — Assessment & Plan Note (Signed)
  well controlled - Last A1c:  Lab Results  Component Value Date   HGBA1C 6.2 02/11/2023   - Medications: Ozempic and Metformin - Compliance: Great! - On statin  3 months

## 2023-02-12 LAB — BASIC METABOLIC PANEL
BUN/Creatinine Ratio: 19 (ref 9–23)
BUN: 14 mg/dL (ref 6–24)
CO2: 26 mmol/L (ref 20–29)
Calcium: 10 mg/dL (ref 8.7–10.2)
Chloride: 103 mmol/L (ref 96–106)
Creatinine, Ser: 0.73 mg/dL (ref 0.57–1.00)
Glucose: 98 mg/dL (ref 70–99)
Potassium: 4.5 mmol/L (ref 3.5–5.2)
Sodium: 143 mmol/L (ref 134–144)
eGFR: 96 mL/min/{1.73_m2} (ref >59)

## 2023-02-13 ENCOUNTER — Ambulatory Visit (INDEPENDENT_AMBULATORY_CARE_PROVIDER_SITE_OTHER): Payer: Medicare HMO | Admitting: Pharmacist

## 2023-02-13 ENCOUNTER — Other Ambulatory Visit (HOSPITAL_COMMUNITY): Payer: Self-pay

## 2023-02-13 ENCOUNTER — Encounter: Payer: Self-pay | Admitting: Pharmacist

## 2023-02-13 VITALS — BP 101/63 | HR 80 | Ht 61.5 in | Wt 287.4 lb

## 2023-02-13 DIAGNOSIS — J4520 Mild intermittent asthma, uncomplicated: Secondary | ICD-10-CM | POA: Diagnosis not present

## 2023-02-13 MED ORDER — BUDESONIDE-FORMOTEROL FUMARATE 80-4.5 MCG/ACT IN AERO
2.0000 | INHALATION_SPRAY | Freq: Two times a day (BID) | RESPIRATORY_TRACT | 6 refills | Status: DC
Start: 2023-02-13 — End: 2023-03-29

## 2023-02-13 NOTE — Progress Notes (Signed)
   S:     Chief Complaint  Patient presents with   Medication Management    PFTs   Amanda Larson is a 56 y.o. female who presents for lung function evaluation.  PMH is significant for asthma, bipolar disorder, HLD, GERD, depression, and T2DM.  Patient was referred and last seen by Primary Care Provider, Dr. Jena Gauss, on 02/11/23.   At last visit, on 11/08/2022 the spirometer had a malfunction resulting in reschedule of appointment.   Patient reports breathing has been worse for a period of time when she was taking albuterol up to four times daily.  She states this improved and she denies use of any albuterol inhaler in the past 3 days.    Patient reports adherence to medications Patient reports last dose of asthma medications was multiple days ago. Current asthma medications: albuterol inhaler  Rescue inhaler use frequency: PRN only 2-3 times per week on average BUT use varies greatly based on season/weather changes.   Level of asthma sx control- in the last 4 weeks: Question Scoring Patient Score  Daytime sx > 2x/week Yes (1)   No (0) 1  Any nighttime waking due to asthma Yes (1)   No (0) 1  Reliever needed >2x/week Yes (1)   No (0) 1  Any activity limitation due to asthma Yes (1)   No (0) 1   Total Score   Well controlled - 0, Partly controlled - 1-2, Uncontrolled 3-4   O: Review of Systems  Respiratory:  Positive for cough, sputum production and shortness of breath.   All other systems reviewed and are negative.   Physical Exam Vitals reviewed.  Pulmonary:     Effort: Pulmonary effort is normal.  Neurological:     Mental Status: She is alert.  Psychiatric:        Mood and Affect: Mood normal.        Behavior: Behavior normal.        Thought Content: Thought content normal.     Vitals:   02/13/23 1028  BP: 101/63  Pulse: 80  SpO2: 100%    See "scanned report" or Documentation Flowsheet (discrete results - PFTs) for  Spirometry results. Patient provided good  effort while attempting spirometry.   Lung Age = 56 Albuterol Neb  Lot# [id number]     Exp. 11/08/2023   A/P: Patient with history of Asthma since she was "in her 20's" experiencing cough with chest tightness and phlegm production for >1 year.  History of tobacco use approximated at 27 pack years of exposure - documented quit since 2008. Taking albuterol as needed for symptoms ~2-3 times a week. Medication adherence appears appropriate as use appears to provide symptom relief.  Spirometry evaluation reveals near normal lung function with ~ 10% improvement in FEV1/FVC post albuterol. FEF25/75 improved by 35%.  -Trial of Symbicort (budesone/formoterol) initiated to evaluate impact on cough and need for PRN rescue use.  Instructed to use on a schedule initially plus PRN.   -Educated patient on purpose, proper use, potential adverse effects including risk of esophageal candidiasis and need to rinse mouth after each use.  -Reviewed results of pulmonary function tests. Patient verbalized understanding of treatment plan.   Written patient instructions provided.  Total time in face to face counseling 47 minutes.    Follow-up:  Pharmacist PRN. PCP clinic visit 7/19 for reevaluation of cough.  Patient seen with Lily Peer, PharmD, PGY-1 resident.

## 2023-02-13 NOTE — Assessment & Plan Note (Signed)
Patient with history of Asthma since she was "in her 26's" experiencing cough with chest tightness and phlegm production for >1 year.  History of tobacco use approximated at 27 pack years of exposure - documented quit since 2008. Taking albuterol as needed for symptoms ~2-3 times a week. Medication adherence appears appropriate as use appears to provide symptom relief.  Spirometry evaluation reveals near normal lung function with ~ 10% improvement in FEV1/FVC post albuterol.  -Trial of Symbicort (budesone/formoterol) initiated to evaluate impact on cough and need for PRN rescue use.  Instructed to use on a schedule initially plus PRN.

## 2023-02-13 NOTE — Patient Instructions (Signed)
It was nice to see you today!  Medication Changes: Begin Symbicort 2 puffs into the lungs twice daily. Be sure to rinse your mouth out after using.

## 2023-02-14 ENCOUNTER — Other Ambulatory Visit (HOSPITAL_COMMUNITY): Payer: Self-pay | Admitting: Psychiatry

## 2023-02-14 ENCOUNTER — Other Ambulatory Visit: Payer: Self-pay | Admitting: Student

## 2023-02-14 DIAGNOSIS — F411 Generalized anxiety disorder: Secondary | ICD-10-CM

## 2023-02-14 DIAGNOSIS — F319 Bipolar disorder, unspecified: Secondary | ICD-10-CM

## 2023-02-14 DIAGNOSIS — G8929 Other chronic pain: Secondary | ICD-10-CM

## 2023-02-14 NOTE — Progress Notes (Signed)
Reviewed and agree with Dr Koval's plan.   

## 2023-02-18 ENCOUNTER — Encounter: Payer: Self-pay | Admitting: Student

## 2023-02-22 ENCOUNTER — Other Ambulatory Visit: Payer: Self-pay | Admitting: Student

## 2023-02-22 DIAGNOSIS — J4520 Mild intermittent asthma, uncomplicated: Secondary | ICD-10-CM

## 2023-02-27 ENCOUNTER — Ambulatory Visit (HOSPITAL_COMMUNITY)
Admission: RE | Admit: 2023-02-27 | Discharge: 2023-02-27 | Disposition: A | Discharge status: Home / Self Care | Payer: Medicare HMO | Source: Ambulatory Visit | Attending: Family Medicine | Admitting: Family Medicine

## 2023-02-27 ENCOUNTER — Other Ambulatory Visit: Payer: Self-pay | Admitting: Student

## 2023-02-27 ENCOUNTER — Other Ambulatory Visit: Payer: Self-pay | Admitting: Family Medicine

## 2023-02-27 DIAGNOSIS — M545 Low back pain, unspecified: Secondary | ICD-10-CM

## 2023-02-27 DIAGNOSIS — I517 Cardiomegaly: Secondary | ICD-10-CM | POA: Diagnosis not present

## 2023-02-27 DIAGNOSIS — G8929 Other chronic pain: Secondary | ICD-10-CM

## 2023-02-27 DIAGNOSIS — R0600 Dyspnea, unspecified: Secondary | ICD-10-CM | POA: Diagnosis not present

## 2023-02-27 DIAGNOSIS — E119 Type 2 diabetes mellitus without complications: Secondary | ICD-10-CM | POA: Insufficient documentation

## 2023-02-27 LAB — ECHOCARDIOGRAM COMPLETE
AR max vel: 1.45 cm2
AV Area VTI: 1.5 cm2
AV Area mean vel: 1.45 cm2
AV Mean grad: 2.5 mmHg
AV Peak grad: 4.9 mmHg
Ao pk vel: 1.11 m/s
Area-P 1/2: 2.99 cm2
Calc EF: 67.9 %
S' Lateral: 3.5 cm
Single Plane A2C EF: 62 %
Single Plane A4C EF: 73.7 %

## 2023-02-27 MED ORDER — PERFLUTREN LIPID MICROSPHERE
1.0000 mL | INTRAVENOUS | Status: AC | PRN
  Administered 2023-02-27: 4 mL via INTRAVENOUS

## 2023-02-28 MED ORDER — PREGABALIN 100 MG PO CAPS: 100.0000 mg | ORAL_CAPSULE | Freq: Two times a day (BID) | ORAL | 0 refills | Status: DC

## 2023-03-10 ENCOUNTER — Other Ambulatory Visit: Payer: Self-pay | Admitting: Student

## 2023-03-10 DIAGNOSIS — M545 Low back pain, unspecified: Secondary | ICD-10-CM

## 2023-03-17 ENCOUNTER — Other Ambulatory Visit (HOSPITAL_COMMUNITY): Payer: Self-pay | Admitting: Psychiatry

## 2023-03-17 DIAGNOSIS — F319 Bipolar disorder, unspecified: Secondary | ICD-10-CM

## 2023-03-20 ENCOUNTER — Other Ambulatory Visit (HOSPITAL_COMMUNITY): Payer: Self-pay | Admitting: Psychiatry

## 2023-03-20 DIAGNOSIS — F319 Bipolar disorder, unspecified: Secondary | ICD-10-CM

## 2023-03-20 DIAGNOSIS — F411 Generalized anxiety disorder: Secondary | ICD-10-CM

## 2023-03-21 ENCOUNTER — Other Ambulatory Visit (HOSPITAL_COMMUNITY): Payer: Self-pay | Admitting: Psychiatry

## 2023-03-21 DIAGNOSIS — F319 Bipolar disorder, unspecified: Secondary | ICD-10-CM

## 2023-03-21 DIAGNOSIS — F411 Generalized anxiety disorder: Secondary | ICD-10-CM

## 2023-03-23 ENCOUNTER — Other Ambulatory Visit: Payer: Self-pay | Admitting: Student

## 2023-03-23 DIAGNOSIS — J4520 Mild intermittent asthma, uncomplicated: Secondary | ICD-10-CM

## 2023-03-27 ENCOUNTER — Other Ambulatory Visit (HOSPITAL_COMMUNITY): Payer: Self-pay | Admitting: Psychiatry

## 2023-03-27 ENCOUNTER — Telehealth (HOSPITAL_COMMUNITY): Payer: Self-pay

## 2023-03-27 DIAGNOSIS — F411 Generalized anxiety disorder: Secondary | ICD-10-CM

## 2023-03-27 DIAGNOSIS — F319 Bipolar disorder, unspecified: Secondary | ICD-10-CM

## 2023-03-27 NOTE — Telephone Encounter (Signed)
Medication refills - Faxes from patient's [facility] requesting new Citalopram 40 mg and Trazodone 50 mg orders, last provided when pt last seen 08/20/22. Patient with no current appointment scheduled for follow up.

## 2023-03-28 ENCOUNTER — Other Ambulatory Visit: Payer: Self-pay

## 2023-03-28 DIAGNOSIS — F411 Generalized anxiety disorder: Secondary | ICD-10-CM

## 2023-03-28 DIAGNOSIS — F319 Bipolar disorder, unspecified: Secondary | ICD-10-CM

## 2023-03-28 NOTE — Telephone Encounter (Signed)
Patient needs an appointment prior to medication refills.

## 2023-03-29 ENCOUNTER — Telehealth: Payer: Self-pay

## 2023-03-29 ENCOUNTER — Encounter: Payer: Self-pay | Admitting: Student

## 2023-03-29 ENCOUNTER — Ambulatory Visit (INDEPENDENT_AMBULATORY_CARE_PROVIDER_SITE_OTHER): Payer: Medicare HMO | Admitting: Student

## 2023-03-29 VITALS — BP 113/88 | HR 101 | Ht 61.0 in | Wt 285.2 lb

## 2023-03-29 DIAGNOSIS — Z1231 Encounter for screening mammogram for malignant neoplasm of breast: Secondary | ICD-10-CM

## 2023-03-29 DIAGNOSIS — E1149 Type 2 diabetes mellitus with other diabetic neurological complication: Secondary | ICD-10-CM | POA: Diagnosis not present

## 2023-03-29 DIAGNOSIS — J4520 Mild intermittent asthma, uncomplicated: Secondary | ICD-10-CM | POA: Diagnosis not present

## 2023-03-29 DIAGNOSIS — F411 Generalized anxiety disorder: Secondary | ICD-10-CM

## 2023-03-29 DIAGNOSIS — F3341 Major depressive disorder, recurrent, in partial remission: Secondary | ICD-10-CM | POA: Diagnosis not present

## 2023-03-29 DIAGNOSIS — R931 Abnormal findings on diagnostic imaging of heart and coronary circulation: Secondary | ICD-10-CM

## 2023-03-29 DIAGNOSIS — F319 Bipolar disorder, unspecified: Secondary | ICD-10-CM

## 2023-03-29 MED ORDER — BUDESONIDE-FORMOTEROL FUMARATE 80-4.5 MCG/ACT IN AERO: 2.0000 | INHALATION_SPRAY | Freq: Two times a day (BID) | RESPIRATORY_TRACT | 6 refills | Status: DC

## 2023-03-29 MED ORDER — CITALOPRAM HYDROBROMIDE 40 MG PO TABS: ORAL_TABLET | Freq: Every day | ORAL | 3 refills | Status: DC

## 2023-03-29 MED ORDER — CITALOPRAM HYDROBROMIDE 40 MG PO TABS
ORAL_TABLET | Freq: Every day | ORAL | 3 refills | Status: DC
Start: 2023-03-29 — End: 2023-07-22

## 2023-03-29 MED ORDER — BUDESONIDE-FORMOTEROL FUMARATE 80-4.5 MCG/ACT IN AERO
2.0000 | INHALATION_SPRAY | Freq: Two times a day (BID) | RESPIRATORY_TRACT | 6 refills | Status: AC
Start: 2023-03-29 — End: ?

## 2023-03-29 MED ORDER — TRAZODONE HCL 50 MG PO TABS: ORAL_TABLET | Freq: Every day | ORAL | 3 refills | Status: DC

## 2023-03-29 MED ORDER — TRAZODONE HCL 50 MG PO TABS
ORAL_TABLET | Freq: Every day | ORAL | 3 refills | Status: DC
Start: 2023-03-29 — End: 2023-07-17

## 2023-03-29 NOTE — Assessment & Plan Note (Signed)
Doing well on Symbicort, refill printed

## 2023-03-29 NOTE — Patient Instructions (Addendum)
It was great to see you today! Thank you for choosing [facility] for your primary care. Amanda Larson was seen for follow up.  Today we addressed: You need a mammogram to prevent breast cancer.  Please schedule an appointment.  You can call [phone].     We have ordered the CTA for you to have performed -- we will call to schedule in 1 week  I have sent in your medication refills  You can call for a virtual visit, but I want to here from you in 1-2 weeks   If you haven't already, sign up for My Chart to have easy access to your labs results, and communication with your primary care physician.  I recommend that you always bring your medications to each appointment as this makes it easy to ensure you are on the correct medications and helps [place] not miss refills when you need them. Call the clinic at [phone] if your symptoms worsen or you have any concerns.  You should return to our clinic Return in about 2 weeks (around 04/12/2023) for Mood check up . Please arrive 15 minutes before your appointment to ensure smooth check in process.  We appreciate your efforts in making this happen.  Thank you for allowing me to participate in your care, Alfredo Martinez, MD 03/29/2023, 1:53 PM PGY-3, [facility]

## 2023-03-29 NOTE — Assessment & Plan Note (Signed)
Unsure how medications went unfilled, but refilled via printed prescription. Discussed that she inform me with any changes in mood. No SI or HI, seems to have slight withdrawal from medications causing her symptoms which is understandable. Call me in 2 weeks with mood check up.

## 2023-03-29 NOTE — Assessment & Plan Note (Signed)
See echo report, concern of artifact but recommended CTA on report, ordered and will call patient in one week to schedule.

## 2023-03-29 NOTE — Progress Notes (Signed)
SUBJECTIVE:   CHIEF COMPLAINT / HPI:   Type 2 Diabetes: Home medications include: Metformin, Ozempic 2 mg. Does endorse compliance.   Most recent A1Cs:  Lab Results  Component Value Date   HGBA1C 6.2 02/11/2023   HGBA1C 6.4 10/22/2022   Last Microalbumin, LDL, Creatinine: Lab Results  Component Value Date   MICROALBUR 30 04/13/2019   LDLCALC 57 04/27/2022   CREATININE 0.73 02/11/2023   Asthma:  Trial of symbicort after discussion with Dr. Raymondo Band   Depression:  Patient reports that she has been on Celexa 40 mg and Trazodone 50 mg at night for years for her depression.  Denies SI or HI She has been unable to get her medications refilled for 1 week, unsure of how this happened.  She has muscle aches and difficulty sleeping now since she has been off of her medications.  Reports that she was very stable on these medications before she ran out.      03/29/2023    1:28 PM 02/11/2023    2:22 PM 10/22/2022    1:49 PM  PHQ9 SCORE ONLY  PHQ-9 Total Score 24 7 6      PERTINENT  PMH / PSH: OSA, GERD, DM2, depression, GAD, depression, bipolar dx? Chronic low back pain  Patient Care Team: Alfredo Martinez, MD as PCP - General (Family Medicine) OBJECTIVE:  BP 113/88   Pulse (!) 101   Ht 5\' 1"  (1.549 m)   Wt 285 lb 4 oz (129.4 kg)   LMP 01/17/2014   SpO2 100%   BMI 53.90 kg/m  Physical Exam General: Alert and oriented, intermittently tearful but appropriate Heart: Regular rate and rhythm with no murmurs appreciated Lungs: normal WOB Skin: Warm and dry Psych: Tearful, broad affect   ASSESSMENT/PLAN:  Encounter for screening mammogram for malignant neoplasm of breast Assessment & Plan: Needs UTD MM, ordered   Orders: -     3D Screening Mammogram, Left and Right; Future  Abnormal echocardiogram findings without diagnosis Assessment & Plan: See echo report, concern of artifact but recommended CTA on report, ordered and will call patient in one week to schedule.    Orders: -     CT Angio Chest Pulmonary Embolism (PE) W or WO Contrast; Future  Intermittent asthma, unspecified asthma severity, unspecified whether complicated Assessment & Plan: Doing well on Symbicort, refill printed   Orders: -     Budesonide-Formoterol Fumarate; Inhale 2 puffs into the lungs 2 (two) times daily.  Dispense: 1 each; Refill: 6  Bipolar I disorder (HCC) -     traZODone HCl; TAKE 1 TABLET (50 MG TOTAL) BY MOUTH AT BEDTIME.  Dispense: 30 tablet; Refill: 3 -     Citalopram Hydrobromide; TAKE 1 TABLET (40 MG TOTAL) BY MOUTH DAILY.  Dispense: 30 tablet; Refill: 3  Generalized anxiety disorder -     Citalopram Hydrobromide; TAKE 1 TABLET (40 MG TOTAL) BY MOUTH DAILY.  Dispense: 30 tablet; Refill: 3  Type 2 diabetes mellitus with neurological complications (HCC) Assessment & Plan: Stable, continue current medications    Recurrent major depressive disorder, in partial remission [facility]) Assessment & Plan: Unsure how medications went unfilled, but refilled via printed prescription. Discussed that she inform me with any changes in mood. No SI or HI, seems to have slight withdrawal from medications causing her symptoms which is understandable. Call me in 2 weeks with mood check up.     Return in about 2 weeks (around 04/12/2023) for Mood check up . Alfredo Martinez, MD 03/29/2023, 2:44  PM PGY-3, [facility]

## 2023-03-29 NOTE — Assessment & Plan Note (Signed)
Needs UTD MM, ordered

## 2023-03-29 NOTE — Telephone Encounter (Signed)
Spoke with patient informed. Of Ct scan at [facility] 30th 1:30pm. Patient understood.  Aquilla Solian, CMA

## 2023-03-29 NOTE — Assessment & Plan Note (Signed)
Stable, continue current medications.  

## 2023-03-30 ENCOUNTER — Other Ambulatory Visit: Payer: Self-pay | Admitting: Student

## 2023-03-30 DIAGNOSIS — M545 Low back pain, unspecified: Secondary | ICD-10-CM

## 2023-04-02 ENCOUNTER — Telehealth: Payer: Self-pay

## 2023-04-02 NOTE — Telephone Encounter (Signed)
Patient calls nurse line regarding issues with tramadol prescription.   She reports that PA is needed. Called pharmacy and they verified that PA was needed.   Attempted to submit PA via covermymeds. Received the following message. "Patient has access to the requested medication and prior authorization is not needed."   Returned call to pharmacy and advised of this message. Pharmacy tech reached out to pharmacist who advised that PA is not actually needed and they can work on filling medication now.   Called patient and provided with update.   Veronda Prude, RN

## 2023-04-09 ENCOUNTER — Other Ambulatory Visit: Payer: Self-pay | Admitting: Student

## 2023-04-09 ENCOUNTER — Ambulatory Visit (HOSPITAL_COMMUNITY)
Admission: RE | Admit: 2023-04-09 | Discharge: 2023-04-09 | Disposition: A | Discharge status: Home / Self Care | Payer: Medicare HMO | Source: Ambulatory Visit | Attending: Family Medicine | Admitting: Family Medicine

## 2023-04-09 DIAGNOSIS — R931 Abnormal findings on diagnostic imaging of heart and coronary circulation: Secondary | ICD-10-CM

## 2023-04-09 DIAGNOSIS — I517 Cardiomegaly: Secondary | ICD-10-CM | POA: Diagnosis not present

## 2023-04-09 DIAGNOSIS — E1149 Type 2 diabetes mellitus with other diabetic neurological complication: Secondary | ICD-10-CM | POA: Diagnosis not present

## 2023-04-09 DIAGNOSIS — G8929 Other chronic pain: Secondary | ICD-10-CM

## 2023-04-09 MED ORDER — IOHEXOL 350 MG/ML SOLN
75.0000 mL | Freq: Once | INTRAVENOUS | Status: AC | PRN
  Administered 2023-04-09: 75 mL via INTRAVENOUS

## 2023-04-16 ENCOUNTER — Other Ambulatory Visit: Payer: Self-pay

## 2023-04-16 DIAGNOSIS — E119 Type 2 diabetes mellitus without complications: Secondary | ICD-10-CM

## 2023-04-16 DIAGNOSIS — E785 Hyperlipidemia, unspecified: Secondary | ICD-10-CM

## 2023-04-17 ENCOUNTER — Other Ambulatory Visit: Payer: Self-pay | Admitting: Student

## 2023-04-17 ENCOUNTER — Ambulatory Visit: Payer: Medicare HMO

## 2023-04-17 DIAGNOSIS — J4520 Mild intermittent asthma, uncomplicated: Secondary | ICD-10-CM

## 2023-04-17 MED ORDER — SIMVASTATIN 40 MG PO TABS
40.0000 mg | ORAL_TABLET | Freq: Every day | ORAL | 2 refills | Status: DC
Start: 2023-04-17 — End: 2023-09-29

## 2023-04-17 MED ORDER — METFORMIN HCL ER 500 MG PO TB24
ORAL_TABLET | ORAL | 2 refills | Status: DC
Start: 2023-04-17 — End: 2023-09-09

## 2023-04-30 ENCOUNTER — Telehealth: Payer: Self-pay

## 2023-04-30 NOTE — Telephone Encounter (Signed)
Patient LVM on nurse line regarding testing positive for COVID.   Called patient back. She did not answer, LVM asking that she return call to office to further discuss concern.   Veronda Prude, RN

## 2023-05-02 ENCOUNTER — Other Ambulatory Visit: Payer: Self-pay | Admitting: Student

## 2023-05-02 DIAGNOSIS — M545 Low back pain, unspecified: Secondary | ICD-10-CM

## 2023-05-02 MED ORDER — TRAMADOL HCL 50 MG PO TABS: 50.0000 mg | ORAL_TABLET | Freq: Every day | ORAL | 0 refills | Status: DC | PRN

## 2023-05-02 NOTE — Telephone Encounter (Signed)
Chronic pain well controlled on Tramadol

## 2023-06-03 ENCOUNTER — Other Ambulatory Visit: Payer: Self-pay | Admitting: Student

## 2023-06-03 DIAGNOSIS — M545 Low back pain, unspecified: Secondary | ICD-10-CM

## 2023-06-03 DIAGNOSIS — G8929 Other chronic pain: Secondary | ICD-10-CM

## 2023-06-06 ENCOUNTER — Other Ambulatory Visit: Payer: Self-pay | Admitting: Student

## 2023-06-06 DIAGNOSIS — G8929 Other chronic pain: Secondary | ICD-10-CM

## 2023-06-13 ENCOUNTER — Ambulatory Visit: Payer: Medicare HMO | Admitting: Student

## 2023-06-13 ENCOUNTER — Other Ambulatory Visit: Payer: Self-pay

## 2023-06-13 ENCOUNTER — Encounter: Payer: Self-pay | Admitting: Student

## 2023-06-13 VITALS — BP 128/71 | HR 75 | Ht 61.0 in | Wt 289.0 lb

## 2023-06-13 DIAGNOSIS — M5440 Lumbago with sciatica, unspecified side: Secondary | ICD-10-CM | POA: Diagnosis not present

## 2023-06-13 DIAGNOSIS — K219 Gastro-esophageal reflux disease without esophagitis: Secondary | ICD-10-CM

## 2023-06-13 DIAGNOSIS — E1165 Type 2 diabetes mellitus with hyperglycemia: Secondary | ICD-10-CM

## 2023-06-13 DIAGNOSIS — Z7985 Long-term (current) use of injectable non-insulin antidiabetic drugs: Secondary | ICD-10-CM

## 2023-06-13 DIAGNOSIS — G8929 Other chronic pain: Secondary | ICD-10-CM | POA: Diagnosis not present

## 2023-06-13 DIAGNOSIS — Z23 Encounter for immunization: Secondary | ICD-10-CM

## 2023-06-13 DIAGNOSIS — J4520 Mild intermittent asthma, uncomplicated: Secondary | ICD-10-CM | POA: Diagnosis not present

## 2023-06-13 LAB — POCT GLYCOSYLATED HEMOGLOBIN (HGB A1C): HbA1c, POC (controlled diabetic range): 6.3 % (ref 0.0–7.0)

## 2023-06-13 MED ORDER — FAMOTIDINE 20 MG PO TABS: 20.0000 mg | ORAL_TABLET | Freq: Two times a day (BID) | ORAL | 0 refills | Status: DC

## 2023-06-13 MED ORDER — BUDESONIDE-FORMOTEROL FUMARATE 80-4.5 MCG/ACT IN AERO
2.0000 | INHALATION_SPRAY | Freq: Two times a day (BID) | RESPIRATORY_TRACT | 6 refills | Status: AC
Start: 2023-06-13 — End: ?

## 2023-06-13 NOTE — Assessment & Plan Note (Signed)
Well-controlled on tramadol and meloxicam.  The patient takes meloxicam routinely, discussed only taking as needed and discussed the side effects of meloxicam including gastric bleeds.  Discussed side effects to look out for.

## 2023-06-13 NOTE — Assessment & Plan Note (Signed)
Provided H2 blocker for the patient for GERD symptoms

## 2023-06-13 NOTE — Assessment & Plan Note (Signed)
Foot exam performed, obtained Uacr, A1C UTD and well controlled at 6.3.  Patient is currently on a statin.  She is doing well overall.  She seems to have had a plateau in her weight, discussed transitioning to [facility] for possible benefit of more weight loss.  She is limited on medication options secondary to cost and financial barriers.  Will run this by Durward Mallard to see how much Greggory Keen would cost her routinely, provided samples of Ozempic today.  Return in 3 months

## 2023-06-13 NOTE — Patient Instructions (Addendum)
It was great to see you today! Thank you for choosing [facility] for your primary care.  Today we addressed: Have mammogram scheduled, please go to this appt  We can start pepcid twice a day to help with acid reflux  Please see your eye doctor  I will update you with the lab results   If you haven't already, sign up for My Chart to have easy access to your labs results, and communication with your primary care physician. I recommend that you always bring your medications to each appointment as this makes it easy to ensure you are on the correct medications and helps [place] not miss refills when you need them. Call the clinic at [phone] if your symptoms worsen or you have any concerns. Return in about 3 months (around 09/13/2023). Please arrive 15 minutes before your appointment to ensure smooth check in process.  We appreciate your efforts in making this happen.  Thank you for allowing me to participate in your care, Alfredo Martinez, MD 06/13/2023, 1:49 PM PGY-3, [facility]

## 2023-06-13 NOTE — Assessment & Plan Note (Signed)
Refilled Symbicort, well-controlled

## 2023-06-13 NOTE — Progress Notes (Signed)
    SUBJECTIVE:   CHIEF COMPLAINT / HPI:   Medication Refill:   Patient returning for medication refill. Requesting symbicort. She is doing well with albuterol and symbicort    Type 2 Diabetes: Home medications include: Ozempic 2mg  + Metformin. Does endorse compliance.   Most recent A1Cs:  Lab Results  Component Value Date   HGBA1C 6.3 06/13/2023   HGBA1C 6.2 02/11/2023   Last Microalbumin, LDL, Creatinine: Lab Results  Component Value Date   MICROALBUR 30 04/13/2019   LDLCALC 57 04/27/2022   CREATININE 0.60 04/09/2023    Patient is not up to date on diabetic foot exam. Has not had eyes checked. Sees [facility]   Patient also reports symptoms of  GERD such as feeling the taste of acid in her throat and mouth intermittently for quite some time.   PERTINENT  PMH / PSH:  Asthma - On Symbicort  OSA - CPAP GERD  DM2 - Ozempic and Metformin, needs eye exam + foot exam, UAcr Bipolar? Depression  Anxiety - on Celexa and Trazodone, stable  Chronic Back Pain - Tramadol, Meloxicam controlled  Due for flu/COVID vax and shingles vax  OBJECTIVE:   BP 128/71   Pulse 75   Ht 5\' 1"  (1.549 m)   Wt 289 lb (131.1 kg)   LMP 01/17/2014   SpO2 99%   BMI 54.61 kg/m   General: Alert and oriented in no apparent distress; ambulates with walker Heart: Regular rate and rhythm with no murmurs appreciated Lungs: Normal WOB without wheezing  Abdomen: no abdominal pain, obese  Foot Exam:  Diabetic Foot Exam - Simple   Simple Foot Form Visual Inspection No deformities, no ulcerations, no other skin breakdown bilaterally: Yes Sensation Testing Intact to touch and monofilament testing bilaterally: Yes Pulse Check Posterior Tibialis and Dorsalis pulse intact bilaterally: Yes Comments     ASSESSMENT/PLAN:   Assessment & Plan Type 2 diabetes mellitus with hyperglycemia, without long-term current use of insulin (HCC) Foot exam performed, obtained Uacr, A1C UTD and well controlled  at 6.3.  Patient is currently on a statin.  She is doing well overall.  She seems to have had a plateau in her weight, discussed transitioning to [facility] for possible benefit of more weight loss.  She is limited on medication options secondary to cost and financial barriers.  Will run this by Durward Mallard to see how much Greggory Keen would cost her routinely, provided samples of Ozempic today.  Return in 3 months Intermittent asthma, unspecified asthma severity, unspecified whether complicated Refilled Symbicort, well-controlled Gastroesophageal reflux disease, unspecified whether esophagitis present Provided H2 blocker for the patient for GERD symptoms Encounter for immunization COVID, flu given in clinic today Chronic bilateral low back pain with sciatica, sciatica laterality unspecified Well-controlled on tramadol and meloxicam.  The patient takes meloxicam routinely, discussed only taking as needed and discussed the side effects of meloxicam including gastric bleeds.  Discussed side effects to look out for.      Alfredo Martinez, MD [facility]

## 2023-06-14 LAB — LIPID PANEL
Chol/HDL Ratio: 3.6 {ratio} (ref 0.0–4.4)
Cholesterol, Total: 123 mg/dL (ref 100–199)
HDL: 34 mg/dL — ABNORMAL LOW (ref >39)
LDL Chol Calc (NIH): 63 mg/dL (ref 0–99)
Triglycerides: 150 mg/dL — ABNORMAL HIGH (ref 0–149)
VLDL Cholesterol Cal: 26 mg/dL (ref 5–40)

## 2023-06-14 LAB — BASIC METABOLIC PANEL
BUN/Creatinine Ratio: 28 — ABNORMAL HIGH (ref 9–23)
BUN: 18 mg/dL (ref 6–24)
CO2: 25 mmol/L (ref 20–29)
Calcium: 9.6 mg/dL (ref 8.7–10.2)
Chloride: 104 mmol/L (ref 96–106)
Creatinine, Ser: 0.65 mg/dL (ref 0.57–1.00)
Glucose: 101 mg/dL — ABNORMAL HIGH (ref 70–99)
Potassium: 4.4 mmol/L (ref 3.5–5.2)
Sodium: 143 mmol/L (ref 134–144)
eGFR: 102 mL/min/{1.73_m2} (ref >59)

## 2023-06-14 LAB — MICROALBUMIN / CREATININE URINE RATIO
Creatinine, Urine: 112.3 mg/dL
Microalb/Creat Ratio: 5 mg/g{creat} (ref 0–29)
Microalbumin, Urine: 5.8 ug/mL

## 2023-06-19 ENCOUNTER — Ambulatory Visit: Payer: Medicare HMO

## 2023-06-24 ENCOUNTER — Other Ambulatory Visit (HOSPITAL_COMMUNITY): Payer: Self-pay

## 2023-06-24 ENCOUNTER — Encounter: Payer: Self-pay | Admitting: Student

## 2023-06-25 ENCOUNTER — Encounter: Payer: Self-pay | Admitting: Student

## 2023-06-27 ENCOUNTER — Other Ambulatory Visit: Payer: Self-pay | Admitting: Student

## 2023-06-27 DIAGNOSIS — M545 Low back pain, unspecified: Secondary | ICD-10-CM

## 2023-07-01 ENCOUNTER — Other Ambulatory Visit (HOSPITAL_COMMUNITY): Payer: Self-pay

## 2023-07-07 ENCOUNTER — Other Ambulatory Visit: Payer: Self-pay | Admitting: Student

## 2023-07-07 DIAGNOSIS — G8929 Other chronic pain: Secondary | ICD-10-CM

## 2023-07-17 ENCOUNTER — Other Ambulatory Visit: Payer: Self-pay | Admitting: Student

## 2023-07-17 DIAGNOSIS — F319 Bipolar disorder, unspecified: Secondary | ICD-10-CM

## 2023-07-21 ENCOUNTER — Other Ambulatory Visit: Payer: Self-pay | Admitting: Student

## 2023-07-21 DIAGNOSIS — F319 Bipolar disorder, unspecified: Secondary | ICD-10-CM

## 2023-07-21 DIAGNOSIS — F411 Generalized anxiety disorder: Secondary | ICD-10-CM

## 2023-07-30 ENCOUNTER — Other Ambulatory Visit: Payer: Self-pay | Admitting: Student

## 2023-07-30 DIAGNOSIS — M545 Low back pain, unspecified: Secondary | ICD-10-CM

## 2023-07-31 ENCOUNTER — Other Ambulatory Visit: Payer: Self-pay | Admitting: Student

## 2023-07-31 DIAGNOSIS — M545 Low back pain, unspecified: Secondary | ICD-10-CM

## 2023-08-09 ENCOUNTER — Other Ambulatory Visit: Payer: Self-pay | Admitting: Student

## 2023-08-09 DIAGNOSIS — G8929 Other chronic pain: Secondary | ICD-10-CM

## 2023-08-17 ENCOUNTER — Other Ambulatory Visit: Payer: Self-pay | Admitting: Student

## 2023-08-17 DIAGNOSIS — F319 Bipolar disorder, unspecified: Secondary | ICD-10-CM

## 2023-08-23 ENCOUNTER — Other Ambulatory Visit: Payer: Self-pay | Admitting: Student

## 2023-08-23 DIAGNOSIS — G8929 Other chronic pain: Secondary | ICD-10-CM

## 2023-09-01 ENCOUNTER — Other Ambulatory Visit: Payer: Self-pay | Admitting: Student

## 2023-09-01 DIAGNOSIS — M545 Low back pain, unspecified: Secondary | ICD-10-CM

## 2023-09-07 ENCOUNTER — Other Ambulatory Visit: Payer: Self-pay | Admitting: Student

## 2023-09-07 DIAGNOSIS — G8929 Other chronic pain: Secondary | ICD-10-CM

## 2023-09-07 DIAGNOSIS — E119 Type 2 diabetes mellitus without complications: Secondary | ICD-10-CM

## 2023-09-11 ENCOUNTER — Other Ambulatory Visit: Payer: Self-pay | Admitting: Student

## 2023-09-11 DIAGNOSIS — J4520 Mild intermittent asthma, uncomplicated: Secondary | ICD-10-CM

## 2023-09-11 DIAGNOSIS — E119 Type 2 diabetes mellitus without complications: Secondary | ICD-10-CM

## 2023-09-14 IMAGING — MR MR LUMBAR SPINE W/O CM
4 of 5 series · 25 of 48 positions shown · non-contrast
Comparison: X-ray lumbar 08/21/2021.

CLINICAL DATA: Back and leg pain for 1-1/2 years.

EXAM:
MRI LUMBAR SPINE WITHOUT CONTRAST
TECHNIQUE: Multiplanar, multisequence MR imaging of the lumbar spine was
performed. No intravenous contrast was administered.

[Series 2: T2 · sagittal · 4.0mm · 0.59mm/px · 6 of 15 slices shown (1 of 2)]
[im 1/15]
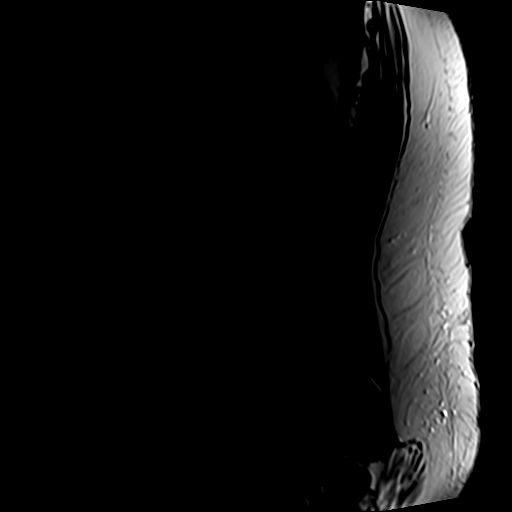
[im 3/15]
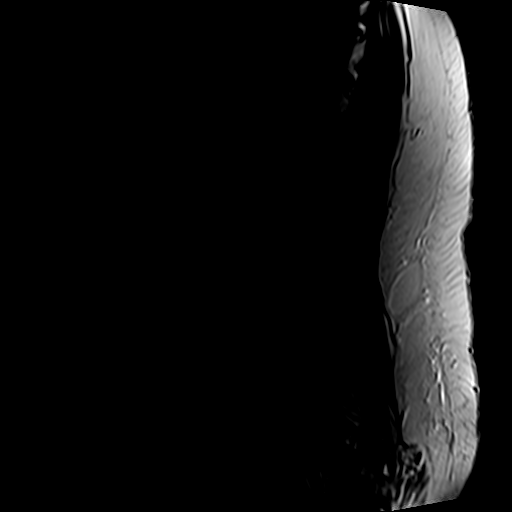
[im 6/15]
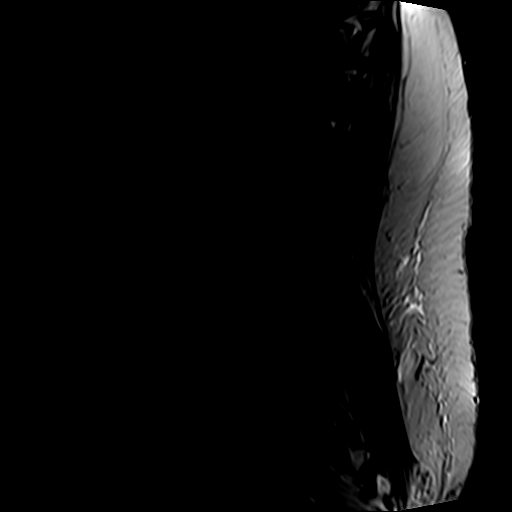
[im 9/15]
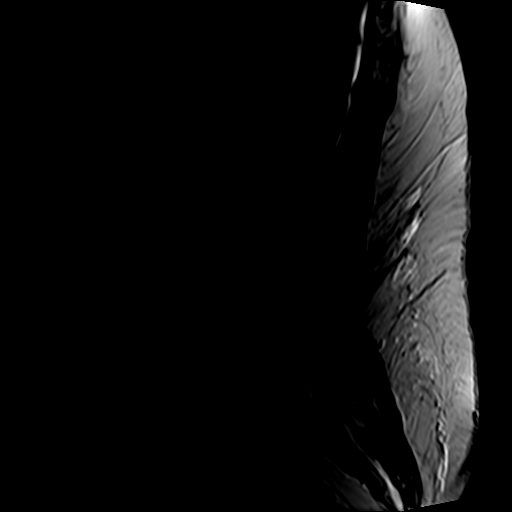
[im 12/15]
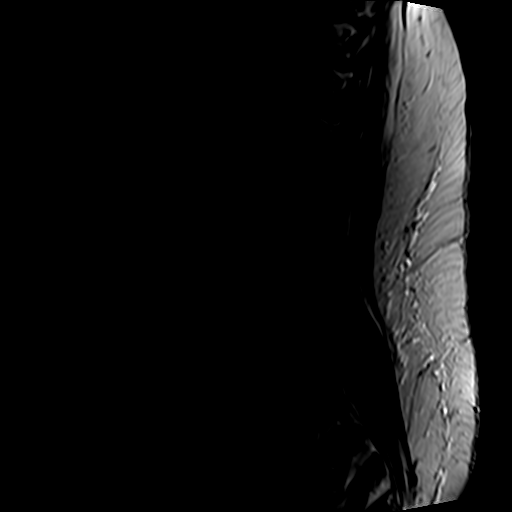
[im 15/15]
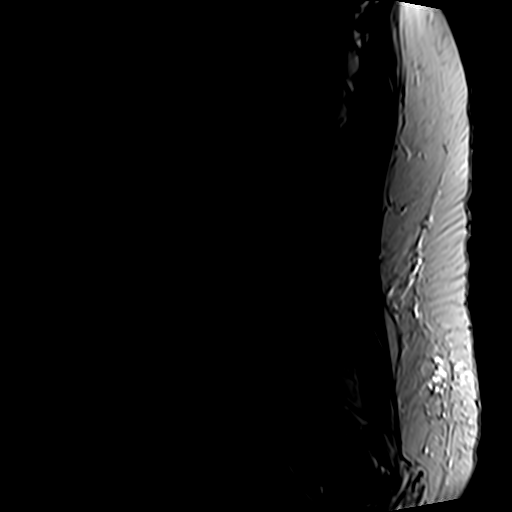

[Series 4: T1 · sagittal · 4.0mm · 0.59mm/px · 6 of 15 slices shown (1 of 2)]
[im 1/15]
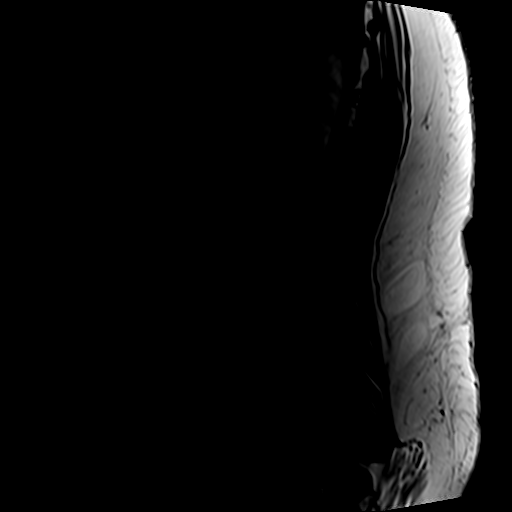
[im 3/15]
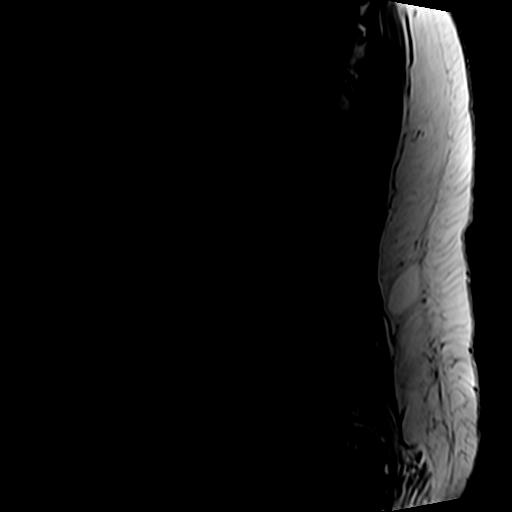
[im 6/15]
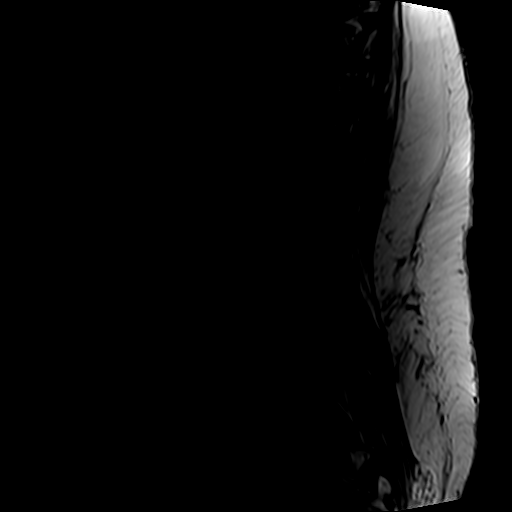
[im 9/15]
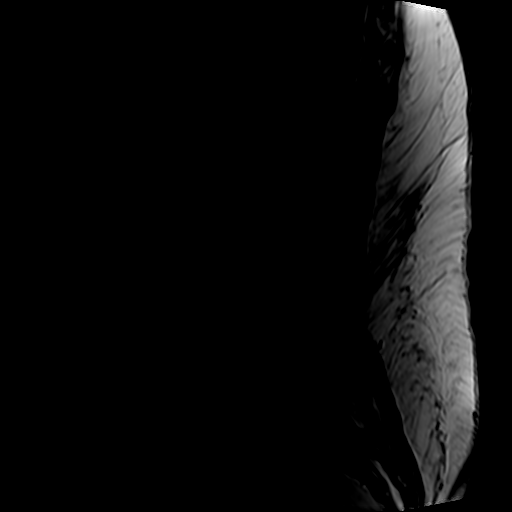
[im 12/15]
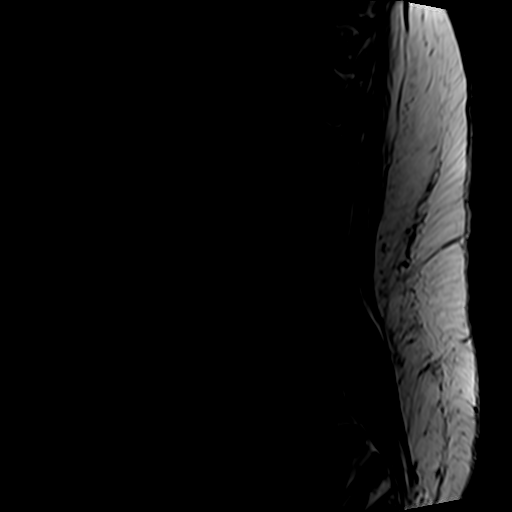
[im 15/15]
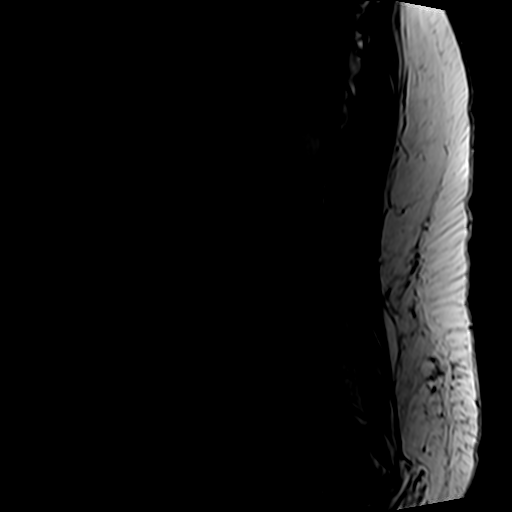

[Series 5: T2 · axial · 4.0mm · 0.70mm/px · z∈[-89,+114]mm · 9 of 36 slices shown (2 of 2)]
[im 1/36]
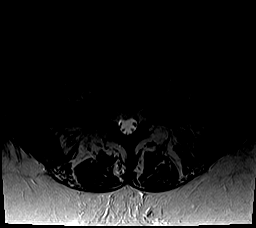
[im 6/36]
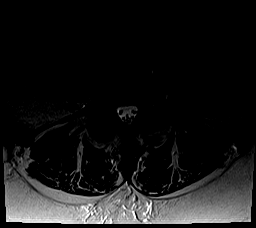
[im 11/36]
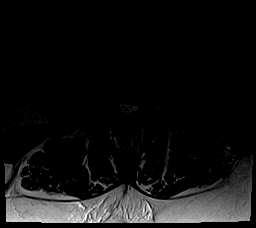
[im 16/36]
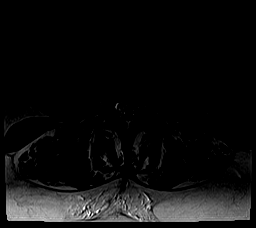
[im 18/36]
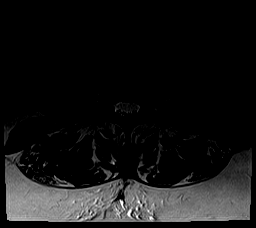
[im 21/36]
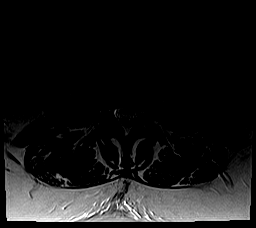
[im 26/36]
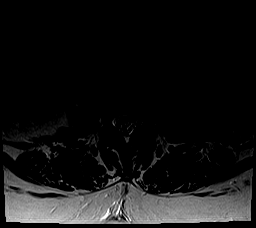
[im 31/36]
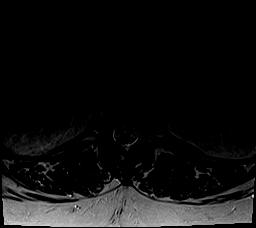
[im 36/36]
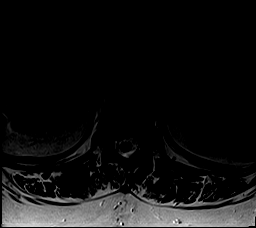

[Series 6: T1 · axial · 4.0mm · 0.35mm/px · z∈[-89,+88]mm · 4 of 36 slices shown (2 of 2)]
[im 1/36]
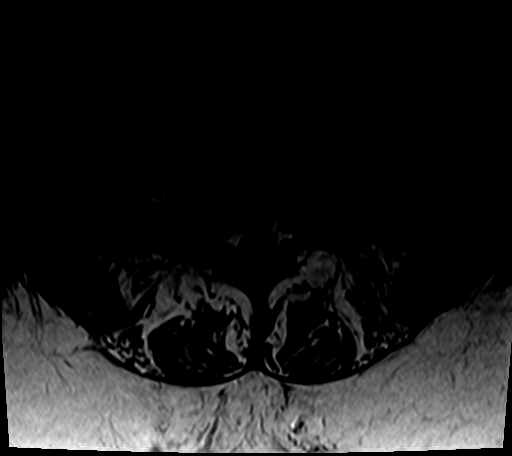
[im 6/36]
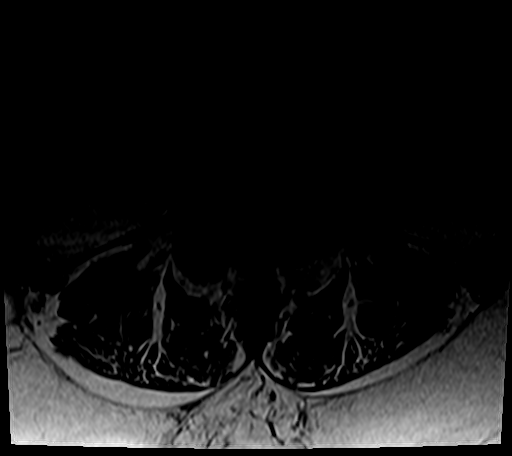
[im 18/36]
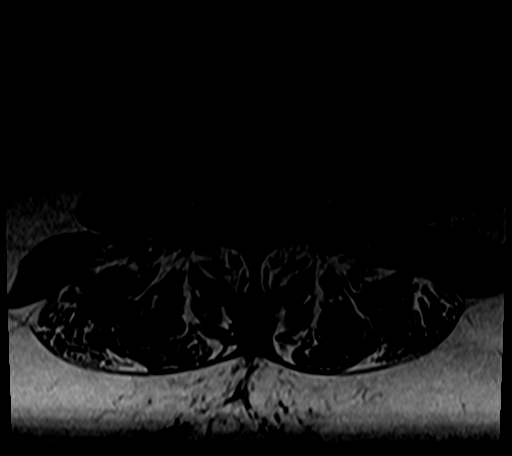
[im 31/36]
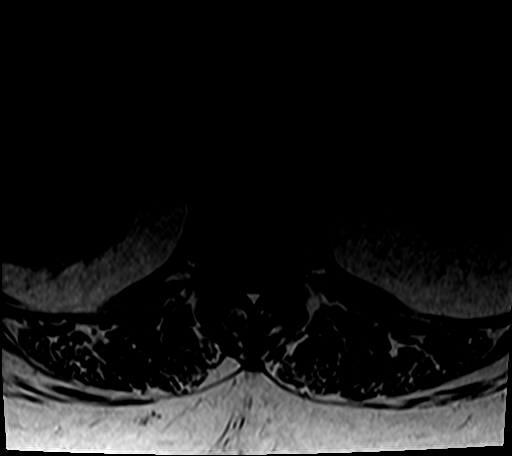

[25 of 48 positions shown; findings below may reference images not displayed]

FINDINGS: Segmentation:  Standard.

Alignment:  Mild levocurvature.  No significant listhesis.

Vertebrae: No acute fracture or suspicious osseous lesion. Mild
wedging of T10 and T11, which appears chronic. Congenitally short
pedicles, which narrow the AP diameter of the spinal canal.

Conus medullaris and cauda equina: Conus extends to the L2 level.
Conus and cauda equina appear normal.

Paraspinal and other soft tissues: Atrophy of the inferior
paraspinous muscles. Otherwise negative.

Disc levels:

T11-T12: Seen only on the sagittal images. Disc height loss with
mild disc bulge. Epidural lipomatosis. Mild thecal sac narrowing. No
neural foraminal narrowing.

T12-L1: No significant disc bulge. No spinal canal stenosis or
neural foraminal narrowing.

L1-L2: No significant disc bulge. No spinal canal stenosis or neural
foraminal narrowing.

L2-L3: No significant disc bulge. No spinal canal stenosis or neural
foraminal narrowing.

L3-L4: No significant disc bulge. Mild facet arthropathy. No spinal
canal stenosis or neural foraminal narrowing.

L4-L5: Minimal disc bulge. Moderate to severe facet arthropathy. No
spinal canal stenosis or neural foraminal narrowing.

L5-S1: No significant disc bulge. Moderate facet arthropathy. No
spinal canal stenosis or neural foraminal narrowing.
IMPRESSION: 1. No spinal canal stenosis or neural foraminal narrowing in the
lumbar spine.
2. Moderate to severe facet arthropathy in the lower lumbar spine
which can be a source of back pain.
3. Mild wedging of T10 and T11, with mild thecal sac narrowing seen
on the sagittal images at T11-T12, due to both a small disc bulge
and epidural lipomatosis at this level.

## 2023-09-16 ENCOUNTER — Other Ambulatory Visit: Payer: Self-pay | Admitting: Student

## 2023-09-16 DIAGNOSIS — F319 Bipolar disorder, unspecified: Secondary | ICD-10-CM

## 2023-09-27 ENCOUNTER — Other Ambulatory Visit: Payer: Self-pay | Admitting: Student

## 2023-09-27 DIAGNOSIS — E785 Hyperlipidemia, unspecified: Secondary | ICD-10-CM

## 2023-09-27 DIAGNOSIS — M545 Low back pain, unspecified: Secondary | ICD-10-CM

## 2023-09-27 DIAGNOSIS — F319 Bipolar disorder, unspecified: Secondary | ICD-10-CM

## 2023-09-27 DIAGNOSIS — E119 Type 2 diabetes mellitus without complications: Secondary | ICD-10-CM

## 2023-10-07 ENCOUNTER — Other Ambulatory Visit: Payer: Self-pay | Admitting: Student

## 2023-10-07 DIAGNOSIS — E119 Type 2 diabetes mellitus without complications: Secondary | ICD-10-CM

## 2023-10-07 DIAGNOSIS — M545 Low back pain, unspecified: Secondary | ICD-10-CM

## 2023-10-18 ENCOUNTER — Other Ambulatory Visit (HOSPITAL_COMMUNITY): Payer: Self-pay

## 2023-10-21 ENCOUNTER — Other Ambulatory Visit: Payer: Self-pay | Admitting: Student

## 2023-10-21 DIAGNOSIS — F319 Bipolar disorder, unspecified: Secondary | ICD-10-CM

## 2023-10-28 ENCOUNTER — Other Ambulatory Visit (HOSPITAL_COMMUNITY): Payer: Self-pay

## 2023-10-28 ENCOUNTER — Telehealth: Payer: Self-pay

## 2023-10-30 ENCOUNTER — Other Ambulatory Visit: Payer: Self-pay | Admitting: Student

## 2023-10-30 DIAGNOSIS — M545 Low back pain, unspecified: Secondary | ICD-10-CM

## 2023-11-05 ENCOUNTER — Ambulatory Visit: Payer: Medicare HMO | Admitting: Student

## 2023-11-05 NOTE — Progress Notes (Signed)
 Pharmacy Medication Assistance Program Note    11/26/2023  Patient ID: Amanda Larson, female   DOB: Aug 26, 1966, 58 y.o.   MRN: [id number]     10/28/2023  [employer]  Man[employer]urer [employer] [facility] Ozempic  Dose of Ozempic 2MG   Type of Radiographer, therapeutic Assistance  Date Application Sent to Patient 11/05/2023  Application Items Requested Application;Proof of Income  Date Application Sent to Prescriber 11/19/2023  Name of Prescriber Todd McDiarmid  Date Application Received From Patient 11/19/2023  Date Application Received From Provider 11/26/2023  Date Application Submitted to Manufacturer 11/26/2023  Method Application Sent to Manufacturer Fax     Renewal - submitted

## 2023-11-08 ENCOUNTER — Ambulatory Visit: Payer: Medicare HMO | Admitting: Student

## 2023-11-08 NOTE — Progress Notes (Deleted)
    SUBJECTIVE:   CHIEF COMPLAINT / HPI:   Blood with Urination: - ongoing *** - dysuria, urinary frequency, flank pain, fever, chills *** - quantity: *** - Vaginal sx: *** - pap NILM 10/2022 negative hpv   PERTINENT  PMH / PSH:  Asthma - On Symbicort  OSA - CPAP GERD  DM2 - Ozempic and Metformin, needs eye exam + foot exam, UAcr Bipolar? Depression  Anxiety - on Celexa and Trazodone, stable  Chronic Back Pain - Tramadol, Meloxicam controlled   OBJECTIVE:   LMP 01/17/2014   General: Alert and oriented in no apparent distress Heart: Regular rate and rhythm with no murmurs appreciated Lungs: CTA bilaterally, no wheezing Abdomen: Bowel sounds present, no abdominal pain Skin: Warm and dry Extremities: No lower extremity edema   ASSESSMENT/PLAN:   Assessment & Plan       Alfredo Martinez, MD [facility]

## 2023-11-09 ENCOUNTER — Other Ambulatory Visit: Payer: Self-pay | Admitting: Student

## 2023-11-09 DIAGNOSIS — G8929 Other chronic pain: Secondary | ICD-10-CM

## 2023-11-12 ENCOUNTER — Ambulatory Visit: Payer: Medicare HMO | Admitting: Student

## 2023-11-12 NOTE — Progress Notes (Deleted)
    SUBJECTIVE:   CHIEF COMPLAINT / HPI:   Blood with Urination: - ongoing *** - dysuria, urinary frequency, flank pain, fever, chills *** - quantity: *** - Vaginal sx: *** - pap NILM 10/2022 negative hpv   PERTINENT  PMH / PSH:  Asthma - On Symbicort  OSA - CPAP GERD  DM2 - Ozempic and Metformin, needs eye exam + foot exam Bipolar? Depression  Anxiety - on Celexa and Trazodone, stable  Chronic Back Pain - Tramadol, Meloxicam controlled   OBJECTIVE:   LMP 01/17/2014   General: Alert and oriented in no apparent distress Heart: Regular rate and rhythm with no murmurs appreciated Lungs: CTA bilaterally, no wheezing Abdomen: Bowel sounds present, no abdominal pain Skin: Warm and dry Extremities: No lower extremity edema   ASSESSMENT/PLAN:   Assessment & Plan       Alfredo Martinez, MD [facility]

## 2023-11-14 ENCOUNTER — Ambulatory Visit: Admitting: Student

## 2023-11-14 VITALS — BP 104/66 | HR 84 | Ht 61.0 in | Wt 297.0 lb

## 2023-11-14 DIAGNOSIS — M6530 Trigger finger, unspecified finger: Secondary | ICD-10-CM

## 2023-11-14 DIAGNOSIS — M25532 Pain in left wrist: Secondary | ICD-10-CM | POA: Diagnosis not present

## 2023-11-14 DIAGNOSIS — E1165 Type 2 diabetes mellitus with hyperglycemia: Secondary | ICD-10-CM

## 2023-11-14 NOTE — Patient Instructions (Addendum)
 It was great to see you! Thank you for allowing me to participate in your care!   Our plans for today:  - Your wrist could be hurting due to a muscle strain or osteoarthritis. I have low concern for a fracture but if you decide you would like an x ray just let me know.  - Use voltaren gel on wrist 4 times daily for pain - I referred you to hand surgery to evaluate your triger finger. They will call to schedule -Please schedule apt with Dr. Jena Gauss for next month for A1c or sooner if needed   Take care and seek immediate care sooner if you develop any concerns.   Dr. Erick Alley, DO [facility]

## 2023-11-14 NOTE — Progress Notes (Signed)
    SUBJECTIVE:   CHIEF COMPLAINT / HPI:    L wrist pain  3-4 weeks ago was going to stand up from recliner, fell back, sat on L wrist. Has been hurting since, worse after waking up, better throughout day but worse with use.  Is taking meloxicam and tramadol daily which she takes for chronic pain.  has not tried anything else.  The pain is not terrible.  Bent fingers States right fifth finger has been bent for the past year, left fourth finger has been meant for at least 6 months.  Also notes feeling a hard bump in her hand beneath those fingers.   Says she went to an orthopedic doctor previously who said she would likely need hand surgery if desired. Not very painful but they do bother her.   PERTINENT  PMH / PSH: T2DM, OA of knee  OBJECTIVE:   BP 104/66   Pulse 84   Ht 5\' 1"  (1.549 m)   Wt 297 lb (134.7 kg)   LMP 01/17/2014   SpO2 99%   BMI 56.12 kg/m    General: NAD, pleasant, able to participate in exam Cardiac: RRR, no murmurs. Respiratory: CTAB, normal effort, No wheezes, rales or rhonchi Abdomen: Bowel sounds present, nontender, nondistended, no hepatosplenomegaly. Extremities: No erythema or edema of left wrist.  Mild TTP along ulnar side of the wrist.  good ROM with wrist flexion, extension, abduction and adduction without pain.  Right fifth finger and left fourth finger flexed, unable to extend passively or manually and they are nonpainful.  Non mobile, nonpainful nodules of palmar hand below both flexed fingers. Neuro: alert, no obvious focal deficits Psych: Normal affect and mood   ASSESSMENT/PLAN:   Wrist pain, left Low concern for wrist fracture given point tenderness is only mild, she has good ROM, injury was not high impact. Did offer to order wrist x-ray to rule out fracture which patient declined.  Pain more likely due to inflammation and likely wrist OA.  Could also be a sprain. -Continue current pain medications as in HPI -Can add Voltaren gel 4 times  daily -Can purchase a wrist splint if desired which may help with pain -Return if pain does not improve or worsens  Trigger finger Permanently flexed fingers with palmar nodules at the base is consistent with trigger finger of right fifth and left fourth fingers.  Given chronic nature, would likely require hand surgery to fix. -Referral placed to hand surgery for evaluation   Patient advised to return ~1 month for A1c and discuss health maintenance with PCP  Dr. Erick Alley, DO [facility]

## 2023-11-15 DIAGNOSIS — M6530 Trigger finger, unspecified finger: Secondary | ICD-10-CM | POA: Insufficient documentation

## 2023-11-15 DIAGNOSIS — M25532 Pain in left wrist: Secondary | ICD-10-CM | POA: Insufficient documentation

## 2023-11-15 NOTE — Assessment & Plan Note (Signed)
 Permanently flexed fingers with palmar nodules at the base is consistent with trigger finger of right fifth and left fourth fingers.  Given chronic nature, would likely require hand surgery to fix. -Referral placed to hand surgery for evaluation

## 2023-11-15 NOTE — Assessment & Plan Note (Signed)
 Low concern for wrist fracture given point tenderness is only mild, she has good ROM, injury was not high impact. Did offer to order wrist x-ray to rule out fracture which patient declined.  Pain more likely due to inflammation and likely wrist OA.  Could also be a sprain. -Continue current pain medications as in HPI -Can add Voltaren gel 4 times daily -Can purchase a wrist splint if desired which may help with pain -Return if pain does not improve or worsens

## 2023-11-19 ENCOUNTER — Other Ambulatory Visit: Payer: Self-pay | Admitting: Student

## 2023-11-19 DIAGNOSIS — F411 Generalized anxiety disorder: Secondary | ICD-10-CM

## 2023-11-19 DIAGNOSIS — G8929 Other chronic pain: Secondary | ICD-10-CM

## 2023-11-19 DIAGNOSIS — F319 Bipolar disorder, unspecified: Secondary | ICD-10-CM

## 2023-11-22 ENCOUNTER — Ambulatory Visit (INDEPENDENT_AMBULATORY_CARE_PROVIDER_SITE_OTHER): Payer: Medicare HMO | Admitting: Family Medicine

## 2023-11-22 VITALS — BP 104/66 | Ht 61.0 in | Wt 297.0 lb

## 2023-11-22 DIAGNOSIS — Z0000 Encounter for general adult medical examination without abnormal findings: Secondary | ICD-10-CM

## 2023-11-22 NOTE — Patient Instructions (Signed)
 Amanda Larson , Thank you for taking time to come for your Medicare Wellness Visit. I appreciate your ongoing commitment to your health goals. Please review the following plan we discussed and let me know if I can assist you in the future.   Referrals/Orders/Follow-Ups/Clinician Recommendations: Discuss with your provider about your vaccines, Diabetic eye exam and appointment, foot exam, and Mammogram appointment.  This is a list of the screening recommended for you and due dates:  Health Maintenance  Topic Date Due   Pneumococcal Vaccination (1 of 2 - PCV) Never done   Zoster (Shingles) Vaccine (1 of 2) Never done   Eye exam for diabetics  10/31/2021   Complete foot exam   01/19/2022   Mammogram  01/13/2023   Cologuard (Stool DNA test)  11/23/2023   Hemoglobin A1C  12/12/2023   Yearly kidney function blood test for diabetes  06/12/2024   Yearly kidney health urinalysis for diabetes  06/12/2024   Medicare Annual Wellness Visit  11/21/2024   Pap with HPV screening  10/23/2027   DTaP/Tdap/Td vaccine (2 - Td or Tdap) 11/04/2030   Flu Shot  Completed   Hepatitis C Screening  Completed   HIV Screening  Completed   HPV Vaccine  Aged Out   COVID-19 Vaccine  Discontinued    Advanced directives: (Declined) Advance directive discussed with you today. Even though you declined this today, please call our office should you change your mind, and we can give you the proper paperwork for you to fill out.  Next Medicare Annual Wellness Visit scheduled for next year: Yes

## 2023-11-22 NOTE — Progress Notes (Signed)
 Subjective:   Amanda Larson is a 58 y.o. who presents for a Medicare Wellness preventive visit.  Visit Complete: Virtual I connected with  Amanda Larson on 11/22/23 by a audio enabled telemedicine application and verified that I am speaking with the correct person using two identifiers.  Patient Location: Home  Provider Location: Office/Clinic  I discussed the limitations of evaluation and management by telemedicine. The patient expressed understanding and agreed to proceed.  Vital Signs: Because this visit was a virtual/telehealth visit, some criteria may be missing or patient reported. Any vitals not documented were not able to be obtained and vitals that have been documented are patient reported.  VideoDeclined- This patient declined Librarian, academic. Therefore the visit was completed with audio only.  Persons Participating in Visit: Patient.  AWV Questionnaire: No: Patient Medicare AWV questionnaire was not completed prior to this visit.  Cardiac Risk Factors include: advanced age (>20men, >60 women);diabetes mellitus;sedentary lifestyle;obesity (BMI >30kg/m2);smoking/ tobacco exposure (former smoker/quit for 12 years now)     Objective:    Today's Vitals   11/22/23 1002  BP: 104/66  Weight: 297 lb (134.7 kg)  Height: 5\' 1"  (1.549 m)  PainSc: 7    Body mass index is 56.12 kg/m.     11/22/2023   10:26 AM 03/29/2023    1:54 PM 02/11/2023    2:21 PM 10/22/2022    1:49 PM 10/12/2022    4:17 PM 08/31/2022   11:43 AM 07/23/2022    2:13 PM  Advanced Directives  Does Patient Have a Medical Advance Directive? No No No No No No No  Would patient like information on creating a medical advance directive?  No - Patient declined No - Patient declined No - Patient declined No - Patient declined Yes (MAU/Ambulatory/Procedural Areas - Information given) No - Patient declined    Current Medications (verified) Outpatient Encounter Medications as of  11/22/2023  Medication Sig   albuterol (VENTOLIN HFA) 108 (90 Base) MCG/ACT inhaler INHALE 2 PUFFS BY MOUTH EVERY 6 HOURS AS NEEDED FOR WHEEZING FOR SHORTNESS OF BREATH   citalopram (CELEXA) 40 MG tablet Take 1 tablet by mouth once daily   famotidine (PEPCID) 20 MG tablet Take 1 tablet (20 mg total) by mouth 2 (two) times daily.   hydrOXYzine (ATARAX) 25 MG tablet TAKE 1 TABLET (25 MG TOTAL) BY MOUTH 3 (THREE) TIMES DAILY AS NEEDED FOR ANXIETY.   Melatonin 5 MG CAPS Take 3 capsules by mouth at bedtime.   meloxicam (MOBIC) 15 MG tablet TAKE 1 TABLET BY MOUTH ONCE DAILY . APPOINTMENT REQUIRED FOR FUTURE REFILLS   metFORMIN (GLUCOPHAGE-XR) 500 MG 24 hr tablet TAKE 4 TABLETS BY MOUTH ONCE DAILY   Multiple Vitamins-Minerals (MULTIVITAMIN GUMMIES ADULT PO) Take 1 Dose by mouth daily.   Omega-3 Fatty Acids (FISH OIL) 1000 MG CAPS Take by mouth.   pregabalin (LYRICA) 100 MG capsule Take 1 capsule by mouth twice daily   Semaglutide, 2 MG/DOSE, (OZEMPIC, 2 MG/DOSE,) 8 MG/3ML SOPN Inject 2 mg into the skin once a week.   simvastatin (ZOCOR) 40 MG tablet TAKE 1 TABLET BY MOUTH AT BEDTIME   traMADol (ULTRAM) 50 MG tablet TAKE 1 TABLET BY MOUTH ONCE DAILY AS NEEDED   traZODone (DESYREL) 50 MG tablet TAKE 1 TABLET BY MOUTH AT BEDTIME   benzonatate (TESSALON) 100 MG capsule Take 1 capsule (100 mg total) by mouth 2 (two) times daily as needed for cough. (Patient not taking: Reported on 11/22/2023)  budesonide-formoterol (SYMBICORT) 80-4.5 MCG/ACT inhaler Inhale 2 puffs into the lungs 2 (two) times daily. (Patient not taking: Reported on 11/22/2023)   No facility-administered encounter medications on file as of 11/22/2023.    Allergies (verified) Sulfa antibiotics and Clarithromycin   History: Past Medical History:  Diagnosis Date   Bronchitis    Chronic back pain    Depression    Diabetes mellitus without complication (HCC)    Osteoarthritis    Past Surgical History:  Procedure Laterality Date    CESAREAN SECTION     CHOLECYSTECTOMY     TUBAL LIGATION     Family History  Problem Relation Age of Onset   Cancer Father        skin   Heart disease Paternal Grandfather    Stroke Paternal Grandfather    Drug abuse Paternal Grandfather    High Cholesterol Mother    Diabetes Maternal Aunt    Hypertension Maternal Aunt    Diabetes Maternal Uncle    Social History   Socioeconomic History   Marital status: Significant Other    Spouse name: Not on file   Number of children: 3   Years of education: Not on file   Highest education level: Some college, no degree  Occupational History   Occupation: Disabled  Tobacco Use   Smoking status: Former    Current packs/day: 0.00    Average packs/day: 1 pack/day for 27.9 years (27.9 ttl pk-yrs)    Types: Cigarettes    Start date: 09/11/1979    Quit date: 07/31/2007    Years since quitting: 16.3    Passive exposure: Past   Smokeless tobacco: Never  Vaping Use   Vaping status: Never Used  Substance and Sexual Activity   Alcohol use: Not Currently    Comment: 3 drinks a year    Drug use: Not Currently    Types: Marijuana   Sexual activity: Yes    Birth control/protection: Surgical  Other Topics Concern   Not on file  Social History Narrative   Not on file   Social Drivers of Health   Financial Resource Strain: Low Risk  (11/22/2023)   Overall Financial Resource Strain (CARDIA)    Difficulty of Paying Living Expenses: Not hard at all  Food Insecurity: No Food Insecurity (11/22/2023)   Hunger Vital Sign    Worried About Running Out of Food in the Last Year: Never true    Ran Out of Food in the Last Year: Never true  Transportation Needs: No Transportation Needs (11/22/2023)   PRAPARE - Administrator, Civil Service (Medical): No    Lack of Transportation (Non-Medical): No  Physical Activity: Inactive (11/22/2023)   Exercise Vital Sign    Days of Exercise per Week: 7 days    Minutes of Exercise per Session: 0 min   Stress: Stress Concern Present (11/22/2023)   [employer] of Occupational Health - Occupational Stress Questionnaire    Feeling of Stress : To some extent  Social Connections: Moderately Isolated (11/22/2023)   Social Connection and Isolation Panel [NHANES]    Frequency of Communication with Friends and Family: More than three times a week    Frequency of Social Gatherings with Friends and Family: More than three times a week    Attends Religious Services: Never    Database administrator or Organizations: No    Attends Banker Meetings: Never    Marital Status: Married    Tobacco Counseling Counseling given: Yes  Clinical Intake:  Pre-visit preparation completed: Yes  Pain : 0-10 Pain Score: 7  (chronic pain in back and knee) Pain Type: Chronic pain Pain Location: Knee (both knees) Pain Orientation:  (both knees) Pain Descriptors / Indicators: Burning, Aching Pain Onset: Other (comment) (per pt had it for a long time-chronic pain) Pain Frequency: Constant Pain Relieving Factors: pain med see med list Effect of Pain on Daily Activities: yes  Pain Relieving Factors: pain med see med list  BMI - recorded: 56.15 Nutritional Status: BMI > 30  Obese Nutritional Risks: Unintentional weight gain Diabetes: Yes CBG done?: Yes (130 before pt ate) CBG resulted in Enter/ Edit results?: No Did pt. 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 0  Vision? 0   Comment wears glasses  Difficulty concentrating or making decisions? 0  Walking or climbing stairs? 0  Dressing or bathing? 0  Doing errands, shopping? 0  Preparing Food and eating ? N  Using the Toilet? N  In the past six months, have you accidently leaked urine? N  Do you have problems with loss of bowel control? N  Managing your Medications? N  Managing your Finances? N  Housekeeping or managing your Housekeeping? N    Patient Care Team: Alfredo Martinez, MD as PCP - General (Family Medicine)Indicate any recent Medical Services you may have received from other than [facility] providers in the past year (date may be approximate).     Assessment:   This is a routine wellness examination for Sulema.  Hearing/Vision screen Hearing Screening - Comments:: Denied hearing dif. Vision Screening - Comments:: Updated w/vision. Its due for diab eye exam-pt aware  [facility]   Goals Addressed             This Visit's Progress    COMPLETED: Coping Skills Enhanced       error     Patient Stated       Per pt would like to see her pt grandchildren more this year       Depression Screen     11/22/2023   10:17 AM 06/13/2023    1:36 PM 03/29/2023    1:28 PM 02/11/2023    2:22 PM 10/22/2022    1:49 PM 10/15/2022    2:13 PM 10/12/2022    4:17 PM  PHQ 2/9 Scores  PHQ - 2 Score 2 3 6 2 2 2 2   PHQ- 9 Score 5 9 24 7 6 4 7     Fall Risk     11/22/2023   10:30 AM 11/14/2023    2:50 PM 06/13/2023    1:36 PM 10/15/2022    2:15 PM 10/12/2022    4:17 PM  Fall Risk   Falls in the past year? 1 1 0 1 0  Number falls in past yr: 0 0 0 1 0  Injury with Fall? 0 1  1 0  Risk for fall due to : Impaired balance/gait;Impaired  mobility   Impaired mobility   Follow up Falls evaluation completed;Education provided   Falls evaluation completed     MEDICARE RISK AT HOME:  Medicare Risk at Home Any stairs in or around the home?: No If so, are there any without handrails?: No Home free of loose  throw rugs in walkways, pet beds, electrical cords, etc?: Yes Adequate lighting in your home to reduce risk of falls?: Yes Life alert?: No Use of a cane, walker or w/c?: Yes Grab bars in the bathroom?: Yes Shower chair or bench in shower?: Yes Elevated toilet seat or a handicapped toilet?: Yes  TIMED UP AND GO:  Was the test performed?  No  Cognitive Function: 6CIT completed        11/22/2023   10:32 AM 10/15/2022    2:17 PM  6CIT Screen  What Year? 0 points 0 points  What month? 0 points 0 points  What time? 3 points 0 points  Count back from 20 0 points 0 points  Months in reverse 4 points 0 points  Repeat phrase  8 points  Total Score  8 points    Immunizations Immunization History  Administered Date(s) Administered   Influenza, Seasonal, Injecte, Preservative Fre 06/13/2023   Influenza,inj,Quad PF,6+ Mos 07/31/2021   PFIZER(Purple Top)SARS-COV-2 Vaccination 04/20/2020, 05/11/2020   Pfizer(Comirnaty)Fall Seasonal Vaccine 12 years and older 06/13/2023   Tdap 11/04/2020    Screening Tests Health Maintenance  Topic Date Due   Pneumococcal Vaccine 34-78 Years old (1 of 2 - PCV) Never done   Zoster Vaccines- Shingrix (1 of 2) Never done   OPHTHALMOLOGY EXAM  10/31/2021   FOOT EXAM  01/19/2022   MAMMOGRAM  01/13/2023   Fecal DNA (Cologuard)  11/23/2023   HEMOGLOBIN A1C  12/12/2023   Diabetic kidney evaluation - eGFR measurement  06/12/2024   Diabetic kidney evaluation - Urine ACR  06/12/2024   Medicare Annual Wellness (AWV)  11/21/2024   Cervical Cancer Screening (HPV/Pap Cotest)  10/23/2027   DTaP/Tdap/Td (2 - Td or Tdap) 11/04/2030   INFLUENZA VACCINE  Completed   Hepatitis C Screening  Completed   HIV Screening  Completed   HPV VACCINES  Aged Out   COVID-19 Vaccine  Discontinued    Health Maintenance  Health Maintenance Due  Topic Date Due   Pneumococcal Vaccine 45-30 Years old (1 of 2 - PCV) Never done   Zoster Vaccines- Shingrix (1 of 2) Never done    OPHTHALMOLOGY EXAM  10/31/2021   FOOT EXAM  01/19/2022   MAMMOGRAM  01/13/2023   Health Maintenance Items Addressed: See Nurse Notes  Additional Screening:  Vision Screening: Recommended annual ophthalmology exams for early detection of glaucoma and other disorders of the eye.  Dental Screening: Recommended annual dental exams for proper oral hygiene  Community Resource Referral / Chronic Care Management: CRR required this visit?  No   CCM required this visit?  No     Plan:     I have personally reviewed and noted the following in the patient's chart:   Medical and social history Use of alcohol, tobacco or illicit drugs  Current medications and supplements including opioid prescriptions. Patient is not currently taking opioid prescriptions. Functional ability and status Nutritional status Physical activity Advanced directives List of other physicians Hospitalizations, surgeries, and ER visits in previous 12 months Vitals Screenings to include cognitive, depression, and falls Referrals and appointments  In addition, I have reviewed and discussed with patient certain preventive protocols, quality metrics, and best practice  recommendations. A written personalized care plan for preventive services as well as general preventive health recommendations were provided to patient.     Arta Silence, CMA   11/22/2023   After Visit Summary: (MyChart) Due to this being a telephonic visit, the after visit summary with patients personalized plan was offered to patient via MyChart   Notes: Please refer to Routing Comments.

## 2023-11-25 ENCOUNTER — Telehealth: Payer: Self-pay

## 2023-11-25 NOTE — Telephone Encounter (Signed)
 Pregablin is requiring a prior authorization.   Will forward to pharmacy team for assistance.

## 2023-11-26 ENCOUNTER — Other Ambulatory Visit: Payer: Self-pay | Admitting: Student

## 2023-11-26 DIAGNOSIS — M545 Low back pain, unspecified: Secondary | ICD-10-CM

## 2023-11-26 NOTE — Telephone Encounter (Signed)
 Medication approved through 09/09/2024.  Pharmacy has been updated.  Patient has been updated as well.

## 2023-11-29 NOTE — Progress Notes (Signed)
 I have reviewed the patient's updated history, problem list, medications and allergies. I have reviewed the AWV provider's notations.

## 2023-12-14 ENCOUNTER — Other Ambulatory Visit: Payer: Self-pay | Admitting: Student

## 2023-12-14 DIAGNOSIS — G8929 Other chronic pain: Secondary | ICD-10-CM

## 2023-12-16 ENCOUNTER — Other Ambulatory Visit: Payer: Self-pay

## 2023-12-16 DIAGNOSIS — G8929 Other chronic pain: Secondary | ICD-10-CM

## 2023-12-20 ENCOUNTER — Other Ambulatory Visit: Payer: Self-pay | Admitting: Student

## 2023-12-20 DIAGNOSIS — F319 Bipolar disorder, unspecified: Secondary | ICD-10-CM

## 2023-12-22 ENCOUNTER — Other Ambulatory Visit: Payer: Self-pay | Admitting: Student

## 2023-12-22 DIAGNOSIS — E119 Type 2 diabetes mellitus without complications: Secondary | ICD-10-CM

## 2023-12-22 DIAGNOSIS — E785 Hyperlipidemia, unspecified: Secondary | ICD-10-CM

## 2023-12-26 ENCOUNTER — Other Ambulatory Visit: Payer: Self-pay | Admitting: Student

## 2023-12-26 DIAGNOSIS — M545 Low back pain, unspecified: Secondary | ICD-10-CM

## 2023-12-31 ENCOUNTER — Other Ambulatory Visit (HOSPITAL_COMMUNITY): Payer: Self-pay

## 2024-01-02 ENCOUNTER — Other Ambulatory Visit (HOSPITAL_COMMUNITY): Payer: Self-pay

## 2024-01-02 ENCOUNTER — Telehealth: Payer: Self-pay

## 2024-01-02 NOTE — Telephone Encounter (Signed)
 Rec'd fax from [facility]:     Attempted 2nd test claim. Medication was filled at pharmacy 12/31/23

## 2024-01-02 NOTE — Telephone Encounter (Signed)
 Pharmacy Patient Advocate Encounter   Received notification from CoverMyMeds that prior authorization for TRAMADOL  is required/requested.   Insurance verification completed.   The patient is insured through [employer] .   PA required; PA submitted to above mentioned insurance via Fax Key/confirmation #/EOC - Status is pending  Faxed form to [phone]

## 2024-01-08 ENCOUNTER — Other Ambulatory Visit: Payer: Self-pay | Admitting: Student

## 2024-01-08 DIAGNOSIS — E119 Type 2 diabetes mellitus without complications: Secondary | ICD-10-CM

## 2024-01-10 NOTE — Telephone Encounter (Signed)
 Refaxed with updated qty for Ozempic  - 4 pens

## 2024-01-15 ENCOUNTER — Other Ambulatory Visit: Payer: Self-pay | Admitting: Student

## 2024-01-15 DIAGNOSIS — G8929 Other chronic pain: Secondary | ICD-10-CM

## 2024-01-22 NOTE — Progress Notes (Signed)
 Pharmacy Medication Assistance Program Note    01/22/2024  Patient ID: Amanda Larson, female   DOB: 21-Nov-1965, 58 y.o.   MRN: [id number]     10/28/2023  Outreach Medication One  Manufacturer Medication One Novo Nordisk  Nordisk Drugs Ozempic   Dose of Ozempic  2MG   Type of Radiographer, therapeutic Assistance  Date Application Sent to Patient 11/05/2023  Application Items Requested Application;Proof of Income  Date Application Sent to Prescriber 11/19/2023  Name of Prescriber Ena Harries McDiarmid  Date Application Received From Patient 11/19/2023  Date Application Received From Provider 11/26/2023  Date Application Submitted to Manufacturer 11/26/2023  Method Application Sent to Manufacturer Fax  Patient Assistance Determination Approved  Approval End Date 09/09/2024    RENEWAL APPROVED.

## 2024-01-25 ENCOUNTER — Other Ambulatory Visit: Payer: Self-pay | Admitting: Student

## 2024-01-25 DIAGNOSIS — M545 Low back pain, unspecified: Secondary | ICD-10-CM

## 2024-01-28 ENCOUNTER — Telehealth: Payer: Self-pay

## 2024-01-28 NOTE — Telephone Encounter (Signed)
 4 boxes of Ozempic  2mg  dose pens are labeled in med room fridge.

## 2024-02-12 NOTE — Telephone Encounter (Signed)
 Informed patient that her novo nordisk shipment is ready for pickup.

## 2024-02-17 NOTE — Telephone Encounter (Signed)
 Patient's daughter presents to clinic to pick up shipment.   Provided medication per note from [place].   Elsie Halo, RN

## 2024-02-18 ENCOUNTER — Encounter: Payer: Self-pay | Admitting: *Deleted

## 2024-02-22 ENCOUNTER — Other Ambulatory Visit: Payer: Self-pay | Admitting: Student

## 2024-02-22 DIAGNOSIS — G8929 Other chronic pain: Secondary | ICD-10-CM

## 2024-02-26 ENCOUNTER — Telehealth: Payer: Self-pay

## 2024-02-26 DIAGNOSIS — M5442 Lumbago with sciatica, left side: Secondary | ICD-10-CM

## 2024-02-26 MED ORDER — PREGABALIN 100 MG PO CAPS: 100.0000 mg | ORAL_CAPSULE | Freq: Two times a day (BID) | ORAL | 0 refills | Status: DC

## 2024-02-26 NOTE — Telephone Encounter (Signed)
 Patient calls nurse line in regards to Pregabalin  prescription.   Prescription was sent in, however was set to print.   Will forward to PCP to resend.

## 2024-02-28 ENCOUNTER — Other Ambulatory Visit: Payer: Self-pay | Admitting: Student

## 2024-02-28 DIAGNOSIS — M545 Low back pain, unspecified: Secondary | ICD-10-CM

## 2024-03-09 ENCOUNTER — Other Ambulatory Visit: Payer: Self-pay | Admitting: Student

## 2024-03-09 DIAGNOSIS — M545 Low back pain, unspecified: Secondary | ICD-10-CM

## 2024-03-25 ENCOUNTER — Other Ambulatory Visit: Payer: Self-pay

## 2024-03-25 DIAGNOSIS — M545 Low back pain, unspecified: Secondary | ICD-10-CM

## 2024-03-25 DIAGNOSIS — F411 Generalized anxiety disorder: Secondary | ICD-10-CM

## 2024-03-25 DIAGNOSIS — E119 Type 2 diabetes mellitus without complications: Secondary | ICD-10-CM

## 2024-03-25 DIAGNOSIS — F319 Bipolar disorder, unspecified: Secondary | ICD-10-CM

## 2024-03-26 MED ORDER — CITALOPRAM HYDROBROMIDE 40 MG PO TABS
40.0000 mg | ORAL_TABLET | Freq: Every day | ORAL | 0 refills | Status: DC
Start: 2024-03-26 — End: 2024-06-25

## 2024-03-26 MED ORDER — MELOXICAM 15 MG PO TABS: 15.0000 mg | ORAL_TABLET | Freq: Every day | ORAL | 0 refills | Status: DC

## 2024-03-26 MED ORDER — METFORMIN HCL ER 500 MG PO TB24: ORAL_TABLET | ORAL | 0 refills | Status: DC

## 2024-03-26 MED ORDER — TRAMADOL HCL 50 MG PO TABS: 50.0000 mg | ORAL_TABLET | Freq: Every day | ORAL | 0 refills | Status: DC | PRN

## 2024-03-26 NOTE — Telephone Encounter (Signed)
 I'm refilling these, but please contact the patient to schedule an appointment with me.   Thanks

## 2024-03-30 ENCOUNTER — Other Ambulatory Visit: Payer: Self-pay

## 2024-03-30 DIAGNOSIS — E785 Hyperlipidemia, unspecified: Secondary | ICD-10-CM

## 2024-03-30 DIAGNOSIS — E119 Type 2 diabetes mellitus without complications: Secondary | ICD-10-CM

## 2024-03-30 DIAGNOSIS — F319 Bipolar disorder, unspecified: Secondary | ICD-10-CM

## 2024-03-31 MED ORDER — SIMVASTATIN 40 MG PO TABS: 40.0000 mg | ORAL_TABLET | Freq: Every day | ORAL | 2 refills | Status: DC

## 2024-03-31 MED ORDER — TRAZODONE HCL 50 MG PO TABS: 50.0000 mg | ORAL_TABLET | Freq: Every day | ORAL | 2 refills | Status: DC

## 2024-04-03 ENCOUNTER — Ambulatory Visit

## 2024-04-03 NOTE — Progress Notes (Deleted)
    SUBJECTIVE:   CHIEF COMPLAINT / HPI:   ***  PERTINENT  PMH / PSH: ***  OBJECTIVE:   LMP 01/17/2014   ***  ASSESSMENT/PLAN:   Assessment & Plan      Raguel KANDICE Lee, DO [facility]

## 2024-04-10 ENCOUNTER — Telehealth: Payer: Self-pay

## 2024-04-10 ENCOUNTER — Encounter: Payer: Self-pay | Admitting: Family Medicine

## 2024-04-10 ENCOUNTER — Ambulatory Visit (INDEPENDENT_AMBULATORY_CARE_PROVIDER_SITE_OTHER): Admitting: Family Medicine

## 2024-04-10 VITALS — BP 116/77 | HR 91 | Ht 61.5 in | Wt 286.2 lb

## 2024-04-10 DIAGNOSIS — Z599 Problem related to housing and economic circumstances, unspecified: Secondary | ICD-10-CM | POA: Diagnosis not present

## 2024-04-10 DIAGNOSIS — F3341 Major depressive disorder, recurrent, in partial remission: Secondary | ICD-10-CM

## 2024-04-10 DIAGNOSIS — E1149 Type 2 diabetes mellitus with other diabetic neurological complication: Secondary | ICD-10-CM | POA: Diagnosis not present

## 2024-04-10 DIAGNOSIS — G8929 Other chronic pain: Secondary | ICD-10-CM | POA: Diagnosis not present

## 2024-04-10 DIAGNOSIS — F411 Generalized anxiety disorder: Secondary | ICD-10-CM | POA: Diagnosis not present

## 2024-04-10 DIAGNOSIS — K219 Gastro-esophageal reflux disease without esophagitis: Secondary | ICD-10-CM

## 2024-04-10 DIAGNOSIS — M1712 Unilateral primary osteoarthritis, left knee: Secondary | ICD-10-CM | POA: Diagnosis not present

## 2024-04-10 DIAGNOSIS — Z6841 Body Mass Index (BMI) 40.0 and over, adult: Secondary | ICD-10-CM

## 2024-04-10 DIAGNOSIS — M545 Low back pain, unspecified: Secondary | ICD-10-CM | POA: Diagnosis not present

## 2024-04-10 MED ORDER — HYDROXYZINE HCL 25 MG PO TABS
ORAL_TABLET | Freq: Three times a day (TID) | ORAL | 0 refills | Status: DC | PRN
Start: 2024-04-10 — End: 2024-05-12

## 2024-04-10 MED ORDER — MELOXICAM 15 MG PO TABS
15.0000 mg | ORAL_TABLET | Freq: Every day | ORAL | 0 refills | Status: DC
Start: 2024-04-10 — End: 2024-06-05

## 2024-04-10 MED ORDER — TIZANIDINE HCL 4 MG PO TABS: 4.0000 mg | ORAL_TABLET | Freq: Three times a day (TID) | ORAL | 0 refills | Status: DC | PRN

## 2024-04-10 MED ORDER — FAMOTIDINE 20 MG PO TABS
20.0000 mg | ORAL_TABLET | Freq: Two times a day (BID) | ORAL | 0 refills | Status: DC
Start: 2024-04-10 — End: 2024-07-23

## 2024-04-10 NOTE — Patient Instructions (Signed)
 I refilled medications.  I have tizanidine for you and PT for the chronic pain. I will also have the care managers call you to help with financial difficulties.  Come back in 1 month to visit PCP for weight loss and arthritis.

## 2024-04-10 NOTE — Telephone Encounter (Signed)
 In process of completing Novo Nordisk refill form for patients Ozempic  2mg  dose pens.

## 2024-04-10 NOTE — Assessment & Plan Note (Signed)
 Unfortunately longstanding pain.  She is on tramadol  and meloxicam  as above.  Unfortunately she did not tolerate Lyrica .  Will send in a trial of tizanidine given her descriptions of possible muscle spasms.  Will also send referral for home health physical therapy since it will be difficult for her to leave the house due to social factors.  I question some potential component of fibromyalgia; can consider switching SSRI to SNRI to help augment pain control.  Can also try gabapentin .  We discussed weight loss as above to also help with osteoarthritis and back pain.

## 2024-04-10 NOTE — Assessment & Plan Note (Signed)
 Continue famotidine .  We did discuss long-term use of meloxicam  and how that could increase risk of gastric ulcers.  Hopefully we can decrease use over time.  If GERD continues to bother her, would favor endoscopic evaluation with GI.

## 2024-04-10 NOTE — Assessment & Plan Note (Signed)
 Most recent A1c controlled.  Unable to obtain A1c today given time constraints and lack of phlebotomy in our office.  Continue Ozempic .  Can consider switching to tirzepatide for increased weight loss effects.  She will follow-up in 1 month to chat more about weight loss.

## 2024-04-10 NOTE — Assessment & Plan Note (Signed)
 PHQ-9 with positive #9 today; patient states she has no active SI.  Her mood intermittently stems from her social situation.  She mentions she would never actually hurt herself.  Will refer to be BCI as below.  Continue citalopram  and hydroxyzine .  Can consider transition of citalopram  to an SNRI like duloxetine/milnacipran as below.

## 2024-04-10 NOTE — Progress Notes (Signed)
    SUBJECTIVE:   CHIEF COMPLAINT / HPI:   Chronic bilateral low back pain, osteoarthritis of the hands and hips Has been dealing with pain for a very long time.  She takes meloxicam  daily for which she is a refill.  Pain is in her lower back and all over her body where she has arthritis.  She also has trigger finger of the bilateral hands and was supposed to see a hand surgeon; however, she was unable to go due to social constraints.  She has never tried muscle relaxers.  She has tried PT in the past but would be open to this again.  T2DM, elevated BMI Takes Ozempic  without issue.  Generalized anxiety disorder, MDD She needs a refill of her hydroxyzine .  PERTINENT  PMH / PSH: OSA, GERD, bipolar 1 disorder  OBJECTIVE:   BP 116/77   Pulse 91   Ht 5' 1.5 (1.562 m)   Wt 286 lb 3.2 oz (129.8 kg)   LMP 01/17/2014   SpO2 100%   BMI 53.20 kg/m   General: Alert and oriented, in NAD Skin: Warm, dry, and intact; linear psoriatic plaque along right lateral forearm and elbow HEENT: NCAT, EOM grossly normal, midline nasal septum Cardiac: RRR, no m/r/g appreciated Respiratory: CTAB, breathing and speaking comfortably on RA Extremities: Moves all extremities grossly equally, bilateral hands with arthritic changes Neurological: No gross focal deficit Psychiatric: Flat affect  ASSESSMENT/PLAN:   Assessment & Plan Type 2 diabetes mellitus with neurological complications (HCC) BMI 50.0-59.9, adult (HCC) Most recent A1c controlled.  Unable to obtain A1c today given time constraints and lack of phlebotomy in our office.  Continue Ozempic .  Can consider switching to tirzepatide for increased weight loss effects.  She will follow-up in 1 month to chat more about weight loss. Generalized anxiety disorder Recurrent major depressive disorder, in partial remission (HCC) PHQ-9 with positive #9 today; patient states she has no active SI.  Her mood intermittently stems from her social situation.  She  mentions she would never actually hurt herself.  Will refer to be BCI as below.  Continue citalopram  and hydroxyzine .  Can consider transition of citalopram  to an SNRI like duloxetine/milnacipran as below. Gastroesophageal reflux disease, unspecified whether esophagitis present Continue famotidine .  We did discuss long-term use of meloxicam  and how that could increase risk of gastric ulcers.  Hopefully we can decrease use over time.  If GERD continues to bother her, would favor endoscopic evaluation with GI. Chronic bilateral low back pain without sciatica Osteoarthritis of left knee, unspecified osteoarthritis type Unfortunately longstanding pain.  She is on tramadol  and meloxicam  as above.  Unfortunately she did not tolerate Lyrica .  Will send in a trial of tizanidine given her descriptions of possible muscle spasms.  Will also send referral for home health physical therapy since it will be difficult for her to leave the house due to social factors.  I question some potential component of fibromyalgia; can consider switching SSRI to SNRI to help augment pain control.  Can also try gabapentin .  We discussed weight loss as above to also help with osteoarthritis and back pain. Financial difficulties Unfortunately affecting health as above.  I have referred to be PCI care management to help with this.  Stuart Redo, MD [facility]

## 2024-04-14 DIAGNOSIS — Z7985 Long-term (current) use of injectable non-insulin antidiabetic drugs: Secondary | ICD-10-CM | POA: Diagnosis not present

## 2024-04-14 DIAGNOSIS — M1712 Unilateral primary osteoarthritis, left knee: Secondary | ICD-10-CM | POA: Diagnosis not present

## 2024-04-14 DIAGNOSIS — G4733 Obstructive sleep apnea (adult) (pediatric): Secondary | ICD-10-CM | POA: Diagnosis not present

## 2024-04-14 DIAGNOSIS — M545 Low back pain, unspecified: Secondary | ICD-10-CM | POA: Diagnosis not present

## 2024-04-14 DIAGNOSIS — G8929 Other chronic pain: Secondary | ICD-10-CM | POA: Diagnosis not present

## 2024-04-14 DIAGNOSIS — I10 Essential (primary) hypertension: Secondary | ICD-10-CM | POA: Diagnosis not present

## 2024-04-14 DIAGNOSIS — M160 Bilateral primary osteoarthritis of hip: Secondary | ICD-10-CM | POA: Diagnosis not present

## 2024-04-14 DIAGNOSIS — Z9181 History of falling: Secondary | ICD-10-CM | POA: Diagnosis not present

## 2024-04-14 DIAGNOSIS — Z5982 Transportation insecurity: Secondary | ICD-10-CM | POA: Diagnosis not present

## 2024-04-14 DIAGNOSIS — Z791 Long term (current) use of non-steroidal anti-inflammatories (NSAID): Secondary | ICD-10-CM | POA: Diagnosis not present

## 2024-04-14 DIAGNOSIS — M19042 Primary osteoarthritis, left hand: Secondary | ICD-10-CM | POA: Diagnosis not present

## 2024-04-14 DIAGNOSIS — E119 Type 2 diabetes mellitus without complications: Secondary | ICD-10-CM | POA: Diagnosis not present

## 2024-04-14 DIAGNOSIS — F319 Bipolar disorder, unspecified: Secondary | ICD-10-CM | POA: Diagnosis not present

## 2024-04-14 DIAGNOSIS — Z7984 Long term (current) use of oral hypoglycemic drugs: Secondary | ICD-10-CM | POA: Diagnosis not present

## 2024-04-14 DIAGNOSIS — K219 Gastro-esophageal reflux disease without esophagitis: Secondary | ICD-10-CM | POA: Diagnosis not present

## 2024-04-14 DIAGNOSIS — M19041 Primary osteoarthritis, right hand: Secondary | ICD-10-CM | POA: Diagnosis not present

## 2024-04-14 DIAGNOSIS — Z79891 Long term (current) use of opiate analgesic: Secondary | ICD-10-CM | POA: Diagnosis not present

## 2024-04-14 DIAGNOSIS — F411 Generalized anxiety disorder: Secondary | ICD-10-CM | POA: Diagnosis not present

## 2024-04-14 NOTE — Telephone Encounter (Signed)
 Application placed in Dr. McDiarmids box for signature.

## 2024-04-16 ENCOUNTER — Ambulatory Visit

## 2024-04-21 NOTE — Telephone Encounter (Signed)
Faxed refills to [facility].

## 2024-04-28 DIAGNOSIS — Z7984 Long term (current) use of oral hypoglycemic drugs: Secondary | ICD-10-CM | POA: Diagnosis not present

## 2024-04-28 DIAGNOSIS — M160 Bilateral primary osteoarthritis of hip: Secondary | ICD-10-CM | POA: Diagnosis not present

## 2024-04-28 DIAGNOSIS — G8929 Other chronic pain: Secondary | ICD-10-CM | POA: Diagnosis not present

## 2024-04-28 DIAGNOSIS — M19041 Primary osteoarthritis, right hand: Secondary | ICD-10-CM | POA: Diagnosis not present

## 2024-04-28 DIAGNOSIS — F319 Bipolar disorder, unspecified: Secondary | ICD-10-CM | POA: Diagnosis not present

## 2024-04-28 DIAGNOSIS — K219 Gastro-esophageal reflux disease without esophagitis: Secondary | ICD-10-CM | POA: Diagnosis not present

## 2024-04-28 DIAGNOSIS — M1712 Unilateral primary osteoarthritis, left knee: Secondary | ICD-10-CM | POA: Diagnosis not present

## 2024-04-28 DIAGNOSIS — Z791 Long term (current) use of non-steroidal anti-inflammatories (NSAID): Secondary | ICD-10-CM | POA: Diagnosis not present

## 2024-04-28 DIAGNOSIS — E119 Type 2 diabetes mellitus without complications: Secondary | ICD-10-CM | POA: Diagnosis not present

## 2024-04-28 DIAGNOSIS — Z9181 History of falling: Secondary | ICD-10-CM | POA: Diagnosis not present

## 2024-04-28 DIAGNOSIS — Z7985 Long-term (current) use of injectable non-insulin antidiabetic drugs: Secondary | ICD-10-CM | POA: Diagnosis not present

## 2024-04-28 DIAGNOSIS — F411 Generalized anxiety disorder: Secondary | ICD-10-CM | POA: Diagnosis not present

## 2024-04-28 DIAGNOSIS — I10 Essential (primary) hypertension: Secondary | ICD-10-CM | POA: Diagnosis not present

## 2024-04-28 DIAGNOSIS — Z5982 Transportation insecurity: Secondary | ICD-10-CM | POA: Diagnosis not present

## 2024-04-28 DIAGNOSIS — M545 Low back pain, unspecified: Secondary | ICD-10-CM | POA: Diagnosis not present

## 2024-04-28 DIAGNOSIS — G4733 Obstructive sleep apnea (adult) (pediatric): Secondary | ICD-10-CM | POA: Diagnosis not present

## 2024-04-28 DIAGNOSIS — Z79891 Long term (current) use of opiate analgesic: Secondary | ICD-10-CM | POA: Diagnosis not present

## 2024-04-28 DIAGNOSIS — M19042 Primary osteoarthritis, left hand: Secondary | ICD-10-CM | POA: Diagnosis not present

## 2024-04-29 ENCOUNTER — Other Ambulatory Visit: Payer: Self-pay | Admitting: Family Medicine

## 2024-04-29 DIAGNOSIS — M545 Low back pain, unspecified: Secondary | ICD-10-CM

## 2024-04-30 ENCOUNTER — Telehealth: Payer: Self-pay | Admitting: *Deleted

## 2024-04-30 NOTE — Progress Notes (Signed)
 Complex Care Management Note  Care Guide Note 04/30/2024 Name: Amanda Larson MRN: [id number] DOB: 1965/09/18  Amanda Larson is a 58 y.o. year old female who sees Engineer, production, Raguel MATSU, DO for primary care. I reached out to Amanda Larson by phone today to offer complex care management services.  [name] was given information about Complex Care Management services today including:   The Complex Care Management services include support from the care team which includes your Nurse Care Manager, Clinical Social Worker, or Pharmacist.  The Complex Care Management team is here to help remove barriers to the health concerns and goals most important to you. Complex Care Management services are voluntary, and the patient may decline or stop services at any time by request to their care team member.   Complex Care Management Consent Status: Patient did not agree to participate in complex care management services at this time.  Follow up plan:  None  Encounter Outcome:  Patient Refused  Harlene Satterfield  [facility]  [facility], [facility] Guide  Direct Dial: [phone]  Fax [phone]

## 2024-05-01 ENCOUNTER — Telehealth: Payer: Self-pay

## 2024-05-01 DIAGNOSIS — M62838 Other muscle spasm: Secondary | ICD-10-CM

## 2024-05-01 MED ORDER — TIZANIDINE HCL 4 MG PO TABS: 4.0000 mg | ORAL_TABLET | Freq: Three times a day (TID) | ORAL | 0 refills | Status: DC | PRN

## 2024-05-01 MED ORDER — TRAMADOL HCL 50 MG PO TABS: 50.0000 mg | ORAL_TABLET | Freq: Every day | ORAL | 0 refills | Status: DC | PRN

## 2024-05-01 NOTE — Telephone Encounter (Signed)
 Patient calls nurse line in regards to Tizanidine  prescription.   She reports she called in for a refill, however this was denied by PCP.   She reports she takes (1) in the AM and (1) in the PM. She reports the medication has really been working well for her.   Advised will forward to PCP and provider who saw patient for original prescription.

## 2024-05-04 DIAGNOSIS — E119 Type 2 diabetes mellitus without complications: Secondary | ICD-10-CM | POA: Diagnosis not present

## 2024-05-04 DIAGNOSIS — M19042 Primary osteoarthritis, left hand: Secondary | ICD-10-CM | POA: Diagnosis not present

## 2024-05-04 DIAGNOSIS — Z7985 Long-term (current) use of injectable non-insulin antidiabetic drugs: Secondary | ICD-10-CM | POA: Diagnosis not present

## 2024-05-04 DIAGNOSIS — Z79891 Long term (current) use of opiate analgesic: Secondary | ICD-10-CM | POA: Diagnosis not present

## 2024-05-04 DIAGNOSIS — F411 Generalized anxiety disorder: Secondary | ICD-10-CM | POA: Diagnosis not present

## 2024-05-04 DIAGNOSIS — M545 Low back pain, unspecified: Secondary | ICD-10-CM | POA: Diagnosis not present

## 2024-05-04 DIAGNOSIS — K219 Gastro-esophageal reflux disease without esophagitis: Secondary | ICD-10-CM | POA: Diagnosis not present

## 2024-05-04 DIAGNOSIS — Z5982 Transportation insecurity: Secondary | ICD-10-CM | POA: Diagnosis not present

## 2024-05-04 DIAGNOSIS — I10 Essential (primary) hypertension: Secondary | ICD-10-CM | POA: Diagnosis not present

## 2024-05-04 DIAGNOSIS — G8929 Other chronic pain: Secondary | ICD-10-CM | POA: Diagnosis not present

## 2024-05-04 DIAGNOSIS — M160 Bilateral primary osteoarthritis of hip: Secondary | ICD-10-CM | POA: Diagnosis not present

## 2024-05-04 DIAGNOSIS — Z791 Long term (current) use of non-steroidal anti-inflammatories (NSAID): Secondary | ICD-10-CM | POA: Diagnosis not present

## 2024-05-04 DIAGNOSIS — M19041 Primary osteoarthritis, right hand: Secondary | ICD-10-CM | POA: Diagnosis not present

## 2024-05-04 DIAGNOSIS — Z9181 History of falling: Secondary | ICD-10-CM | POA: Diagnosis not present

## 2024-05-04 DIAGNOSIS — Z7984 Long term (current) use of oral hypoglycemic drugs: Secondary | ICD-10-CM | POA: Diagnosis not present

## 2024-05-04 DIAGNOSIS — G4733 Obstructive sleep apnea (adult) (pediatric): Secondary | ICD-10-CM | POA: Diagnosis not present

## 2024-05-04 DIAGNOSIS — M1712 Unilateral primary osteoarthritis, left knee: Secondary | ICD-10-CM | POA: Diagnosis not present

## 2024-05-04 DIAGNOSIS — F319 Bipolar disorder, unspecified: Secondary | ICD-10-CM | POA: Diagnosis not present

## 2024-05-09 ENCOUNTER — Other Ambulatory Visit: Payer: Self-pay | Admitting: Family Medicine

## 2024-05-09 DIAGNOSIS — F411 Generalized anxiety disorder: Secondary | ICD-10-CM

## 2024-05-15 DIAGNOSIS — Z791 Long term (current) use of non-steroidal anti-inflammatories (NSAID): Secondary | ICD-10-CM | POA: Diagnosis not present

## 2024-05-15 DIAGNOSIS — Z79891 Long term (current) use of opiate analgesic: Secondary | ICD-10-CM | POA: Diagnosis not present

## 2024-05-15 DIAGNOSIS — G4733 Obstructive sleep apnea (adult) (pediatric): Secondary | ICD-10-CM | POA: Diagnosis not present

## 2024-05-15 DIAGNOSIS — K219 Gastro-esophageal reflux disease without esophagitis: Secondary | ICD-10-CM | POA: Diagnosis not present

## 2024-05-15 DIAGNOSIS — M545 Low back pain, unspecified: Secondary | ICD-10-CM | POA: Diagnosis not present

## 2024-05-15 DIAGNOSIS — M19041 Primary osteoarthritis, right hand: Secondary | ICD-10-CM | POA: Diagnosis not present

## 2024-05-15 DIAGNOSIS — Z9181 History of falling: Secondary | ICD-10-CM | POA: Diagnosis not present

## 2024-05-15 DIAGNOSIS — E119 Type 2 diabetes mellitus without complications: Secondary | ICD-10-CM | POA: Diagnosis not present

## 2024-05-15 DIAGNOSIS — Z5982 Transportation insecurity: Secondary | ICD-10-CM | POA: Diagnosis not present

## 2024-05-15 DIAGNOSIS — F411 Generalized anxiety disorder: Secondary | ICD-10-CM | POA: Diagnosis not present

## 2024-05-15 DIAGNOSIS — Z7985 Long-term (current) use of injectable non-insulin antidiabetic drugs: Secondary | ICD-10-CM | POA: Diagnosis not present

## 2024-05-15 DIAGNOSIS — F319 Bipolar disorder, unspecified: Secondary | ICD-10-CM | POA: Diagnosis not present

## 2024-05-15 DIAGNOSIS — M19042 Primary osteoarthritis, left hand: Secondary | ICD-10-CM | POA: Diagnosis not present

## 2024-05-15 DIAGNOSIS — G8929 Other chronic pain: Secondary | ICD-10-CM | POA: Diagnosis not present

## 2024-05-15 DIAGNOSIS — M1712 Unilateral primary osteoarthritis, left knee: Secondary | ICD-10-CM | POA: Diagnosis not present

## 2024-05-15 DIAGNOSIS — I10 Essential (primary) hypertension: Secondary | ICD-10-CM | POA: Diagnosis not present

## 2024-05-15 DIAGNOSIS — Z7984 Long term (current) use of oral hypoglycemic drugs: Secondary | ICD-10-CM | POA: Diagnosis not present

## 2024-05-15 DIAGNOSIS — M160 Bilateral primary osteoarthritis of hip: Secondary | ICD-10-CM | POA: Diagnosis not present

## 2024-05-18 ENCOUNTER — Other Ambulatory Visit: Payer: Self-pay

## 2024-05-18 DIAGNOSIS — M62838 Other muscle spasm: Secondary | ICD-10-CM

## 2024-05-19 DIAGNOSIS — K219 Gastro-esophageal reflux disease without esophagitis: Secondary | ICD-10-CM | POA: Diagnosis not present

## 2024-05-19 DIAGNOSIS — G4733 Obstructive sleep apnea (adult) (pediatric): Secondary | ICD-10-CM | POA: Diagnosis not present

## 2024-05-19 DIAGNOSIS — Z7984 Long term (current) use of oral hypoglycemic drugs: Secondary | ICD-10-CM | POA: Diagnosis not present

## 2024-05-19 DIAGNOSIS — M19041 Primary osteoarthritis, right hand: Secondary | ICD-10-CM | POA: Diagnosis not present

## 2024-05-19 DIAGNOSIS — Z79891 Long term (current) use of opiate analgesic: Secondary | ICD-10-CM | POA: Diagnosis not present

## 2024-05-19 DIAGNOSIS — I10 Essential (primary) hypertension: Secondary | ICD-10-CM | POA: Diagnosis not present

## 2024-05-19 DIAGNOSIS — Z791 Long term (current) use of non-steroidal anti-inflammatories (NSAID): Secondary | ICD-10-CM | POA: Diagnosis not present

## 2024-05-19 DIAGNOSIS — Z9181 History of falling: Secondary | ICD-10-CM | POA: Diagnosis not present

## 2024-05-19 DIAGNOSIS — Z7985 Long-term (current) use of injectable non-insulin antidiabetic drugs: Secondary | ICD-10-CM | POA: Diagnosis not present

## 2024-05-19 DIAGNOSIS — M1712 Unilateral primary osteoarthritis, left knee: Secondary | ICD-10-CM | POA: Diagnosis not present

## 2024-05-19 DIAGNOSIS — G8929 Other chronic pain: Secondary | ICD-10-CM | POA: Diagnosis not present

## 2024-05-19 DIAGNOSIS — F411 Generalized anxiety disorder: Secondary | ICD-10-CM | POA: Diagnosis not present

## 2024-05-19 DIAGNOSIS — E119 Type 2 diabetes mellitus without complications: Secondary | ICD-10-CM | POA: Diagnosis not present

## 2024-05-19 DIAGNOSIS — M19042 Primary osteoarthritis, left hand: Secondary | ICD-10-CM | POA: Diagnosis not present

## 2024-05-19 DIAGNOSIS — F319 Bipolar disorder, unspecified: Secondary | ICD-10-CM | POA: Diagnosis not present

## 2024-05-19 DIAGNOSIS — M160 Bilateral primary osteoarthritis of hip: Secondary | ICD-10-CM | POA: Diagnosis not present

## 2024-05-19 DIAGNOSIS — Z5982 Transportation insecurity: Secondary | ICD-10-CM | POA: Diagnosis not present

## 2024-05-19 DIAGNOSIS — M545 Low back pain, unspecified: Secondary | ICD-10-CM | POA: Diagnosis not present

## 2024-05-22 ENCOUNTER — Ambulatory Visit

## 2024-05-26 DIAGNOSIS — Z79891 Long term (current) use of opiate analgesic: Secondary | ICD-10-CM | POA: Diagnosis not present

## 2024-05-31 ENCOUNTER — Other Ambulatory Visit: Payer: Self-pay

## 2024-05-31 DIAGNOSIS — M62838 Other muscle spasm: Secondary | ICD-10-CM

## 2024-06-01 ENCOUNTER — Other Ambulatory Visit: Payer: Self-pay | Admitting: Family Medicine

## 2024-06-01 DIAGNOSIS — M545 Low back pain, unspecified: Secondary | ICD-10-CM

## 2024-06-01 DIAGNOSIS — M5442 Lumbago with sciatica, left side: Secondary | ICD-10-CM

## 2024-06-04 ENCOUNTER — Other Ambulatory Visit: Payer: Self-pay

## 2024-06-04 DIAGNOSIS — M19042 Primary osteoarthritis, left hand: Secondary | ICD-10-CM | POA: Diagnosis not present

## 2024-06-04 MED ORDER — PREGABALIN 100 MG PO CAPS: 100.0000 mg | ORAL_CAPSULE | Freq: Two times a day (BID) | ORAL | 0 refills | Status: DC

## 2024-06-04 NOTE — Telephone Encounter (Signed)
 PDMP reviewed with Alan Flies, MD.

## 2024-06-05 ENCOUNTER — Other Ambulatory Visit: Payer: Self-pay | Admitting: Family Medicine

## 2024-06-05 ENCOUNTER — Other Ambulatory Visit (HOSPITAL_COMMUNITY): Payer: Self-pay

## 2024-06-05 ENCOUNTER — Telehealth: Payer: Self-pay

## 2024-06-05 DIAGNOSIS — G8929 Other chronic pain: Secondary | ICD-10-CM

## 2024-06-05 DIAGNOSIS — M5442 Lumbago with sciatica, left side: Secondary | ICD-10-CM

## 2024-06-05 NOTE — Telephone Encounter (Signed)
 Rec'd a PA request for patients Pregabalin  RX.   Per test claim, prescriber DEA not found/authorized. An attending may need to resend the RX. No PA needed.

## 2024-06-07 ENCOUNTER — Other Ambulatory Visit: Payer: Self-pay

## 2024-06-07 DIAGNOSIS — F411 Generalized anxiety disorder: Secondary | ICD-10-CM

## 2024-06-09 ENCOUNTER — Telehealth: Payer: Self-pay

## 2024-06-09 NOTE — Progress Notes (Signed)
 Amanda Larson                                          MRN: [id number]   06/09/2024   The VBCI Quality Team Specialist reviewed this patient medical record for the purposes of chart review for care gap closure. The following were reviewed: chart review for care gap closure-glycemic status assessment.    VBCI Quality Team

## 2024-06-09 NOTE — Telephone Encounter (Signed)
 4 boxes of Ozempic  2mg  dose pens arrived 06/05/24.

## 2024-06-10 ENCOUNTER — Other Ambulatory Visit (HOSPITAL_COMMUNITY): Payer: Self-pay

## 2024-06-11 DIAGNOSIS — Z791 Long term (current) use of non-steroidal anti-inflammatories (NSAID): Secondary | ICD-10-CM | POA: Diagnosis not present

## 2024-06-11 DIAGNOSIS — M160 Bilateral primary osteoarthritis of hip: Secondary | ICD-10-CM | POA: Diagnosis not present

## 2024-06-11 DIAGNOSIS — F319 Bipolar disorder, unspecified: Secondary | ICD-10-CM | POA: Diagnosis not present

## 2024-06-11 DIAGNOSIS — M545 Low back pain, unspecified: Secondary | ICD-10-CM | POA: Diagnosis not present

## 2024-06-11 DIAGNOSIS — G4733 Obstructive sleep apnea (adult) (pediatric): Secondary | ICD-10-CM | POA: Diagnosis not present

## 2024-06-11 DIAGNOSIS — K219 Gastro-esophageal reflux disease without esophagitis: Secondary | ICD-10-CM | POA: Diagnosis not present

## 2024-06-11 DIAGNOSIS — M19042 Primary osteoarthritis, left hand: Secondary | ICD-10-CM | POA: Diagnosis not present

## 2024-06-11 DIAGNOSIS — Z9181 History of falling: Secondary | ICD-10-CM | POA: Diagnosis not present

## 2024-06-11 DIAGNOSIS — Z79891 Long term (current) use of opiate analgesic: Secondary | ICD-10-CM | POA: Diagnosis not present

## 2024-06-11 DIAGNOSIS — E119 Type 2 diabetes mellitus without complications: Secondary | ICD-10-CM | POA: Diagnosis not present

## 2024-06-11 DIAGNOSIS — G8929 Other chronic pain: Secondary | ICD-10-CM | POA: Diagnosis not present

## 2024-06-11 DIAGNOSIS — F411 Generalized anxiety disorder: Secondary | ICD-10-CM | POA: Diagnosis not present

## 2024-06-11 DIAGNOSIS — Z5982 Transportation insecurity: Secondary | ICD-10-CM | POA: Diagnosis not present

## 2024-06-11 DIAGNOSIS — M1712 Unilateral primary osteoarthritis, left knee: Secondary | ICD-10-CM | POA: Diagnosis not present

## 2024-06-11 DIAGNOSIS — Z7984 Long term (current) use of oral hypoglycemic drugs: Secondary | ICD-10-CM | POA: Diagnosis not present

## 2024-06-11 DIAGNOSIS — Z7985 Long-term (current) use of injectable non-insulin antidiabetic drugs: Secondary | ICD-10-CM | POA: Diagnosis not present

## 2024-06-11 DIAGNOSIS — I10 Essential (primary) hypertension: Secondary | ICD-10-CM | POA: Diagnosis not present

## 2024-06-12 ENCOUNTER — Telehealth: Payer: Self-pay

## 2024-06-12 MED ORDER — PREGABALIN 100 MG PO CAPS: 100.0000 mg | ORAL_CAPSULE | Freq: Two times a day (BID) | ORAL | 0 refills | Status: AC

## 2024-06-18 ENCOUNTER — Other Ambulatory Visit: Payer: Self-pay

## 2024-06-18 DIAGNOSIS — M62838 Other muscle spasm: Secondary | ICD-10-CM

## 2024-06-19 ENCOUNTER — Ambulatory Visit

## 2024-06-19 DIAGNOSIS — G8929 Other chronic pain: Secondary | ICD-10-CM | POA: Diagnosis not present

## 2024-06-19 DIAGNOSIS — Z7985 Long-term (current) use of injectable non-insulin antidiabetic drugs: Secondary | ICD-10-CM | POA: Diagnosis not present

## 2024-06-19 DIAGNOSIS — M19041 Primary osteoarthritis, right hand: Secondary | ICD-10-CM | POA: Diagnosis not present

## 2024-06-19 DIAGNOSIS — Z5982 Transportation insecurity: Secondary | ICD-10-CM | POA: Diagnosis not present

## 2024-06-19 DIAGNOSIS — M19042 Primary osteoarthritis, left hand: Secondary | ICD-10-CM | POA: Diagnosis not present

## 2024-06-19 DIAGNOSIS — M545 Low back pain, unspecified: Secondary | ICD-10-CM | POA: Diagnosis not present

## 2024-06-19 DIAGNOSIS — K219 Gastro-esophageal reflux disease without esophagitis: Secondary | ICD-10-CM | POA: Diagnosis not present

## 2024-06-19 DIAGNOSIS — Z7984 Long term (current) use of oral hypoglycemic drugs: Secondary | ICD-10-CM | POA: Diagnosis not present

## 2024-06-19 DIAGNOSIS — Z791 Long term (current) use of non-steroidal anti-inflammatories (NSAID): Secondary | ICD-10-CM | POA: Diagnosis not present

## 2024-06-19 DIAGNOSIS — I10 Essential (primary) hypertension: Secondary | ICD-10-CM | POA: Diagnosis not present

## 2024-06-19 DIAGNOSIS — M160 Bilateral primary osteoarthritis of hip: Secondary | ICD-10-CM | POA: Diagnosis not present

## 2024-06-19 DIAGNOSIS — M542 Cervicalgia: Secondary | ICD-10-CM | POA: Diagnosis not present

## 2024-06-19 DIAGNOSIS — F319 Bipolar disorder, unspecified: Secondary | ICD-10-CM | POA: Diagnosis not present

## 2024-06-19 DIAGNOSIS — F411 Generalized anxiety disorder: Secondary | ICD-10-CM | POA: Diagnosis not present

## 2024-06-19 DIAGNOSIS — E1149 Type 2 diabetes mellitus with other diabetic neurological complication: Secondary | ICD-10-CM | POA: Diagnosis not present

## 2024-06-19 DIAGNOSIS — G4733 Obstructive sleep apnea (adult) (pediatric): Secondary | ICD-10-CM | POA: Diagnosis not present

## 2024-06-19 DIAGNOSIS — Z9181 History of falling: Secondary | ICD-10-CM | POA: Diagnosis not present

## 2024-06-19 DIAGNOSIS — M1712 Unilateral primary osteoarthritis, left knee: Secondary | ICD-10-CM | POA: Diagnosis not present

## 2024-06-22 ENCOUNTER — Other Ambulatory Visit: Payer: Self-pay

## 2024-06-22 DIAGNOSIS — F319 Bipolar disorder, unspecified: Secondary | ICD-10-CM

## 2024-06-22 DIAGNOSIS — E785 Hyperlipidemia, unspecified: Secondary | ICD-10-CM

## 2024-06-22 DIAGNOSIS — E119 Type 2 diabetes mellitus without complications: Secondary | ICD-10-CM

## 2024-06-23 ENCOUNTER — Emergency Department (HOSPITAL_COMMUNITY)

## 2024-06-23 ENCOUNTER — Other Ambulatory Visit: Payer: Self-pay

## 2024-06-23 ENCOUNTER — Encounter (HOSPITAL_COMMUNITY): Payer: Self-pay

## 2024-06-23 ENCOUNTER — Emergency Department (HOSPITAL_COMMUNITY): Admission: EM | Admit: 2024-06-23 | Discharge: 2024-06-23 | Disposition: A | Discharge status: Home / Self Care

## 2024-06-23 DIAGNOSIS — M549 Dorsalgia, unspecified: Secondary | ICD-10-CM | POA: Diagnosis not present

## 2024-06-23 DIAGNOSIS — N132 Hydronephrosis with renal and ureteral calculous obstruction: Secondary | ICD-10-CM | POA: Diagnosis not present

## 2024-06-23 DIAGNOSIS — N200 Calculus of kidney: Secondary | ICD-10-CM

## 2024-06-23 DIAGNOSIS — I10 Essential (primary) hypertension: Secondary | ICD-10-CM | POA: Diagnosis not present

## 2024-06-23 DIAGNOSIS — R9431 Abnormal electrocardiogram [ECG] [EKG]: Secondary | ICD-10-CM | POA: Diagnosis not present

## 2024-06-23 DIAGNOSIS — R109 Unspecified abdominal pain: Secondary | ICD-10-CM | POA: Diagnosis present

## 2024-06-23 DIAGNOSIS — R1906 Epigastric swelling, mass or lump: Secondary | ICD-10-CM | POA: Insufficient documentation

## 2024-06-23 DIAGNOSIS — R1013 Epigastric pain: Secondary | ICD-10-CM | POA: Diagnosis not present

## 2024-06-23 DIAGNOSIS — Z743 Need for continuous supervision: Secondary | ICD-10-CM | POA: Diagnosis not present

## 2024-06-23 DIAGNOSIS — K449 Diaphragmatic hernia without obstruction or gangrene: Secondary | ICD-10-CM | POA: Diagnosis not present

## 2024-06-23 LAB — COMPREHENSIVE METABOLIC PANEL WITH GFR
ALT: 14 U/L (ref 0–44)
AST: 20 U/L (ref 15–41)
Albumin: 3.7 g/dL (ref 3.5–5.0)
Alkaline Phosphatase: 77 U/L (ref 38–126)
Anion gap: 14 (ref 5–15)
BUN: 16 mg/dL (ref 6–20)
CO2: 23 mmol/L (ref 22–32)
Calcium: 9.5 mg/dL (ref 8.9–10.3)
Chloride: 101 mmol/L (ref 98–111)
Creatinine, Ser: 0.92 mg/dL (ref 0.44–1.00)
GFR, Estimated: >60 mL/min (ref >60)
Glucose, Bld: 195 mg/dL — ABNORMAL HIGH (ref 70–99)
Potassium: 4.9 mmol/L (ref 3.5–5.1)
Sodium: 138 mmol/L (ref 135–145)
Total Bilirubin: 0.8 mg/dL (ref 0.0–1.2)
Total Protein: 7.8 g/dL (ref 6.5–8.1)

## 2024-06-23 LAB — URINALYSIS, ROUTINE W REFLEX MICROSCOPIC
Bilirubin Urine: NEGATIVE
Glucose, UA: NEGATIVE mg/dL
Ketones, ur: NEGATIVE mg/dL
Leukocytes,Ua: NEGATIVE
Nitrite: NEGATIVE
Protein, ur: NEGATIVE mg/dL
RBC / HPF: >50 RBC/hpf (ref 0–5)
Specific Gravity, Urine: >1.046 — ABNORMAL HIGH (ref 1.005–1.030)
pH: 6 (ref 5.0–8.0)

## 2024-06-23 LAB — CBC
HCT: 42.4 % (ref 36.0–46.0)
Hemoglobin: 13.1 g/dL (ref 12.0–15.0)
MCH: 24.4 pg — ABNORMAL LOW (ref 26.0–34.0)
MCHC: 30.9 g/dL (ref 30.0–36.0)
MCV: 79 fL — ABNORMAL LOW (ref 80.0–100.0)
Platelets: 254 K/uL (ref 150–400)
RBC: 5.37 MIL/uL — ABNORMAL HIGH (ref 3.87–5.11)
RDW: 16.7 % — ABNORMAL HIGH (ref 11.5–15.5)
WBC: 9.1 K/uL (ref 4.0–10.5)
nRBC: 0 % (ref 0.0–0.2)

## 2024-06-23 LAB — LIPASE, BLOOD: Lipase: 43 U/L (ref 11–51)

## 2024-06-23 MED ORDER — IOHEXOL 350 MG/ML SOLN
75.0000 mL | Freq: Once | INTRAVENOUS | Status: AC | PRN
  Administered 2024-06-23: 75 mL via INTRAVENOUS

## 2024-06-23 MED ORDER — KETOROLAC TROMETHAMINE 15 MG/ML IJ SOLN
15.0000 mg | Freq: Once | INTRAMUSCULAR | Status: AC
  Administered 2024-06-23: 15 mg via INTRAVENOUS
  Filled 2024-06-23: qty 1

## 2024-06-23 MED ORDER — OXYCODONE-ACETAMINOPHEN 5-325 MG PO TABS
1.0000 | ORAL_TABLET | Freq: Once | ORAL | Status: AC
  Administered 2024-06-23: 1 via ORAL
  Filled 2024-06-23: qty 1

## 2024-06-23 MED ORDER — SODIUM CHLORIDE 0.9 % IV BOLUS
500.0000 mL | Freq: Once | INTRAVENOUS | Status: AC
  Administered 2024-06-23: 500 mL via INTRAVENOUS

## 2024-06-23 MED ORDER — TAMSULOSIN HCL 0.4 MG PO CAPS: 0.4000 mg | ORAL_CAPSULE | Freq: Every day | ORAL | 0 refills | Status: AC

## 2024-06-23 MED ORDER — HYDROMORPHONE HCL 1 MG/ML IJ SOLN: 0.5000 mg | Freq: Once | INTRAMUSCULAR | Status: DC

## 2024-06-23 MED ORDER — HYDROMORPHONE HCL 1 MG/ML IJ SOLN
0.5000 mg | Freq: Once | INTRAMUSCULAR | Status: AC
  Administered 2024-06-23: 0.5 mg via INTRAVENOUS
  Filled 2024-06-23: qty 1

## 2024-06-23 MED ORDER — ONDANSETRON HCL 4 MG PO TABS: 4.0000 mg | ORAL_TABLET | Freq: Three times a day (TID) | ORAL | 0 refills | Status: AC | PRN

## 2024-06-23 MED ORDER — ONDANSETRON 4 MG PO TBDP
4.0000 mg | ORAL_TABLET | Freq: Once | ORAL | Status: AC | PRN
  Administered 2024-06-23: 4 mg via ORAL
  Filled 2024-06-23: qty 1

## 2024-06-23 NOTE — Discharge Instructions (Signed)
 Follow-up with primary care and urology as needed.  You should pass your kidney stone in a few days.  Take your Flomax as prescribed, and you can use your Zofran as needed for nausea and vomiting.  Alternate Tylenol  Motrin every 3 hours throughout the day as needed for pain.  Make sure you are drinking lots of water.  Return to the ER for new or worsening symptoms.  You were also found to have a mass near your esophagus, the CT scan cannot tell if it is a mass or a hernia.  GI was consulted and they will call you to try to schedule an appointment for an endoscopy.

## 2024-06-23 NOTE — ED Notes (Signed)
 PT aware a urine sample is needed.  Breathing is even and unlabored.

## 2024-06-23 NOTE — ED Notes (Signed)
 Rounded on pt breathing is even and unlabored. Call light within reach, encouraged to use when needs arise. Support person at bedside. PT rating pain 7/10.

## 2024-06-23 NOTE — ED Provider Triage Note (Signed)
 Emergency Medicine Provider Triage Evaluation Note  Amanda Larson , a 58 y.o. female  was evaluated in triage.  Pt complains of back pain/abdominal pain.  Patient reports that she woke up this morning with severe right lower back pain/flank pain with pain rating towards the right lower quadrant.  She has had some vomiting.  No diarrhea.  Denies any numbness, tingling, bowel or bladder incontinence.  No reported dysuria or hematuria as far she can notice.  Review of Systems  Positive: As above Negative: As above  Physical Exam  BP (!) 168/100 (BP Location: Right Arm)   Pulse 72   Temp 97.9 F (36.6 C) (Oral)   Resp 20   Ht 5' 1 (1.549 m)   Wt 135.2 kg   LMP 01/17/2014   SpO2 97%   BMI 56.31 kg/m  Gen:   Awake, no distress   Resp:  Normal effort  MSK:   Moves extremities without difficulty  Other:  Right CVA tenderness with radiating symptoms towards the right lower quadrant.  Medical Decision Making  Medically screening exam initiated at 8:00 AM.  Appropriate orders placed.  Amanda Larson was informed that the remainder of the evaluation will be completed by another provider, this initial triage assessment does not replace that evaluation, and the importance of remaining in the ED until their evaluation is complete.     Kashtyn Jankowski A, PA-C 06/23/24 [phone]

## 2024-06-23 NOTE — ED Notes (Signed)
 Patient is unable to void at this time. She stated she will let us  know when she has to go again. Aware we need a urinalysis.

## 2024-06-23 NOTE — ED Provider Notes (Signed)
 [place] EMERGENCY DEPARTMENT AT [facility] Provider Note   CSN: [id number] Arrival date & time: 06/23/24  9265     Patient presents with: Back Pain and Abdominal Pain   Amanda Larson is a 58 y.o. female.   58 year old female presents for evaluation of abdominal pain and back pain. States it woke her up out of sleep. States it located primarily on her right side in the back and radiates to the right abdomen. She states she has had her gallbladder removed before. No history of kidney stones and she denies urinary symptoms. Admits to nausea as well today, no vomiting or diarrhea.    Back Pain Associated symptoms: abdominal pain   Associated symptoms: no chest pain, no dysuria and no fever   Abdominal Pain Associated symptoms: nausea   Associated symptoms: no chest pain, no chills, no cough, no dysuria, no fever, no hematuria, no shortness of breath, no sore throat and no vomiting        Prior to Admission medications   Medication Sig Start Date End Date Taking? Authorizing Provider  ondansetron (ZOFRAN) 4 MG tablet Take 1 tablet (4 mg total) by mouth every 8 (eight) hours as needed for up to 4 days. 06/23/24 06/27/24 Yes Rosalia Mcavoy L, DO  tamsulosin (FLOMAX) 0.4 MG CAPS capsule Take 1 capsule (0.4 mg total) by mouth daily. 06/23/24  Yes Adaline Trejos L, DO  albuterol  (VENTOLIN  HFA) 108 (90 Base) MCG/ACT inhaler INHALE 2 PUFFS BY MOUTH EVERY 6 HOURS AS NEEDED FOR WHEEZING FOR SHORTNESS OF BREATH 09/12/23   Bryan Bianchi, MD  benzonatate  (TESSALON ) 100 MG capsule Take 1 capsule (100 mg total) by mouth 2 (two) times daily as needed for cough. Patient not taking: Reported on 11/22/2023 10/22/22   Bryan Bianchi, MD  budesonide -formoterol  (SYMBICORT ) 80-4.5 MCG/ACT inhaler Inhale 2 puffs into the lungs 2 (two) times daily. Patient not taking: Reported on 04/10/2024 06/13/23   Bryan Bianchi, MD  citalopram  (CELEXA ) 40 MG tablet Take 1 tablet (40 mg total) by mouth daily.  03/26/24   Baker, Raguel MATSU, DO  famotidine  (PEPCID ) 20 MG tablet Take 1 tablet (20 mg total) by mouth 2 (two) times daily. 04/10/24   Tharon Lung, MD  hydrOXYzine  (ATARAX ) 25 MG tablet TAKE 1 TABLET BY MOUTH THREE TIMES DAILY AS NEEDED FOR ANXIETY 06/10/24   Baker, Raguel MATSU, DO  Melatonin 5 MG CAPS Take 3 capsules by mouth at bedtime.    [provider]  meloxicam  (MOBIC ) 15 MG tablet Take 1 tablet by mouth once daily 06/05/24   Baker, Amelia G, DO  metFORMIN  (GLUCOPHAGE -XR) 500 MG 24 hr tablet TAKE 4 TABLETS BY MOUTH ONCE DAILY 03/26/24   Lennie, Amelia G, DO  Multiple Vitamins-Minerals (MULTIVITAMIN GUMMIES ADULT PO) Take 1 Dose by mouth daily.    [provider]  Omega-3 Fatty Acids (FISH OIL) 1000 MG CAPS Take by mouth.    [provider]  pregabalin  (LYRICA ) 100 MG capsule Take 1 capsule (100 mg total) by mouth 2 (two) times daily. 06/12/24   Pruett, Milda CROME, MD  Semaglutide , 2 MG/DOSE, (OZEMPIC , 2 MG/DOSE,) 8 MG/3ML SOPN Inject 2 mg into the skin once a week. 09/12/22   Bryan Bianchi, MD  simvastatin  (ZOCOR ) 40 MG tablet Take 1 tablet (40 mg total) by mouth at bedtime. 03/31/24   Baker, Raguel MATSU, DO  tiZANidine  (ZANAFLEX ) 4 MG tablet TAKE 1 TABLET BY MOUTH EVERY 8 HOURS AS NEEDED FOR MUSCLE SPASM 06/18/24  Baker, Raguel MATSU, DO  traMADol  (ULTRAM ) 50 MG tablet TAKE 1 TABLET BY MOUTH ONCE DAILY AS NEEDED 06/04/24   Lennie, Raguel MATSU, DO  traZODone  (DESYREL ) 50 MG tablet Take 1 tablet (50 mg total) by mouth at bedtime. 03/31/24   Lennie Raguel MATSU, DO    Allergies: Sulfa antibiotics, Biaxin [clarithromycin], and Clarithromycin    Review of Systems  Constitutional:  Negative for chills and fever.  HENT:  Negative for ear pain and sore throat.   Eyes:  Negative for pain and visual disturbance.  Respiratory:  Negative for cough and shortness of breath.   Cardiovascular:  Negative for chest pain and palpitations.  Gastrointestinal:  Positive for abdominal pain and nausea. Negative  for vomiting.  Genitourinary:  Negative for dysuria and hematuria.  Musculoskeletal:  Positive for back pain. Negative for arthralgias.  Skin:  Negative for color change and rash.  Neurological:  Negative for seizures and syncope.  All other systems reviewed and are negative.   Updated Vital Signs BP (!) 159/83   Pulse 63   Temp 97.8 F (36.6 C) (Oral)   Resp 18   Ht 5' 1 (1.549 m)   Wt 135.2 kg   LMP 01/17/2014   SpO2 100%   BMI 56.31 kg/m   Physical Exam Vitals and nursing note reviewed.  Constitutional:      General: She is not in acute distress.    Appearance: She is well-developed. She is obese. She is not ill-appearing.  HENT:     Head: Normocephalic and atraumatic.  Eyes:     Conjunctiva/sclera: Conjunctivae normal.  Cardiovascular:     Rate and Rhythm: Normal rate and regular rhythm.     Heart sounds: No murmur heard. Pulmonary:     Effort: Pulmonary effort is normal. No respiratory distress.     Breath sounds: Normal breath sounds.  Abdominal:     Palpations: Abdomen is soft.     Tenderness: There is abdominal tenderness in the right lower quadrant. There is right CVA tenderness.  Musculoskeletal:        General: No swelling.     Cervical back: Neck supple.  Skin:    General: Skin is warm and dry.     Capillary Refill: Capillary refill takes less than 2 seconds.  Neurological:     Mental Status: She is alert.  Psychiatric:        Mood and Affect: Mood normal.     (all labs ordered are listed, but only abnormal results are displayed) Labs Reviewed  COMPREHENSIVE METABOLIC PANEL WITH GFR - Abnormal; Notable for the following components:      Result Value   Glucose, Bld 195 (*)    All other components within normal limits  CBC - Abnormal; Notable for the following components:   RBC 5.37 (*)    MCV 79.0 (*)    MCH 24.4 (*)    RDW 16.7 (*)    All other components within normal limits  URINALYSIS, ROUTINE W REFLEX MICROSCOPIC - Abnormal; Notable for  the following components:   Specific Gravity, Urine >1.046 (*)    Hgb urine dipstick MODERATE (*)    Bacteria, UA RARE (*)    All other components within normal limits  LIPASE, BLOOD    EKG: EKG Interpretation Date/Time:  Tuesday June 23 2024 07:58:01 EDT Ventricular Rate:  70 PR Interval:  168 QRS Duration:  96 QT Interval:  384 QTC Calculation: 414 R Axis:   5  Text Interpretation: Normal  sinus rhythm Left ventricular hypertrophy Age indeterminate anterior infarct Compared with prior EKG from 08/12/2024 Confirmed by Gennaro Bouchard ([id number]) on 06/23/2024 8:02:21 AM  Radiology: CT ABDOMEN PELVIS W CONTRAST Result Date: 06/23/2024 CLINICAL DATA:  Nonlocalized abdominal and flank pain. EXAM: CT ABDOMEN AND PELVIS WITH CONTRAST TECHNIQUE: Multidetector CT imaging of the abdomen and pelvis was performed using the standard protocol following bolus administration of intravenous contrast. RADIATION DOSE REDUCTION: This exam was performed according to the departmental dose-optimization program which includes automated exposure control, adjustment of the mA and/or kV according to patient size and/or use of iterative reconstruction technique. CONTRAST:  75mL OMNIPAQUE  IOHEXOL  350 MG/ML SOLN COMPARISON:  11/20/2010 FINDINGS: Lower chest: 3.7 x 2.7 cm calcified soft tissue structure is identified adjacent to the distal esophagus. This may be a small hiatal hernia but the esophagus appears to pass posterior and to the left of the lesion. No gas in the structure to suggest diverticulum. This finding is new since chest CT of 04/09/2023. Hepatobiliary: No suspicious focal abnormality within the liver parenchyma. Small area of low attenuation in the anterior liver, adjacent to the falciform ligament, is in a characteristic location for focal fatty deposition/anomalous perfusion. Cholecystectomy. No intrahepatic or extrahepatic biliary dilation. Pancreas: No focal mass lesion. No dilatation of the main duct.  No intraparenchymal cyst. No peripancreatic edema. Spleen: No splenomegaly. No suspicious focal mass lesion. Adrenals/Urinary Tract: Right adrenal gland unremarkable. 12 mm left adrenal nodule on 18/2 cannot be definitively characterized. Left kidney and ureter unremarkable. Mild right hydronephrosis with adjacent tiny 2-3 mm stones in the lower pole right kidney. Right urinary obstruction is due to the presence of a 2 x 4 x 6 mm stone in the proximal right ureter (41/2 and well seen coronal 76/4). Mid and distal right ureter unremarkable. The urinary bladder appears normal for the degree of distention. Stomach/Bowel: Stomach is unremarkable. No gastric wall thickening. No evidence of outlet obstruction. Duodenum is normally positioned as is the ligament of Treitz. No small bowel wall thickening. No small bowel dilatation. The terminal ileum is normal. The appendix is normal. No gross colonic mass. No colonic wall thickening. Vascular/Lymphatic: No abdominal aortic aneurysm. No abdominal aortic atherosclerotic calcification. There is no gastrohepatic or hepatoduodenal ligament lymphadenopathy. No retroperitoneal or mesenteric lymphadenopathy. No pelvic sidewall lymphadenopathy. Reproductive: Unremarkable. Other: No intraperitoneal free fluid. Musculoskeletal: No worrisome lytic or sclerotic osseous abnormality. Advanced degenerative changes are noted in the left hip. IMPRESSION: 1. 3.7 x 2.7 cm calcified soft tissue structure adjacent to the distal esophagus. This may be a small hiatal hernia but the esophagus appears to pass posterior and to the left of the lesion raising concern for a soft tissue mass. No gas in the structure to suggest diverticulum. GI consultation recommended and follow-up upper endoscopy would likely prove helpful. 2. 2 x 4 x 6 mm proximal right ureteral stone with mild right hydronephrosis. 3. Additional tiny 2-3 mm stones in the lower pole right kidney. 4. 12 mm left adrenal nodule cannot be  definitively characterized. Statistically, this is likely benign. Follow-up adrenal protocol CT in 3 months could be used to ensure stability. Electronically Signed   By: Camellia Candle M.D.   On: 06/23/2024 09:23     Procedures   Medications Ordered in the ED  ondansetron (ZOFRAN-ODT) disintegrating tablet 4 mg (4 mg Oral Given 06/23/24 0748)  sodium chloride 0.9 % bolus 500 mL (0 mLs Intravenous Stopped 06/23/24 0930)  HYDROmorphone (DILAUDID) injection 0.5 mg (0.5 mg Intravenous Given 06/23/24  9140)  iohexol  (OMNIPAQUE ) 350 MG/ML injection 75 mL (75 mLs Intravenous Contrast Given 06/23/24 0858)  sodium chloride 0.9 % bolus 500 mL (0 mLs Intravenous Stopped 06/23/24 1115)  ketorolac  (TORADOL ) 15 MG/ML injection 15 mg (15 mg Intravenous Given 06/23/24 1046)  oxyCODONE-acetaminophen  (PERCOCET/ROXICET) 5-325 MG per tablet 1 tablet (1 tablet Oral Given 06/23/24 1135)                                    Medical Decision Making Cardiac monitor interpretation: Sinus rhythm, no ectopy  Patient here for flank and right lower quadrant abdominal pain.  Found to have 2 to 3 mm nonobstructing kidney stone in the right UVJ.  I think this is causing her symptoms.  Better after Dilaudid Toradol  fluids and Zofran.  Will give her prescription for Flomax and Zofran she is advised Tylenol  Motrin as needed for pain.  Of note she did have an abnormal mass found near her esophagus of unknown etiology.  I discussed this with GI, Dr. Kerman, and she will have the office call the patient to arrange follow-up for endoscopy.  This was all conveyed to the patient she is agreeable with the plan.  Advised to return for new or worsening symptoms.  Feels comfortable being discharged home.  Problems Addressed: Epigastric mass: undiagnosed new problem with uncertain prognosis Kidney stone: acute illness or injury  Amount and/or Complexity of Data Reviewed External Data Reviewed: notes.    Details: Prior outpatient  records reviewed and patient was supposed to follow-up with GI in the past, but had many no-show appointments Labs: ordered. Decision-making details documented in ED Course.    Details: Ordered and reviewed by me and unremarkable Radiology: ordered and independent interpretation performed. Decision-making details documented in ED Course.    Details: Ordered and reviewed by me CT abdomen pelvis: Shows ureterolithiasis about 2 to 3 mm on the right with no obstruction and abnormal gastric lab esophageal mass ECG/medicine tests: ordered and independent interpretation performed. Decision-making details documented in ED Course.    Details: Ordered and interpreted by me in the absence of cardiology and shows sinus rhythm, no STEMI or acute change when compared to prior Discussion of management or test interpretation with external provider(s): Dr. Kerman -GI-I spoke with her on the phone regarding the patient's case and she we will have the patient follow-up in the office.  The office will call the patient to schedule an appointment  Risk OTC drugs. Prescription drug management. Parenteral controlled substances. Drug therapy requiring intensive monitoring for toxicity.    Final diagnoses:  Kidney stone  Epigastric mass    ED Discharge Orders          Ordered    ondansetron (ZOFRAN) 4 MG tablet  Every 8 hours PRN        06/23/24 1112    tamsulosin (FLOMAX) 0.4 MG CAPS capsule  Daily        06/23/24 1112               Ginamarie Banfield, Duwaine L, DO 06/23/24 1325

## 2024-06-23 NOTE — ED Notes (Signed)
 Urine sent at this time. PT assisted to the restroom in a wheelchair.  Breathing is even and unlabored.  Call light within reach.

## 2024-06-23 NOTE — ED Triage Notes (Signed)
 Pt to ED via [facility] from home. Pt c/o back pain that woke her from sleep. Pain radiates around stomach and has had vomiting. Pt denies injury. Pt denies any numbness, tingling, bowel or bladder incontinence. Pt denies urinary symptoms. Pt states she tried to take a muscle relaxer but threw it up.   152/96 90 Cbg 188

## 2024-06-24 ENCOUNTER — Other Ambulatory Visit: Payer: Self-pay

## 2024-06-24 DIAGNOSIS — F319 Bipolar disorder, unspecified: Secondary | ICD-10-CM

## 2024-06-24 DIAGNOSIS — E119 Type 2 diabetes mellitus without complications: Secondary | ICD-10-CM

## 2024-06-24 DIAGNOSIS — F411 Generalized anxiety disorder: Secondary | ICD-10-CM

## 2024-06-25 ENCOUNTER — Telehealth: Payer: Self-pay | Admitting: Pharmacist

## 2024-06-25 NOTE — Telephone Encounter (Signed)
 Patient contacted for follow-up of missed PCP visit 10/10 and need for multiple DM QI metrics evaluations for 2025.   Scheduled for 11/13 with Dr. Lennie Suggest A1C, Lipid panel, BMET and UACR at that time.   Total time with patient call and documentation of interaction: 12 minutes.

## 2024-06-26 NOTE — Telephone Encounter (Signed)
 Reviewed and agree with Dr Rennis plan.

## 2024-06-30 DIAGNOSIS — M542 Cervicalgia: Secondary | ICD-10-CM | POA: Diagnosis not present

## 2024-06-30 DIAGNOSIS — M1712 Unilateral primary osteoarthritis, left knee: Secondary | ICD-10-CM | POA: Diagnosis not present

## 2024-06-30 DIAGNOSIS — K219 Gastro-esophageal reflux disease without esophagitis: Secondary | ICD-10-CM | POA: Diagnosis not present

## 2024-06-30 DIAGNOSIS — M19041 Primary osteoarthritis, right hand: Secondary | ICD-10-CM | POA: Diagnosis not present

## 2024-06-30 DIAGNOSIS — G8929 Other chronic pain: Secondary | ICD-10-CM | POA: Diagnosis not present

## 2024-06-30 DIAGNOSIS — E1149 Type 2 diabetes mellitus with other diabetic neurological complication: Secondary | ICD-10-CM | POA: Diagnosis not present

## 2024-06-30 DIAGNOSIS — M545 Low back pain, unspecified: Secondary | ICD-10-CM | POA: Diagnosis not present

## 2024-06-30 DIAGNOSIS — F411 Generalized anxiety disorder: Secondary | ICD-10-CM | POA: Diagnosis not present

## 2024-06-30 DIAGNOSIS — Z5982 Transportation insecurity: Secondary | ICD-10-CM | POA: Diagnosis not present

## 2024-06-30 DIAGNOSIS — Z7984 Long term (current) use of oral hypoglycemic drugs: Secondary | ICD-10-CM | POA: Diagnosis not present

## 2024-06-30 DIAGNOSIS — M19042 Primary osteoarthritis, left hand: Secondary | ICD-10-CM | POA: Diagnosis not present

## 2024-06-30 DIAGNOSIS — Z9181 History of falling: Secondary | ICD-10-CM | POA: Diagnosis not present

## 2024-06-30 DIAGNOSIS — F319 Bipolar disorder, unspecified: Secondary | ICD-10-CM | POA: Diagnosis not present

## 2024-06-30 DIAGNOSIS — Z7985 Long-term (current) use of injectable non-insulin antidiabetic drugs: Secondary | ICD-10-CM | POA: Diagnosis not present

## 2024-06-30 DIAGNOSIS — G4733 Obstructive sleep apnea (adult) (pediatric): Secondary | ICD-10-CM | POA: Diagnosis not present

## 2024-06-30 DIAGNOSIS — I10 Essential (primary) hypertension: Secondary | ICD-10-CM | POA: Diagnosis not present

## 2024-06-30 DIAGNOSIS — Z791 Long term (current) use of non-steroidal anti-inflammatories (NSAID): Secondary | ICD-10-CM | POA: Diagnosis not present

## 2024-07-01 ENCOUNTER — Other Ambulatory Visit: Payer: Self-pay

## 2024-07-01 ENCOUNTER — Encounter: Payer: Self-pay | Admitting: Gastroenterology

## 2024-07-01 DIAGNOSIS — M545 Low back pain, unspecified: Secondary | ICD-10-CM

## 2024-07-01 NOTE — Telephone Encounter (Signed)
 Reviewed PDMP with Dr. Theophilus.

## 2024-07-01 NOTE — Telephone Encounter (Signed)
 Spoke with patient regarding pickup and 2026 changes with [facility].

## 2024-07-02 ENCOUNTER — Ambulatory Visit: Admitting: Gastroenterology

## 2024-07-03 ENCOUNTER — Other Ambulatory Visit: Payer: Self-pay

## 2024-07-03 DIAGNOSIS — M62838 Other muscle spasm: Secondary | ICD-10-CM

## 2024-07-03 DIAGNOSIS — G8929 Other chronic pain: Secondary | ICD-10-CM

## 2024-07-03 NOTE — Progress Notes (Signed)
 ZAHARI FAZZINO                                          MRN: [id number]   07/03/2024   The VBCI Quality Team Specialist reviewed this patient medical record for the purposes of chart review for care gap closure. The following were reviewed: chart review for care gap closure-kidney health evaluation for diabetes:eGFR  and uACR.    VBCI Quality Team

## 2024-07-03 NOTE — Progress Notes (Signed)
 Amanda Larson                                          MRN: [id number]   07/03/2024   The VBCI Quality Team Specialist reviewed this patient medical record for the purposes of chart review for care gap closure. The following were reviewed: chart review for care gap closure-glycemic status assessment.    VBCI Quality Team

## 2024-07-07 DIAGNOSIS — Z791 Long term (current) use of non-steroidal anti-inflammatories (NSAID): Secondary | ICD-10-CM | POA: Diagnosis not present

## 2024-07-08 ENCOUNTER — Ambulatory Visit: Admitting: Gastroenterology

## 2024-07-10 NOTE — Telephone Encounter (Signed)
 NO 2026 RENEWAL FOR OZEMPIC 

## 2024-07-11 ENCOUNTER — Other Ambulatory Visit: Payer: Self-pay

## 2024-07-11 DIAGNOSIS — M62838 Other muscle spasm: Secondary | ICD-10-CM

## 2024-07-14 ENCOUNTER — Other Ambulatory Visit: Payer: Self-pay

## 2024-07-14 DIAGNOSIS — F411 Generalized anxiety disorder: Secondary | ICD-10-CM

## 2024-07-17 DIAGNOSIS — G8929 Other chronic pain: Secondary | ICD-10-CM | POA: Diagnosis not present

## 2024-07-17 DIAGNOSIS — Z7984 Long term (current) use of oral hypoglycemic drugs: Secondary | ICD-10-CM | POA: Diagnosis not present

## 2024-07-17 DIAGNOSIS — M19041 Primary osteoarthritis, right hand: Secondary | ICD-10-CM | POA: Diagnosis not present

## 2024-07-17 DIAGNOSIS — M19042 Primary osteoarthritis, left hand: Secondary | ICD-10-CM | POA: Diagnosis not present

## 2024-07-17 DIAGNOSIS — M1712 Unilateral primary osteoarthritis, left knee: Secondary | ICD-10-CM | POA: Diagnosis not present

## 2024-07-17 DIAGNOSIS — Z791 Long term (current) use of non-steroidal anti-inflammatories (NSAID): Secondary | ICD-10-CM | POA: Diagnosis not present

## 2024-07-17 DIAGNOSIS — I10 Essential (primary) hypertension: Secondary | ICD-10-CM | POA: Diagnosis not present

## 2024-07-17 DIAGNOSIS — J45909 Unspecified asthma, uncomplicated: Secondary | ICD-10-CM | POA: Diagnosis not present

## 2024-07-17 DIAGNOSIS — F411 Generalized anxiety disorder: Secondary | ICD-10-CM | POA: Diagnosis not present

## 2024-07-17 DIAGNOSIS — Z9181 History of falling: Secondary | ICD-10-CM | POA: Diagnosis not present

## 2024-07-17 DIAGNOSIS — Z7985 Long-term (current) use of injectable non-insulin antidiabetic drugs: Secondary | ICD-10-CM | POA: Diagnosis not present

## 2024-07-17 DIAGNOSIS — F319 Bipolar disorder, unspecified: Secondary | ICD-10-CM | POA: Diagnosis not present

## 2024-07-17 DIAGNOSIS — K219 Gastro-esophageal reflux disease without esophagitis: Secondary | ICD-10-CM | POA: Diagnosis not present

## 2024-07-17 DIAGNOSIS — M160 Bilateral primary osteoarthritis of hip: Secondary | ICD-10-CM | POA: Diagnosis not present

## 2024-07-17 DIAGNOSIS — E119 Type 2 diabetes mellitus without complications: Secondary | ICD-10-CM | POA: Diagnosis not present

## 2024-07-17 DIAGNOSIS — G4733 Obstructive sleep apnea (adult) (pediatric): Secondary | ICD-10-CM | POA: Diagnosis not present

## 2024-07-20 ENCOUNTER — Other Ambulatory Visit: Payer: Self-pay

## 2024-07-20 DIAGNOSIS — E119 Type 2 diabetes mellitus without complications: Secondary | ICD-10-CM | POA: Diagnosis not present

## 2024-07-20 DIAGNOSIS — G4733 Obstructive sleep apnea (adult) (pediatric): Secondary | ICD-10-CM | POA: Diagnosis not present

## 2024-07-20 DIAGNOSIS — F411 Generalized anxiety disorder: Secondary | ICD-10-CM | POA: Diagnosis not present

## 2024-07-20 DIAGNOSIS — I10 Essential (primary) hypertension: Secondary | ICD-10-CM | POA: Diagnosis not present

## 2024-07-20 DIAGNOSIS — M1712 Unilateral primary osteoarthritis, left knee: Secondary | ICD-10-CM | POA: Diagnosis not present

## 2024-07-20 DIAGNOSIS — M160 Bilateral primary osteoarthritis of hip: Secondary | ICD-10-CM | POA: Diagnosis not present

## 2024-07-20 DIAGNOSIS — G8929 Other chronic pain: Secondary | ICD-10-CM | POA: Diagnosis not present

## 2024-07-20 DIAGNOSIS — Z7984 Long term (current) use of oral hypoglycemic drugs: Secondary | ICD-10-CM | POA: Diagnosis not present

## 2024-07-20 DIAGNOSIS — Z791 Long term (current) use of non-steroidal anti-inflammatories (NSAID): Secondary | ICD-10-CM | POA: Diagnosis not present

## 2024-07-20 DIAGNOSIS — F319 Bipolar disorder, unspecified: Secondary | ICD-10-CM | POA: Diagnosis not present

## 2024-07-20 DIAGNOSIS — Z9181 History of falling: Secondary | ICD-10-CM | POA: Diagnosis not present

## 2024-07-20 DIAGNOSIS — K219 Gastro-esophageal reflux disease without esophagitis: Secondary | ICD-10-CM | POA: Diagnosis not present

## 2024-07-20 DIAGNOSIS — J45909 Unspecified asthma, uncomplicated: Secondary | ICD-10-CM | POA: Diagnosis not present

## 2024-07-20 DIAGNOSIS — M19042 Primary osteoarthritis, left hand: Secondary | ICD-10-CM | POA: Diagnosis not present

## 2024-07-20 DIAGNOSIS — M545 Low back pain, unspecified: Secondary | ICD-10-CM

## 2024-07-20 DIAGNOSIS — Z7985 Long-term (current) use of injectable non-insulin antidiabetic drugs: Secondary | ICD-10-CM | POA: Diagnosis not present

## 2024-07-20 DIAGNOSIS — M19041 Primary osteoarthritis, right hand: Secondary | ICD-10-CM | POA: Diagnosis not present

## 2024-07-21 ENCOUNTER — Other Ambulatory Visit: Payer: Self-pay

## 2024-07-21 DIAGNOSIS — F319 Bipolar disorder, unspecified: Secondary | ICD-10-CM

## 2024-07-21 DIAGNOSIS — M62838 Other muscle spasm: Secondary | ICD-10-CM

## 2024-07-21 DIAGNOSIS — E119 Type 2 diabetes mellitus without complications: Secondary | ICD-10-CM

## 2024-07-23 ENCOUNTER — Ambulatory Visit (INDEPENDENT_AMBULATORY_CARE_PROVIDER_SITE_OTHER)

## 2024-07-23 VITALS — BP 119/87 | HR 87 | Ht 61.0 in | Wt 289.1 lb

## 2024-07-23 DIAGNOSIS — M65342 Trigger finger, left ring finger: Secondary | ICD-10-CM | POA: Diagnosis not present

## 2024-07-23 DIAGNOSIS — K219 Gastro-esophageal reflux disease without esophagitis: Secondary | ICD-10-CM

## 2024-07-23 DIAGNOSIS — R5381 Other malaise: Secondary | ICD-10-CM

## 2024-07-23 DIAGNOSIS — E1165 Type 2 diabetes mellitus with hyperglycemia: Secondary | ICD-10-CM | POA: Diagnosis not present

## 2024-07-23 DIAGNOSIS — E119 Type 2 diabetes mellitus without complications: Secondary | ICD-10-CM

## 2024-07-23 DIAGNOSIS — Z7409 Other reduced mobility: Secondary | ICD-10-CM

## 2024-07-23 DIAGNOSIS — M65351 Trigger finger, right little finger: Secondary | ICD-10-CM | POA: Diagnosis not present

## 2024-07-23 LAB — POCT GLYCOSYLATED HEMOGLOBIN (HGB A1C): HbA1c, POC (controlled diabetic range): 6.8 % (ref 0.0–7.0)

## 2024-07-23 MED ORDER — FAMOTIDINE 20 MG PO TABS: 20.0000 mg | ORAL_TABLET | Freq: Two times a day (BID) | ORAL | 6 refills | Status: AC

## 2024-07-23 NOTE — Progress Notes (Unsigned)
    SUBJECTIVE:   CHIEF COMPLAINT / HPI: Fall  Tizandine  4 mg morning and night time.   Fell 3 weeks ago. Directly on knee.   Trizepitide. Koval ozempic .   Pepcid  refill.   Cologuard.   Specialist - esophagus GI, fingers - trigger finger left 4th digit, r 5th digit   PERTINENT  PMH / PSH: ***  OBJECTIVE:   BP 119/87   Pulse 87   Ht 5' 1 (1.549 m)   Wt 289 lb 2 oz (131.1 kg)   LMP 01/17/2014   SpO2 95%   BMI 54.63 kg/m   ***  ASSESSMENT/PLAN:   Assessment & Plan Type 2 diabetes mellitus with hyperglycemia, without long-term current use of insulin (HCC)  Gastroesophageal reflux disease, unspecified whether esophagitis present      Amanda KANDICE Lee, DO [facility]

## 2024-07-23 NOTE — Patient Instructions (Addendum)
 I have sent in a refill of your famotidine  and your other medications that were previously requested. We will complete the paperwork in order for you to get a wheelchair.   For potentially switching your Ozempic , I will have to talk with our pharmacy team which may take some time. We will continue the Ozempic  for now and I will update you when I find out if we are able to switch the medication.  The referral you missed the appointment of was for a Hand Specialist. The referral is still in the system, and their information is below. You can call them to make the appointment for whenever is best for you.   [facility] [address] [place], [place] [address] [phone]   December 22nd at 11:00 AM.   I would like to see you back in 3 months.

## 2024-07-24 LAB — MICROALBUMIN / CREATININE URINE RATIO
Creatinine, Urine: 337.6 mg/dL
Microalb/Creat Ratio: 24 mg/g{creat} (ref 0–29)
Microalbumin, Urine: 79.7 ug/mL

## 2024-07-24 NOTE — Assessment & Plan Note (Signed)
 Patient with previous referral to hand Center.  Referral still in the system. - Gave patient hand center phone number and information, and advised her to call make an appointment

## 2024-07-24 NOTE — Assessment & Plan Note (Signed)
 A1c 6.8 today.  Patient tolerating her Ozempic  and metformin  well. - Gave patient her Ozempic  - Refilled metformin  today - Discussed potentially switching to tirzepatide, informed patient we will have to discuss this with our pharmacy team here - Urine albumin creatinine collected - Patient left before collecting labs, ordered future BMP and lipid panel, called patient and advised her to call and make a lab appointment for these

## 2024-07-24 NOTE — Assessment & Plan Note (Signed)
Pepcid refilled

## 2024-07-27 ENCOUNTER — Other Ambulatory Visit

## 2024-07-27 DIAGNOSIS — Z791 Long term (current) use of non-steroidal anti-inflammatories (NSAID): Secondary | ICD-10-CM | POA: Diagnosis not present

## 2024-07-27 DIAGNOSIS — M19042 Primary osteoarthritis, left hand: Secondary | ICD-10-CM | POA: Diagnosis not present

## 2024-07-27 DIAGNOSIS — J45909 Unspecified asthma, uncomplicated: Secondary | ICD-10-CM | POA: Diagnosis not present

## 2024-07-27 DIAGNOSIS — Z7985 Long-term (current) use of injectable non-insulin antidiabetic drugs: Secondary | ICD-10-CM | POA: Diagnosis not present

## 2024-07-27 DIAGNOSIS — I10 Essential (primary) hypertension: Secondary | ICD-10-CM | POA: Diagnosis not present

## 2024-07-27 DIAGNOSIS — E119 Type 2 diabetes mellitus without complications: Secondary | ICD-10-CM | POA: Diagnosis not present

## 2024-07-27 DIAGNOSIS — G8929 Other chronic pain: Secondary | ICD-10-CM | POA: Diagnosis not present

## 2024-07-27 DIAGNOSIS — K219 Gastro-esophageal reflux disease without esophagitis: Secondary | ICD-10-CM | POA: Diagnosis not present

## 2024-07-27 DIAGNOSIS — Z9181 History of falling: Secondary | ICD-10-CM | POA: Diagnosis not present

## 2024-07-27 DIAGNOSIS — F319 Bipolar disorder, unspecified: Secondary | ICD-10-CM | POA: Diagnosis not present

## 2024-07-27 DIAGNOSIS — G4733 Obstructive sleep apnea (adult) (pediatric): Secondary | ICD-10-CM | POA: Diagnosis not present

## 2024-07-27 DIAGNOSIS — Z7984 Long term (current) use of oral hypoglycemic drugs: Secondary | ICD-10-CM | POA: Diagnosis not present

## 2024-07-27 DIAGNOSIS — F411 Generalized anxiety disorder: Secondary | ICD-10-CM | POA: Diagnosis not present

## 2024-07-27 DIAGNOSIS — M160 Bilateral primary osteoarthritis of hip: Secondary | ICD-10-CM | POA: Diagnosis not present

## 2024-07-27 DIAGNOSIS — M19041 Primary osteoarthritis, right hand: Secondary | ICD-10-CM | POA: Diagnosis not present

## 2024-07-27 DIAGNOSIS — M1712 Unilateral primary osteoarthritis, left knee: Secondary | ICD-10-CM | POA: Diagnosis not present

## 2024-07-29 ENCOUNTER — Telehealth: Payer: Self-pay | Admitting: Family Medicine

## 2024-07-29 ENCOUNTER — Other Ambulatory Visit: Payer: Self-pay

## 2024-07-29 ENCOUNTER — Other Ambulatory Visit: Payer: Self-pay | Admitting: Family Medicine

## 2024-07-29 ENCOUNTER — Ambulatory Visit: Payer: Self-pay

## 2024-07-29 DIAGNOSIS — E119 Type 2 diabetes mellitus without complications: Secondary | ICD-10-CM

## 2024-07-29 DIAGNOSIS — M5441 Lumbago with sciatica, right side: Secondary | ICD-10-CM

## 2024-07-29 DIAGNOSIS — M1712 Unilateral primary osteoarthritis, left knee: Secondary | ICD-10-CM

## 2024-07-29 DIAGNOSIS — E785 Hyperlipidemia, unspecified: Secondary | ICD-10-CM

## 2024-07-29 NOTE — Telephone Encounter (Signed)
 Patient has need for DME. I have ordered manual wheelchair. I am routing note for [facility] RN Pool.   Suzann CHRISTELLA Daring, MD

## 2024-07-30 NOTE — Telephone Encounter (Signed)
 Community message sent to [employer]. Will await response.   Veronda Prude, RN

## 2024-07-30 NOTE — Telephone Encounter (Signed)
 Please see the following message from [employer].   Tucker, Dolanda  Tucker, Dolanda; Labrian Torregrossa, Chiquita BROCKS, RN; Joylene Carlean Sheree Glatter there is no documentation or notes stating mobility issues or need for the wc  If appropriate, please addend OV note documenting the medical need of device.   Thanks.   Chiquita BROCKS English, RN

## 2024-07-31 ENCOUNTER — Other Ambulatory Visit: Payer: Self-pay

## 2024-07-31 DIAGNOSIS — G8929 Other chronic pain: Secondary | ICD-10-CM

## 2024-07-31 NOTE — Progress Notes (Addendum)
    SUBJECTIVE:   CHIEF COMPLAINT / HPI: wheelchair need  Patient was recommended to have a wheelchair and grab bars by PT. She has difficulty getting around due to chronic pain and physical deconditioning. She currently gets around with a rollator walker.   PERTINENT  PMH / PSH: OA of left knee, chronic low back pain   OBJECTIVE:   BP 119/87   Pulse 87   Ht 5' 1 (1.549 m)   Wt 289 lb 2 oz (131.1 kg)   LMP 01/17/2014   SpO2 95%   BMI 54.63 kg/m  - vitals from visit on 07/23/24 General: ambulates with rollator walker   ASSESSMENT/PLAN:   Assessment & Plan Mobility impaired Physical deconditioning Physically deconditioned in the setting of osteoarthritis and chronic pain.  Patient also with a fall 3 weeks ago.  Receiving PT. - DME order for wheelchair - Order for grab bars     Raguel KANDICE Lee, DO [facility]

## 2024-08-03 DIAGNOSIS — Z7984 Long term (current) use of oral hypoglycemic drugs: Secondary | ICD-10-CM | POA: Diagnosis not present

## 2024-08-03 DIAGNOSIS — Z7985 Long-term (current) use of injectable non-insulin antidiabetic drugs: Secondary | ICD-10-CM | POA: Diagnosis not present

## 2024-08-03 DIAGNOSIS — F411 Generalized anxiety disorder: Secondary | ICD-10-CM | POA: Diagnosis not present

## 2024-08-03 DIAGNOSIS — K219 Gastro-esophageal reflux disease without esophagitis: Secondary | ICD-10-CM | POA: Diagnosis not present

## 2024-08-03 DIAGNOSIS — M19042 Primary osteoarthritis, left hand: Secondary | ICD-10-CM | POA: Diagnosis not present

## 2024-08-03 DIAGNOSIS — G4733 Obstructive sleep apnea (adult) (pediatric): Secondary | ICD-10-CM | POA: Diagnosis not present

## 2024-08-03 DIAGNOSIS — M160 Bilateral primary osteoarthritis of hip: Secondary | ICD-10-CM | POA: Diagnosis not present

## 2024-08-03 DIAGNOSIS — I10 Essential (primary) hypertension: Secondary | ICD-10-CM | POA: Diagnosis not present

## 2024-08-03 DIAGNOSIS — Z9181 History of falling: Secondary | ICD-10-CM | POA: Diagnosis not present

## 2024-08-03 DIAGNOSIS — J45909 Unspecified asthma, uncomplicated: Secondary | ICD-10-CM | POA: Diagnosis not present

## 2024-08-03 DIAGNOSIS — M1712 Unilateral primary osteoarthritis, left knee: Secondary | ICD-10-CM | POA: Diagnosis not present

## 2024-08-03 DIAGNOSIS — F319 Bipolar disorder, unspecified: Secondary | ICD-10-CM | POA: Diagnosis not present

## 2024-08-03 DIAGNOSIS — M19041 Primary osteoarthritis, right hand: Secondary | ICD-10-CM | POA: Diagnosis not present

## 2024-08-03 DIAGNOSIS — Z791 Long term (current) use of non-steroidal anti-inflammatories (NSAID): Secondary | ICD-10-CM | POA: Diagnosis not present

## 2024-08-03 DIAGNOSIS — E119 Type 2 diabetes mellitus without complications: Secondary | ICD-10-CM | POA: Diagnosis not present

## 2024-08-03 DIAGNOSIS — G8929 Other chronic pain: Secondary | ICD-10-CM | POA: Diagnosis not present

## 2024-08-04 ENCOUNTER — Other Ambulatory Visit: Payer: Self-pay

## 2024-08-04 DIAGNOSIS — R2681 Unsteadiness on feet: Secondary | ICD-10-CM

## 2024-08-04 DIAGNOSIS — M545 Low back pain, unspecified: Secondary | ICD-10-CM

## 2024-08-04 NOTE — Telephone Encounter (Signed)
 Reviewed PDMP with Dr. Tharon.

## 2024-08-10 DIAGNOSIS — I10 Essential (primary) hypertension: Secondary | ICD-10-CM | POA: Diagnosis not present

## 2024-08-10 DIAGNOSIS — J45909 Unspecified asthma, uncomplicated: Secondary | ICD-10-CM | POA: Diagnosis not present

## 2024-08-10 DIAGNOSIS — K219 Gastro-esophageal reflux disease without esophagitis: Secondary | ICD-10-CM | POA: Diagnosis not present

## 2024-08-10 DIAGNOSIS — F411 Generalized anxiety disorder: Secondary | ICD-10-CM | POA: Diagnosis not present

## 2024-08-10 DIAGNOSIS — F319 Bipolar disorder, unspecified: Secondary | ICD-10-CM | POA: Diagnosis not present

## 2024-08-10 DIAGNOSIS — E119 Type 2 diabetes mellitus without complications: Secondary | ICD-10-CM | POA: Diagnosis not present

## 2024-08-10 DIAGNOSIS — G8929 Other chronic pain: Secondary | ICD-10-CM | POA: Diagnosis not present

## 2024-08-10 DIAGNOSIS — M1712 Unilateral primary osteoarthritis, left knee: Secondary | ICD-10-CM | POA: Diagnosis not present

## 2024-08-10 DIAGNOSIS — Z7984 Long term (current) use of oral hypoglycemic drugs: Secondary | ICD-10-CM | POA: Diagnosis not present

## 2024-08-10 DIAGNOSIS — M19042 Primary osteoarthritis, left hand: Secondary | ICD-10-CM | POA: Diagnosis not present

## 2024-08-10 DIAGNOSIS — Z791 Long term (current) use of non-steroidal anti-inflammatories (NSAID): Secondary | ICD-10-CM | POA: Diagnosis not present

## 2024-08-10 DIAGNOSIS — G4733 Obstructive sleep apnea (adult) (pediatric): Secondary | ICD-10-CM | POA: Diagnosis not present

## 2024-08-10 DIAGNOSIS — M19041 Primary osteoarthritis, right hand: Secondary | ICD-10-CM | POA: Diagnosis not present

## 2024-08-10 DIAGNOSIS — M160 Bilateral primary osteoarthritis of hip: Secondary | ICD-10-CM | POA: Diagnosis not present

## 2024-08-10 DIAGNOSIS — Z7985 Long-term (current) use of injectable non-insulin antidiabetic drugs: Secondary | ICD-10-CM | POA: Diagnosis not present

## 2024-08-10 DIAGNOSIS — Z9181 History of falling: Secondary | ICD-10-CM | POA: Diagnosis not present

## 2024-08-11 ENCOUNTER — Other Ambulatory Visit: Payer: Self-pay

## 2024-08-11 DIAGNOSIS — M545 Low back pain, unspecified: Secondary | ICD-10-CM

## 2024-08-11 NOTE — Addendum Note (Signed)
 Addended by: CORALEE ELDER on: 08/11/2024 10:15 AM   Modules accepted: Orders

## 2024-08-18 ENCOUNTER — Other Ambulatory Visit: Payer: Self-pay

## 2024-08-18 DIAGNOSIS — F411 Generalized anxiety disorder: Secondary | ICD-10-CM

## 2024-08-20 NOTE — Progress Notes (Signed)
 Amanda Larson                                          MRN: [id number]   08/20/2024   The [facility] Quality Team Specialist reviewed this patient medical record for the purposes of chart review for care gap closure. The following were reviewed: abstraction for care gap closure-glycemic status assessment.    [facility] Quality Team

## 2024-08-21 NOTE — Progress Notes (Signed)
 LEGACY CARRENDER                                          MRN: [id number]   08/21/2024   The [facility] Quality Team Specialist reviewed this patient medical record for the purposes of chart review for care gap closure. The following were reviewed: abstraction for care gap closure-kidney health evaluation for diabetes:eGFR  and uACR.    [facility] Quality Team

## 2024-08-23 ENCOUNTER — Other Ambulatory Visit: Payer: Self-pay

## 2024-08-23 DIAGNOSIS — M62838 Other muscle spasm: Secondary | ICD-10-CM

## 2024-08-31 ENCOUNTER — Ambulatory Visit: Admitting: Gastroenterology

## 2024-09-06 ENCOUNTER — Other Ambulatory Visit: Payer: Self-pay

## 2024-09-06 DIAGNOSIS — M545 Low back pain, unspecified: Secondary | ICD-10-CM

## 2024-09-08 ENCOUNTER — Other Ambulatory Visit: Payer: Self-pay

## 2024-09-08 DIAGNOSIS — M545 Low back pain, unspecified: Secondary | ICD-10-CM

## 2024-09-16 ENCOUNTER — Other Ambulatory Visit: Payer: Self-pay

## 2024-09-16 ENCOUNTER — Other Ambulatory Visit: Payer: Self-pay | Admitting: Family Medicine

## 2024-09-16 DIAGNOSIS — M545 Low back pain, unspecified: Secondary | ICD-10-CM

## 2024-09-16 NOTE — Progress Notes (Signed)
 Opened to review PDMP with Dr. Lennie

## 2024-09-16 NOTE — Telephone Encounter (Signed)
 PDMP reviewed with Dr. Stoney.

## 2024-09-16 NOTE — Telephone Encounter (Signed)
 Patient calls nurse line regarding tramadol  refill. She reports that she is currently out and is confused why last refill request was denied.   Forwarding to PCP.   Amanda JAYSON English, RN

## 2024-09-19 ENCOUNTER — Other Ambulatory Visit: Payer: Self-pay

## 2024-09-19 DIAGNOSIS — F319 Bipolar disorder, unspecified: Secondary | ICD-10-CM

## 2024-09-19 DIAGNOSIS — F411 Generalized anxiety disorder: Secondary | ICD-10-CM

## 2024-09-27 ENCOUNTER — Other Ambulatory Visit: Payer: Self-pay

## 2024-09-27 DIAGNOSIS — M62838 Other muscle spasm: Secondary | ICD-10-CM

## 2024-10-05 NOTE — Progress Notes (Signed)
 Amanda Larson                                          MRN: [id number]   10/05/2024   The [facility] Quality Team Specialist reviewed this patient medical record for the purposes of chart review for care gap closure. The following were reviewed: chart review for care gap closure-glycemic status assessment.    [facility]BCI Quality Team

## 2024-10-06 ENCOUNTER — Other Ambulatory Visit: Payer: Self-pay

## 2024-10-06 DIAGNOSIS — M545 Low back pain, unspecified: Secondary | ICD-10-CM

## 2024-11-23 ENCOUNTER — Encounter
